# Patient Record
Sex: Female | Born: 1975 | Race: White | Hispanic: No | Marital: Married | Smoking: Never smoker
Health system: Southern US, Community
[De-identification: ages and names within clinical notes are randomized; demographics above are authoritative.]

## PROBLEM LIST (undated history)

## (undated) DIAGNOSIS — E0590 Thyrotoxicosis, unspecified without thyrotoxic crisis or storm: Secondary | ICD-10-CM

## (undated) DIAGNOSIS — E7800 Pure hypercholesterolemia, unspecified: Secondary | ICD-10-CM

## (undated) DIAGNOSIS — R112 Nausea with vomiting, unspecified: Secondary | ICD-10-CM

## (undated) DIAGNOSIS — K219 Gastro-esophageal reflux disease without esophagitis: Secondary | ICD-10-CM

## (undated) DIAGNOSIS — R7303 Prediabetes: Secondary | ICD-10-CM

## (undated) DIAGNOSIS — E039 Hypothyroidism, unspecified: Secondary | ICD-10-CM

## (undated) DIAGNOSIS — Z9889 Other specified postprocedural states: Secondary | ICD-10-CM

## (undated) DIAGNOSIS — F32A Depression, unspecified: Secondary | ICD-10-CM

## (undated) DIAGNOSIS — R233 Spontaneous ecchymoses: Secondary | ICD-10-CM

## (undated) DIAGNOSIS — M1990 Unspecified osteoarthritis, unspecified site: Secondary | ICD-10-CM

## (undated) DIAGNOSIS — G43909 Migraine, unspecified, not intractable, without status migrainosus: Secondary | ICD-10-CM

## (undated) DIAGNOSIS — K760 Fatty (change of) liver, not elsewhere classified: Secondary | ICD-10-CM

## (undated) DIAGNOSIS — F329 Major depressive disorder, single episode, unspecified: Secondary | ICD-10-CM

## (undated) DIAGNOSIS — E282 Polycystic ovarian syndrome: Secondary | ICD-10-CM

## (undated) DIAGNOSIS — R238 Other skin changes: Secondary | ICD-10-CM

## (undated) HISTORY — PX: CHOLECYSTECTOMY: SHX55

## (undated) HISTORY — DX: Polycystic ovarian syndrome: E28.2

## (undated) HISTORY — PX: DILATION AND CURETTAGE OF UTERUS: SHX78

## (undated) HISTORY — PX: ABLATION: SHX5711

## (undated) HISTORY — PX: EYE SURGERY: SHX253

## (undated) HISTORY — PX: CARPAL TUNNEL RELEASE: SHX101

## (undated) HISTORY — DX: Spontaneous ecchymoses: R23.3

## (undated) HISTORY — DX: Pure hypercholesterolemia, unspecified: E78.00

## (undated) HISTORY — DX: Other skin changes: R23.8

## (undated) HISTORY — PX: BACK SURGERY: SHX140

---
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
## 2007-04-11 HISTORY — PX: CHOLECYSTECTOMY: SHX55

## 2011-12-09 HISTORY — PX: DILATION AND CURETTAGE OF UTERUS: SHX78

## 2017-03-11 DIAGNOSIS — D242 Benign neoplasm of left breast: Secondary | ICD-10-CM | POA: Diagnosis not present

## 2017-03-11 DIAGNOSIS — N6323 Unspecified lump in the left breast, lower outer quadrant: Secondary | ICD-10-CM | POA: Diagnosis not present

## 2017-03-20 DIAGNOSIS — G43909 Migraine, unspecified, not intractable, without status migrainosus: Secondary | ICD-10-CM | POA: Diagnosis not present

## 2017-04-08 DIAGNOSIS — E039 Hypothyroidism, unspecified: Secondary | ICD-10-CM | POA: Diagnosis not present

## 2017-05-06 DIAGNOSIS — R197 Diarrhea, unspecified: Secondary | ICD-10-CM | POA: Diagnosis not present

## 2017-05-26 DIAGNOSIS — Z6841 Body Mass Index (BMI) 40.0 and over, adult: Secondary | ICD-10-CM | POA: Diagnosis not present

## 2017-05-26 DIAGNOSIS — N926 Irregular menstruation, unspecified: Secondary | ICD-10-CM | POA: Diagnosis not present

## 2017-06-03 DIAGNOSIS — D242 Benign neoplasm of left breast: Secondary | ICD-10-CM | POA: Diagnosis not present

## 2017-06-03 DIAGNOSIS — Z6841 Body Mass Index (BMI) 40.0 and over, adult: Secondary | ICD-10-CM | POA: Diagnosis not present

## 2017-06-16 DIAGNOSIS — G43009 Migraine without aura, not intractable, without status migrainosus: Secondary | ICD-10-CM | POA: Diagnosis not present

## 2017-07-02 DIAGNOSIS — N644 Mastodynia: Secondary | ICD-10-CM | POA: Diagnosis not present

## 2017-07-08 DIAGNOSIS — Z3202 Encounter for pregnancy test, result negative: Secondary | ICD-10-CM | POA: Diagnosis not present

## 2017-07-08 DIAGNOSIS — N926 Irregular menstruation, unspecified: Secondary | ICD-10-CM | POA: Diagnosis not present

## 2017-07-13 DIAGNOSIS — E039 Hypothyroidism, unspecified: Secondary | ICD-10-CM | POA: Diagnosis not present

## 2017-07-13 DIAGNOSIS — Z202 Contact with and (suspected) exposure to infections with a predominantly sexual mode of transmission: Secondary | ICD-10-CM | POA: Diagnosis not present

## 2017-07-15 DIAGNOSIS — N644 Mastodynia: Secondary | ICD-10-CM | POA: Diagnosis not present

## 2017-07-15 DIAGNOSIS — N6001 Solitary cyst of right breast: Secondary | ICD-10-CM | POA: Diagnosis not present

## 2017-07-15 DIAGNOSIS — N6011 Diffuse cystic mastopathy of right breast: Secondary | ICD-10-CM | POA: Diagnosis not present

## 2017-07-15 DIAGNOSIS — R928 Other abnormal and inconclusive findings on diagnostic imaging of breast: Secondary | ICD-10-CM | POA: Diagnosis not present

## 2017-07-23 DIAGNOSIS — Z09 Encounter for follow-up examination after completed treatment for conditions other than malignant neoplasm: Secondary | ICD-10-CM | POA: Diagnosis not present

## 2017-07-23 DIAGNOSIS — N926 Irregular menstruation, unspecified: Secondary | ICD-10-CM | POA: Diagnosis not present

## 2017-08-01 DIAGNOSIS — G43919 Migraine, unspecified, intractable, without status migrainosus: Secondary | ICD-10-CM | POA: Diagnosis not present

## 2017-08-18 DIAGNOSIS — D519 Vitamin B12 deficiency anemia, unspecified: Secondary | ICD-10-CM | POA: Diagnosis not present

## 2017-08-18 DIAGNOSIS — E559 Vitamin D deficiency, unspecified: Secondary | ICD-10-CM | POA: Diagnosis not present

## 2017-08-18 DIAGNOSIS — R5383 Other fatigue: Secondary | ICD-10-CM | POA: Diagnosis not present

## 2017-09-14 DIAGNOSIS — M25571 Pain in right ankle and joints of right foot: Secondary | ICD-10-CM | POA: Diagnosis not present

## 2017-10-08 DIAGNOSIS — E039 Hypothyroidism, unspecified: Secondary | ICD-10-CM | POA: Diagnosis not present

## 2017-10-08 DIAGNOSIS — E559 Vitamin D deficiency, unspecified: Secondary | ICD-10-CM | POA: Diagnosis not present

## 2017-10-10 DIAGNOSIS — M545 Low back pain: Secondary | ICD-10-CM | POA: Diagnosis not present

## 2017-10-12 DIAGNOSIS — M545 Low back pain: Secondary | ICD-10-CM | POA: Diagnosis not present

## 2017-10-12 DIAGNOSIS — M25552 Pain in left hip: Secondary | ICD-10-CM | POA: Diagnosis not present

## 2017-10-12 DIAGNOSIS — E039 Hypothyroidism, unspecified: Secondary | ICD-10-CM | POA: Diagnosis not present

## 2017-10-12 DIAGNOSIS — Z9049 Acquired absence of other specified parts of digestive tract: Secondary | ICD-10-CM | POA: Diagnosis not present

## 2017-10-12 DIAGNOSIS — M7072 Other bursitis of hip, left hip: Secondary | ICD-10-CM | POA: Diagnosis not present

## 2017-10-12 DIAGNOSIS — Z79899 Other long term (current) drug therapy: Secondary | ICD-10-CM | POA: Diagnosis not present

## 2017-10-12 DIAGNOSIS — M5137 Other intervertebral disc degeneration, lumbosacral region: Secondary | ICD-10-CM | POA: Diagnosis not present

## 2017-10-12 DIAGNOSIS — Z7984 Long term (current) use of oral hypoglycemic drugs: Secondary | ICD-10-CM | POA: Diagnosis not present

## 2017-10-12 DIAGNOSIS — E282 Polycystic ovarian syndrome: Secondary | ICD-10-CM | POA: Diagnosis not present

## 2017-10-12 DIAGNOSIS — M5442 Lumbago with sciatica, left side: Secondary | ICD-10-CM | POA: Diagnosis not present

## 2017-10-13 DIAGNOSIS — M5432 Sciatica, left side: Secondary | ICD-10-CM | POA: Diagnosis not present

## 2017-10-13 DIAGNOSIS — M545 Low back pain: Secondary | ICD-10-CM | POA: Diagnosis not present

## 2017-10-14 DIAGNOSIS — S338XXA Sprain of other parts of lumbar spine and pelvis, initial encounter: Secondary | ICD-10-CM | POA: Diagnosis not present

## 2017-10-14 DIAGNOSIS — S134XXA Sprain of ligaments of cervical spine, initial encounter: Secondary | ICD-10-CM | POA: Diagnosis not present

## 2017-10-14 DIAGNOSIS — S233XXA Sprain of ligaments of thoracic spine, initial encounter: Secondary | ICD-10-CM | POA: Diagnosis not present

## 2017-10-15 DIAGNOSIS — S134XXA Sprain of ligaments of cervical spine, initial encounter: Secondary | ICD-10-CM | POA: Diagnosis not present

## 2017-10-15 DIAGNOSIS — S338XXA Sprain of other parts of lumbar spine and pelvis, initial encounter: Secondary | ICD-10-CM | POA: Diagnosis not present

## 2017-10-15 DIAGNOSIS — S233XXA Sprain of ligaments of thoracic spine, initial encounter: Secondary | ICD-10-CM | POA: Diagnosis not present

## 2017-10-16 DIAGNOSIS — S338XXA Sprain of other parts of lumbar spine and pelvis, initial encounter: Secondary | ICD-10-CM | POA: Diagnosis not present

## 2017-10-16 DIAGNOSIS — S233XXA Sprain of ligaments of thoracic spine, initial encounter: Secondary | ICD-10-CM | POA: Diagnosis not present

## 2017-10-16 DIAGNOSIS — S134XXA Sprain of ligaments of cervical spine, initial encounter: Secondary | ICD-10-CM | POA: Diagnosis not present

## 2017-10-19 DIAGNOSIS — S134XXA Sprain of ligaments of cervical spine, initial encounter: Secondary | ICD-10-CM | POA: Diagnosis not present

## 2017-10-19 DIAGNOSIS — S233XXA Sprain of ligaments of thoracic spine, initial encounter: Secondary | ICD-10-CM | POA: Diagnosis not present

## 2017-10-19 DIAGNOSIS — S338XXA Sprain of other parts of lumbar spine and pelvis, initial encounter: Secondary | ICD-10-CM | POA: Diagnosis not present

## 2017-10-20 DIAGNOSIS — Z79899 Other long term (current) drug therapy: Secondary | ICD-10-CM | POA: Diagnosis not present

## 2017-10-20 DIAGNOSIS — M5442 Lumbago with sciatica, left side: Secondary | ICD-10-CM | POA: Diagnosis not present

## 2017-10-20 DIAGNOSIS — M5126 Other intervertebral disc displacement, lumbar region: Secondary | ICD-10-CM | POA: Diagnosis not present

## 2017-10-20 DIAGNOSIS — M5106 Intervertebral disc disorders with myelopathy, lumbar region: Secondary | ICD-10-CM | POA: Diagnosis not present

## 2017-10-20 DIAGNOSIS — M545 Low back pain: Secondary | ICD-10-CM | POA: Diagnosis not present

## 2017-10-20 DIAGNOSIS — E039 Hypothyroidism, unspecified: Secondary | ICD-10-CM | POA: Diagnosis not present

## 2017-10-20 DIAGNOSIS — M5432 Sciatica, left side: Secondary | ICD-10-CM | POA: Diagnosis not present

## 2017-10-20 DIAGNOSIS — R52 Pain, unspecified: Secondary | ICD-10-CM | POA: Diagnosis not present

## 2017-10-20 DIAGNOSIS — Z7984 Long term (current) use of oral hypoglycemic drugs: Secondary | ICD-10-CM | POA: Diagnosis not present

## 2017-10-22 ENCOUNTER — Ambulatory Visit (HOSPITAL_COMMUNITY): Payer: BLUE CROSS/BLUE SHIELD | Admitting: Certified Registered Nurse Anesthetist

## 2017-10-22 ENCOUNTER — Other Ambulatory Visit: Payer: Self-pay | Admitting: Neurosurgery

## 2017-10-22 ENCOUNTER — Observation Stay (HOSPITAL_COMMUNITY)
Admission: AD | Admit: 2017-10-22 | Discharge: 2017-10-23 | DRG: 519 | Disposition: A | Discharge status: Home / Self Care | Payer: BLUE CROSS/BLUE SHIELD | Attending: Neurosurgery | Admitting: Neurosurgery

## 2017-10-22 ENCOUNTER — Encounter (HOSPITAL_COMMUNITY): Payer: Self-pay

## 2017-10-22 ENCOUNTER — Ambulatory Visit (HOSPITAL_COMMUNITY): Payer: BLUE CROSS/BLUE SHIELD

## 2017-10-22 ENCOUNTER — Encounter (HOSPITAL_COMMUNITY)
Admission: AD | Disposition: A | Discharge status: Home / Self Care | Payer: Self-pay | Source: Home / Self Care | Attending: Neurosurgery

## 2017-10-22 DIAGNOSIS — M48061 Spinal stenosis, lumbar region without neurogenic claudication: Secondary | ICD-10-CM | POA: Insufficient documentation

## 2017-10-22 DIAGNOSIS — E1122 Type 2 diabetes mellitus with diabetic chronic kidney disease: Secondary | ICD-10-CM | POA: Diagnosis not present

## 2017-10-22 DIAGNOSIS — Z79890 Hormone replacement therapy (postmenopausal): Secondary | ICD-10-CM | POA: Insufficient documentation

## 2017-10-22 DIAGNOSIS — Z7952 Long term (current) use of systemic steroids: Secondary | ICD-10-CM | POA: Insufficient documentation

## 2017-10-22 DIAGNOSIS — E669 Obesity, unspecified: Secondary | ICD-10-CM | POA: Diagnosis not present

## 2017-10-22 DIAGNOSIS — Z7984 Long term (current) use of oral hypoglycemic drugs: Secondary | ICD-10-CM | POA: Diagnosis not present

## 2017-10-22 DIAGNOSIS — E119 Type 2 diabetes mellitus without complications: Secondary | ICD-10-CM | POA: Insufficient documentation

## 2017-10-22 DIAGNOSIS — Z79899 Other long term (current) drug therapy: Secondary | ICD-10-CM | POA: Diagnosis not present

## 2017-10-22 DIAGNOSIS — Z79891 Long term (current) use of opiate analgesic: Secondary | ICD-10-CM | POA: Diagnosis not present

## 2017-10-22 DIAGNOSIS — F329 Major depressive disorder, single episode, unspecified: Secondary | ICD-10-CM | POA: Diagnosis not present

## 2017-10-22 DIAGNOSIS — M5126 Other intervertebral disc displacement, lumbar region: Secondary | ICD-10-CM | POA: Diagnosis not present

## 2017-10-22 DIAGNOSIS — M5106 Intervertebral disc disorders with myelopathy, lumbar region: Secondary | ICD-10-CM | POA: Diagnosis not present

## 2017-10-22 DIAGNOSIS — E039 Hypothyroidism, unspecified: Secondary | ICD-10-CM | POA: Insufficient documentation

## 2017-10-22 DIAGNOSIS — G834 Cauda equina syndrome: Secondary | ICD-10-CM | POA: Diagnosis not present

## 2017-10-22 DIAGNOSIS — Z981 Arthrodesis status: Secondary | ICD-10-CM | POA: Diagnosis not present

## 2017-10-22 DIAGNOSIS — Z888 Allergy status to other drugs, medicaments and biological substances status: Secondary | ICD-10-CM | POA: Diagnosis not present

## 2017-10-22 DIAGNOSIS — Z6841 Body Mass Index (BMI) 40.0 and over, adult: Secondary | ICD-10-CM | POA: Diagnosis not present

## 2017-10-22 DIAGNOSIS — G8929 Other chronic pain: Secondary | ICD-10-CM | POA: Diagnosis not present

## 2017-10-22 DIAGNOSIS — Z419 Encounter for procedure for purposes other than remedying health state, unspecified: Secondary | ICD-10-CM

## 2017-10-22 HISTORY — PX: LUMBAR LAMINECTOMY/DECOMPRESSION MICRODISCECTOMY: SHX5026

## 2017-10-22 HISTORY — DX: Major depressive disorder, single episode, unspecified: F32.9

## 2017-10-22 HISTORY — DX: Depression, unspecified: F32.A

## 2017-10-22 HISTORY — DX: Fatty (change of) liver, not elsewhere classified: K76.0

## 2017-10-22 HISTORY — DX: Other specified postprocedural states: R11.2

## 2017-10-22 HISTORY — DX: Nausea with vomiting, unspecified: Z98.890

## 2017-10-22 LAB — BASIC METABOLIC PANEL
Anion gap: 9 (ref 5–15)
BUN: 21 mg/dL — ABNORMAL HIGH (ref 6–20)
CO2: 21 mmol/L — ABNORMAL LOW (ref 22–32)
Calcium: 8.8 mg/dL — ABNORMAL LOW (ref 8.9–10.3)
Chloride: 107 mmol/L (ref 98–111)
Creatinine, Ser: 0.68 mg/dL (ref 0.44–1.00)
GFR calc Af Amer: >60 mL/min (ref >60)
GFR calc non Af Amer: >60 mL/min (ref >60)
Glucose, Bld: 112 mg/dL — ABNORMAL HIGH (ref 70–99)
Potassium: 4.5 mmol/L (ref 3.5–5.1)
Sodium: 137 mmol/L (ref 135–145)

## 2017-10-22 LAB — CBC
HCT: 46.7 % — ABNORMAL HIGH (ref 36.0–46.0)
Hemoglobin: 15 g/dL (ref 12.0–15.0)
MCH: 30.1 pg (ref 26.0–34.0)
MCHC: 32.1 g/dL (ref 30.0–36.0)
MCV: 93.8 fL (ref 78.0–100.0)
Platelets: 306 10*3/uL (ref 150–400)
RBC: 4.98 MIL/uL (ref 3.87–5.11)
RDW: 12.8 % (ref 11.5–15.5)
WBC: 10.2 10*3/uL (ref 4.0–10.5)

## 2017-10-22 LAB — POCT PREGNANCY, URINE: Preg Test, Ur: NEGATIVE

## 2017-10-22 SURGERY — LUMBAR LAMINECTOMY/DECOMPRESSION MICRODISCECTOMY 1 LEVEL
Anesthesia: General | Site: Back | Laterality: Left

## 2017-10-22 MED ORDER — PROPOFOL 10 MG/ML IV BOLUS
INTRAVENOUS | Status: AC
  Filled 2017-10-22: qty 20

## 2017-10-22 MED ORDER — METHOCARBAMOL 1000 MG/10ML IJ SOLN
500.0000 mg | Freq: Four times a day (QID) | INTRAVENOUS | Status: DC | PRN
  Filled 2017-10-22: qty 5

## 2017-10-22 MED ORDER — SUGAMMADEX SODIUM 200 MG/2ML IV SOLN
INTRAVENOUS | Status: DC | PRN
  Administered 2017-10-22: 400 mg via INTRAVENOUS

## 2017-10-22 MED ORDER — SODIUM CHLORIDE 0.9% FLUSH: 3.0000 mL | INTRAVENOUS | Status: DC | PRN

## 2017-10-22 MED ORDER — GABAPENTIN 300 MG PO CAPS
300.0000 mg | ORAL_CAPSULE | Freq: Three times a day (TID) | ORAL | Status: DC
  Administered 2017-10-22 – 2017-10-23 (×2): 300 mg via ORAL
  Filled 2017-10-22 (×2): qty 1

## 2017-10-22 MED ORDER — LIDOCAINE 2% (20 MG/ML) 5 ML SYRINGE
INTRAMUSCULAR | Status: AC
  Filled 2017-10-22: qty 5

## 2017-10-22 MED ORDER — PROMETHAZINE HCL 25 MG/ML IJ SOLN: 6.2500 mg | INTRAMUSCULAR | Status: DC | PRN

## 2017-10-22 MED ORDER — BISACODYL 10 MG RE SUPP: 10.0000 mg | Freq: Every day | RECTAL | Status: DC | PRN

## 2017-10-22 MED ORDER — THROMBIN 5000 UNITS EX SOLR
CUTANEOUS | Status: DC | PRN
  Administered 2017-10-22 (×2): 5000 [IU] via TOPICAL

## 2017-10-22 MED ORDER — ACETAMINOPHEN 325 MG PO TABS: 650.0000 mg | ORAL_TABLET | ORAL | Status: DC | PRN

## 2017-10-22 MED ORDER — VANCOMYCIN HCL 10 G IV SOLR
1250.0000 mg | Freq: Once | INTRAVENOUS | Status: DC
  Filled 2017-10-22: qty 1250

## 2017-10-22 MED ORDER — OXYCODONE HCL 5 MG PO TABS
5.0000 mg | ORAL_TABLET | ORAL | Status: DC | PRN
  Administered 2017-10-23: 5 mg via ORAL
  Filled 2017-10-22: qty 1

## 2017-10-22 MED ORDER — ONDANSETRON HCL 4 MG/2ML IJ SOLN
INTRAMUSCULAR | Status: DC | PRN
  Administered 2017-10-22: 4 mg via INTRAVENOUS

## 2017-10-22 MED ORDER — SODIUM CHLORIDE 0.9 % IV SOLN: 250.0000 mL | INTRAVENOUS | Status: DC

## 2017-10-22 MED ORDER — LACTATED RINGERS IV SOLN
INTRAVENOUS | Status: DC | PRN
  Administered 2017-10-22 (×2): via INTRAVENOUS

## 2017-10-22 MED ORDER — HEMOSTATIC AGENTS (NO CHARGE) OPTIME
TOPICAL | Status: DC | PRN
  Administered 2017-10-22: 1 via TOPICAL

## 2017-10-22 MED ORDER — MENTHOL 3 MG MT LOZG
1.0000 | LOZENGE | OROMUCOSAL | Status: DC | PRN
  Filled 2017-10-22: qty 9

## 2017-10-22 MED ORDER — ROCURONIUM BROMIDE 100 MG/10ML IV SOLN
INTRAVENOUS | Status: DC | PRN
  Administered 2017-10-22: 20 mg via INTRAVENOUS
  Administered 2017-10-22: 60 mg via INTRAVENOUS

## 2017-10-22 MED ORDER — SODIUM CHLORIDE 0.9% FLUSH
3.0000 mL | Freq: Two times a day (BID) | INTRAVENOUS | Status: DC
  Administered 2017-10-22: 3 mL via INTRAVENOUS

## 2017-10-22 MED ORDER — ONDANSETRON HCL 4 MG/2ML IJ SOLN
INTRAMUSCULAR | Status: AC
  Filled 2017-10-22: qty 2

## 2017-10-22 MED ORDER — THROMBIN 5000 UNITS EX SOLR
OROMUCOSAL | Status: DC | PRN
  Administered 2017-10-22: 19:00:00 via TOPICAL

## 2017-10-22 MED ORDER — METHYLPREDNISOLONE ACETATE 80 MG/ML IJ SUSP
INTRAMUSCULAR | Status: DC | PRN
  Administered 2017-10-22: 80 mg

## 2017-10-22 MED ORDER — DOCUSATE SODIUM 100 MG PO CAPS
100.0000 mg | ORAL_CAPSULE | Freq: Two times a day (BID) | ORAL | Status: DC
  Administered 2017-10-22: 100 mg via ORAL
  Filled 2017-10-22: qty 1

## 2017-10-22 MED ORDER — ACETAMINOPHEN 650 MG RE SUPP: 650.0000 mg | RECTAL | Status: DC | PRN

## 2017-10-22 MED ORDER — SODIUM CHLORIDE 0.9 % IV SOLN
INTRAVENOUS | Status: DC | PRN
  Administered 2017-10-22: 500 mL

## 2017-10-22 MED ORDER — SODIUM CHLORIDE 0.9 % IV SOLN: INTRAVENOUS | Status: DC

## 2017-10-22 MED ORDER — VANCOMYCIN HCL 10 G IV SOLR
1500.0000 mg | INTRAVENOUS | Status: AC
  Administered 2017-10-22: 1500 mg via INTRAVENOUS
  Filled 2017-10-22: qty 1500

## 2017-10-22 MED ORDER — ONDANSETRON HCL 4 MG PO TABS: 4.0000 mg | ORAL_TABLET | Freq: Four times a day (QID) | ORAL | Status: DC | PRN

## 2017-10-22 MED ORDER — HYDROMORPHONE HCL 1 MG/ML IJ SOLN
INTRAMUSCULAR | Status: DC | PRN
  Administered 2017-10-22: 0.5 mg via INTRAVENOUS

## 2017-10-22 MED ORDER — BUPIVACAINE HCL (PF) 0.5 % IJ SOLN
INTRAMUSCULAR | Status: DC | PRN
  Administered 2017-10-22: 5 mL

## 2017-10-22 MED ORDER — FENTANYL CITRATE (PF) 250 MCG/5ML IJ SOLN
INTRAMUSCULAR | Status: AC
  Filled 2017-10-22: qty 5

## 2017-10-22 MED ORDER — HYDROMORPHONE HCL 1 MG/ML IJ SOLN: 0.5000 mg | INTRAMUSCULAR | Status: DC | PRN

## 2017-10-22 MED ORDER — SENNOSIDES-DOCUSATE SODIUM 8.6-50 MG PO TABS: 1.0000 | ORAL_TABLET | Freq: Every evening | ORAL | Status: DC | PRN

## 2017-10-22 MED ORDER — HYDROMORPHONE HCL 1 MG/ML IJ SOLN
INTRAMUSCULAR | Status: AC
Start: 2017-10-22 — End: ?
  Filled 2017-10-22: qty 0.5

## 2017-10-22 MED ORDER — PROPOFOL 10 MG/ML IV BOLUS
INTRAVENOUS | Status: DC | PRN
  Administered 2017-10-22: 140 mg via INTRAVENOUS
  Administered 2017-10-22: 20 mg via INTRAVENOUS

## 2017-10-22 MED ORDER — HYDROCODONE-ACETAMINOPHEN 5-325 MG PO TABS: 1.0000 | ORAL_TABLET | ORAL | Status: DC | PRN

## 2017-10-22 MED ORDER — ONDANSETRON HCL 4 MG/2ML IJ SOLN: 4.0000 mg | Freq: Four times a day (QID) | INTRAMUSCULAR | Status: DC | PRN

## 2017-10-22 MED ORDER — METHOCARBAMOL 500 MG PO TABS
500.0000 mg | ORAL_TABLET | Freq: Four times a day (QID) | ORAL | Status: DC | PRN
  Administered 2017-10-23: 500 mg via ORAL
  Filled 2017-10-22: qty 1

## 2017-10-22 MED ORDER — LACTATED RINGERS IV SOLN: INTRAVENOUS | Status: DC

## 2017-10-22 MED ORDER — HYDROMORPHONE HCL 1 MG/ML IJ SOLN: 0.2500 mg | INTRAMUSCULAR | Status: DC | PRN

## 2017-10-22 MED ORDER — DEXTROSE 5 % IV SOLN
3.0000 g | INTRAVENOUS | Status: DC
  Filled 2017-10-22: qty 3000

## 2017-10-22 MED ORDER — MIDAZOLAM HCL 2 MG/2ML IJ SOLN
INTRAMUSCULAR | Status: AC
  Filled 2017-10-22: qty 2

## 2017-10-22 MED ORDER — MIDAZOLAM HCL 2 MG/2ML IJ SOLN
INTRAMUSCULAR | Status: DC | PRN
  Administered 2017-10-22: 2 mg via INTRAVENOUS

## 2017-10-22 MED ORDER — LIDOCAINE HCL (CARDIAC) PF 100 MG/5ML IV SOSY
PREFILLED_SYRINGE | INTRAVENOUS | Status: DC | PRN
  Administered 2017-10-22: 70 mg via INTRATRACHEAL

## 2017-10-22 MED ORDER — DEXAMETHASONE SODIUM PHOSPHATE 10 MG/ML IJ SOLN
INTRAMUSCULAR | Status: DC | PRN
  Administered 2017-10-22: 10 mg via INTRAVENOUS

## 2017-10-22 MED ORDER — THROMBIN 5000 UNITS EX SOLR
CUTANEOUS | Status: AC
Start: 2017-10-22 — End: ?
  Filled 2017-10-22: qty 15000

## 2017-10-22 MED ORDER — BUPIVACAINE HCL (PF) 0.5 % IJ SOLN
INTRAMUSCULAR | Status: AC
  Filled 2017-10-22: qty 30

## 2017-10-22 MED ORDER — ROCURONIUM BROMIDE 50 MG/5ML IV SOSY
PREFILLED_SYRINGE | INTRAVENOUS | Status: AC
  Filled 2017-10-22: qty 10

## 2017-10-22 MED ORDER — 0.9 % SODIUM CHLORIDE (POUR BTL) OPTIME
TOPICAL | Status: DC | PRN
  Administered 2017-10-22: 1000 mL

## 2017-10-22 MED ORDER — LIDOCAINE-EPINEPHRINE 1 %-1:100000 IJ SOLN
INTRAMUSCULAR | Status: AC
  Filled 2017-10-22: qty 1

## 2017-10-22 MED ORDER — METHYLPREDNISOLONE ACETATE 80 MG/ML IJ SUSP
INTRAMUSCULAR | Status: AC
  Filled 2017-10-22: qty 1

## 2017-10-22 MED ORDER — LIDOCAINE-EPINEPHRINE 1 %-1:100000 IJ SOLN
INTRAMUSCULAR | Status: DC | PRN
  Administered 2017-10-22: 5 mL via INTRADERMAL

## 2017-10-22 MED ORDER — PHENOL 1.4 % MT LIQD: 1.0000 | OROMUCOSAL | Status: DC | PRN

## 2017-10-22 MED ORDER — MEPERIDINE HCL 50 MG/ML IJ SOLN: 6.2500 mg | INTRAMUSCULAR | Status: DC | PRN

## 2017-10-22 MED ORDER — SENNA 8.6 MG PO TABS
1.0000 | ORAL_TABLET | Freq: Two times a day (BID) | ORAL | Status: DC
  Administered 2017-10-22: 8.6 mg via ORAL
  Filled 2017-10-22: qty 1

## 2017-10-22 MED ORDER — ACETAMINOPHEN 500 MG PO TABS
1000.0000 mg | ORAL_TABLET | Freq: Four times a day (QID) | ORAL | Status: DC
  Administered 2017-10-22 – 2017-10-23 (×2): 1000 mg via ORAL
  Filled 2017-10-22 (×2): qty 2

## 2017-10-22 MED ORDER — FLEET ENEMA 7-19 GM/118ML RE ENEM: 1.0000 | ENEMA | Freq: Once | RECTAL | Status: DC | PRN

## 2017-10-22 MED ORDER — FENTANYL CITRATE (PF) 250 MCG/5ML IJ SOLN
INTRAMUSCULAR | Status: DC | PRN
  Administered 2017-10-22: 150 ug via INTRAVENOUS
  Administered 2017-10-22 (×2): 50 ug via INTRAVENOUS

## 2017-10-22 MED ORDER — SCOPOLAMINE 1 MG/3DAYS TD PT72
MEDICATED_PATCH | TRANSDERMAL | Status: DC | PRN
  Administered 2017-10-22: 1 via TRANSDERMAL

## 2017-10-22 MED ORDER — DEXAMETHASONE SODIUM PHOSPHATE 10 MG/ML IJ SOLN
INTRAMUSCULAR | Status: AC
  Filled 2017-10-22: qty 1

## 2017-10-22 SURGICAL SUPPLY — 61 items
BAG DECANTER FOR FLEXI CONT (MISCELLANEOUS) ×2 IMPLANT
BENZOIN TINCTURE PRP APPL 2/3 (GAUZE/BANDAGES/DRESSINGS) IMPLANT
BLADE CLIPPER SURG (BLADE) IMPLANT
BLADE SURG 11 STRL SS (BLADE) ×2 IMPLANT
BUR MATCHSTICK NEURO 3.0 LAGG (BURR) ×2 IMPLANT
BUR PRECISION FLUTE 5.0 (BURR) ×2 IMPLANT
CANISTER SUCT 3000ML PPV (MISCELLANEOUS) ×2 IMPLANT
CARTRIDGE OIL MAESTRO DRILL (MISCELLANEOUS) ×1 IMPLANT
DECANTER SPIKE VIAL GLASS SM (MISCELLANEOUS) ×2 IMPLANT
DERMABOND ADVANCED (GAUZE/BANDAGES/DRESSINGS) ×1
DERMABOND ADVANCED .7 DNX12 (GAUZE/BANDAGES/DRESSINGS) ×1 IMPLANT
DIFFUSER DRILL AIR PNEUMATIC (MISCELLANEOUS) ×2 IMPLANT
DRAPE LAPAROTOMY 100X72X124 (DRAPES) ×2 IMPLANT
DRAPE MICROSCOPE LEICA (MISCELLANEOUS) ×2 IMPLANT
DRAPE SURG 17X23 STRL (DRAPES) ×2 IMPLANT
DRSG OPSITE POSTOP 3X4 (GAUZE/BANDAGES/DRESSINGS) ×2 IMPLANT
DRSG OPSITE POSTOP 4X6 (GAUZE/BANDAGES/DRESSINGS) ×2 IMPLANT
DURAPREP 26ML APPLICATOR (WOUND CARE) ×2 IMPLANT
ELECT REM PT RETURN 9FT ADLT (ELECTROSURGICAL) ×2
ELECTRODE REM PT RTRN 9FT ADLT (ELECTROSURGICAL) ×1 IMPLANT
GAUZE 4X4 16PLY RFD (DISPOSABLE) IMPLANT
GAUZE SPONGE 4X4 12PLY STRL (GAUZE/BANDAGES/DRESSINGS) IMPLANT
GLOVE BIO SURGEON STRL SZ 6.5 (GLOVE) ×4 IMPLANT
GLOVE BIO SURGEON STRL SZ7 (GLOVE) ×2 IMPLANT
GLOVE BIOGEL PI IND STRL 6.5 (GLOVE) ×1 IMPLANT
GLOVE BIOGEL PI IND STRL 7.0 (GLOVE) IMPLANT
GLOVE BIOGEL PI IND STRL 7.5 (GLOVE) ×1 IMPLANT
GLOVE BIOGEL PI INDICATOR 6.5 (GLOVE) ×1
GLOVE BIOGEL PI INDICATOR 7.0 (GLOVE)
GLOVE BIOGEL PI INDICATOR 7.5 (GLOVE) ×1
GLOVE ECLIPSE 7.0 STRL STRAW (GLOVE) ×2 IMPLANT
GLOVE EXAM NITRILE LRG STRL (GLOVE) IMPLANT
GLOVE EXAM NITRILE XL STR (GLOVE) IMPLANT
GLOVE EXAM NITRILE XS STR PU (GLOVE) IMPLANT
GOWN STRL REUS W/ TWL LRG LVL3 (GOWN DISPOSABLE) ×2 IMPLANT
GOWN STRL REUS W/ TWL XL LVL3 (GOWN DISPOSABLE) IMPLANT
GOWN STRL REUS W/TWL 2XL LVL3 (GOWN DISPOSABLE) IMPLANT
GOWN STRL REUS W/TWL LRG LVL3 (GOWN DISPOSABLE) ×2
GOWN STRL REUS W/TWL XL LVL3 (GOWN DISPOSABLE)
HEMOSTAT POWDER KIT SURGIFOAM (HEMOSTASIS) ×2 IMPLANT
KIT BASIN OR (CUSTOM PROCEDURE TRAY) ×2 IMPLANT
KIT TURNOVER KIT B (KITS) ×2 IMPLANT
NEEDLE HYPO 18GX1.5 BLUNT FILL (NEEDLE) IMPLANT
NEEDLE HYPO 22GX1.5 SAFETY (NEEDLE) ×2 IMPLANT
NEEDLE SPNL 18GX3.5 QUINCKE PK (NEEDLE) ×2 IMPLANT
NS IRRIG 1000ML POUR BTL (IV SOLUTION) ×2 IMPLANT
OIL CARTRIDGE MAESTRO DRILL (MISCELLANEOUS) ×2
PACK LAMINECTOMY NEURO (CUSTOM PROCEDURE TRAY) ×2 IMPLANT
PAD ARMBOARD 7.5X6 YLW CONV (MISCELLANEOUS) ×6 IMPLANT
RUBBERBAND STERILE (MISCELLANEOUS) ×4 IMPLANT
SPONGE LAP 4X18 RFD (DISPOSABLE) IMPLANT
SPONGE SURGIFOAM ABS GEL SZ50 (HEMOSTASIS) ×2 IMPLANT
STRIP CLOSURE SKIN 1/2X4 (GAUZE/BANDAGES/DRESSINGS) IMPLANT
SUT VIC AB 0 CT1 18XCR BRD8 (SUTURE) ×2 IMPLANT
SUT VIC AB 0 CT1 8-18 (SUTURE) ×2
SUT VIC AB 2-0 CT1 18 (SUTURE) IMPLANT
SUT VICRYL 3-0 RB1 18 ABS (SUTURE) ×4 IMPLANT
SYR 3ML LL SCALE MARK (SYRINGE) IMPLANT
TOWEL GREEN STERILE (TOWEL DISPOSABLE) ×2 IMPLANT
TOWEL GREEN STERILE FF (TOWEL DISPOSABLE) ×2 IMPLANT
WATER STERILE IRR 1000ML POUR (IV SOLUTION) ×2 IMPLANT

## 2017-10-22 NOTE — Anesthesia Procedure Notes (Signed)
Procedure Name: Intubation Date/Time: 10/22/2017 7:05 PM Performed by: Teressa Lower., CRNA Pre-anesthesia Checklist: Patient identified, Emergency Drugs available, Suction available and Patient being monitored Patient Re-evaluated:Patient Re-evaluated prior to induction Oxygen Delivery Method: Circle system utilized Preoxygenation: Pre-oxygenation with 100% oxygen Induction Type: IV induction Ventilation: Mask ventilation without difficulty Laryngoscope Size: Miller and 2 Grade View: Grade I Tube type: Oral Tube size: 7.0 mm Number of attempts: 1 Airway Equipment and Method: Stylet Placement Confirmation: ETT inserted through vocal cords under direct vision,  positive ETCO2 and breath sounds checked- equal and bilateral Secured at: 22 cm Tube secured with: Tape Dental Injury: Teeth and Oropharynx as per pre-operative assessment  Comments: Used blankets for ramping position

## 2017-10-22 NOTE — Progress Notes (Signed)
Patient unable to remove earring from left ear. Anesthesia made aware. Patient accepts the risks of leaving the earring in her ear.

## 2017-10-22 NOTE — H&P (Signed)
Chief Complaint   Low back pain  HPI   HPI: Amber Moss is a 42 y.o. female who presents with acute low back pain with left-sided sciatica.  Symptoms started several weeks ago without known cause.  She has a history of chronic low back pain secondary to degenerative disc disease.  For her current symptoms, she has been prescribed steroids, muscle relaxers and anti-inflammatories without relief.  She has also been to a chiropractor without relief.  She endorses numbness in the L4-5 dermatome.  She states when she goes use the restroom it takes her minute to begin to urinate.  She does also describe the sensation of incomplete emptying of her bladder.  She denies saddle anesthesia or fecal/urinary incontinence. She is healthy otherwise with the exception of hypothyroidism.  She does not take any blood thinners on a consistent basis including aspirin.   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 She presents today for surgery.  She is without any concerns.   Patient Active Problem List   Diagnosis Date Noted  . HNP (herniated nucleus pulposus), lumbar 10/22/2017    PMH: No past medical history on file.  PSH:  No medications prior to admission.    SH: Social History   Tobacco Use  . Smoking status: Not on file  Substance Use Topics  . Alcohol use: Not on file  . Drug use: Not on file    MEDS: Prior to Admission medications   Not on File    ALLERGY: Allergies not on file  Social History   Tobacco Use  . Smoking status: Not on file  Substance Use Topics  . Alcohol use: Not on file     No family history on file.   ROS   ROS  Exam   There were no vitals filed for this visit. General appearance: WDWN, NAD Eyes: PERRL, Fundoscopic: normal Cardiovascular: Regular rate and rhythm without murmurs, rubs, gallops. No edema or variciosities. Distal pulses normal. Pulmonary: Clear to auscultation Musculoskeletal:     Muscle  tone upper extremities: Normal    Muscle tone lower extremities: Normal    Motor exam:  Right Left Hip Flexor:  4+/5 Knee Extensor:  4+/5 Knee Flexor:  4+/5 Tib Anterior:  4/5 EHL:  4/5 Medial Gastroc:  4-/5  Neurological Awake, alert, oriented Memory and concentration grossly intact Speech fluent, appropriate CNII: Visual fields normal CNIII/IV/VI: EOMI CNV: Facial sensation normal CNVII: Symmetric, normal strength CNVIII: Grossly normal CNIX: Normal palate movement CNXI: Trap and SCM strength normal CN XII: Tongue protrusion normal   Decreased sensation to pain along distal L4, L5 and S1 dermatomes. DTR: decreased patella/achilles bilaterally Coordination (finger/nose & heel/shin): Normal  Results - Imaging/Labs   MRI of the lumbar spine dated 10/20/2017 was reviewed.  This demonstrates bilateral pars defect at L4, with disc degeneration and reactive endplate changes.  There is a large central to left eccentric inferiorly migrated disc fragment which essentially spans the length of the L5 vertebral body and causes significant left-sided lateral recess stenosis at both L4-5 and L5-S1, and significant central stenosis behind the body of L5.   Impression/Plan   42 y.o. female with symptoms of incomplete cauda equina syndrome including both plantar and dorsiflexion weakness and subjective symptoms of urinary hesitancy and retention.  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 given her symptoms of cauda equina syndrome,   It was recommended that  she undergo urgent decompression.  We will plan on proceeding with left-sided L5 laminotomy and microdiskectomy.   while in the office, risks, benefits and alternatives to surgery were discussed.  Patient states in  Own language understanding of the procedure and wishes to proceed.  All questions answered.  Consent has been signed.

## 2017-10-22 NOTE — Op Note (Signed)
NEUROSURGERY OPERATIVE NOTE   PREOP DIAGNOSIS:  1. Lumbar disc herniation, L4-5 2. Cauda Equina Syndrome  POSTOP DIAGNOSIS: Same  PROCEDURE: 1. Left L5 laminotomy and microdiscectomy for decompression of thecal sac and nerve roots 2. Use of operating microscope  SURGEON: Dr. Consuella Lose, MD  ASSISTANT: Ferne Reus, PA-C  ANESTHESIA: General Endotracheal  EBL: 100cc  SPECIMENS: None  DRAINS: None  COMPLICATIONS: None   CONDITION: Hemodynamically stable to PACU  HISTORY: Amber Moss is a 42 y.o. female initially presenting to the outpatient neurosurgery clinic as an urgent referral for acute onset of left-sided back and leg pain, with left foot weakness, and subjective complaints of urinary hesitancy and retention.  Her MRI demonstrated very large left eccentric likely inferiorly migrated disc fragment from the L4-5 interspace with resultant severe lateral recess stenosis and central stenosis between L4-5 and L5-S1.  With her clinical findings, surgical decompression was indicated.  The risks and benefits of the surgery were explained in detail to the patient and her family.  After all questions were answered informed consent was obtained and witnessed.  PROCEDURE IN DETAIL: The patient was brought to the operating room. After induction of general anesthesia, the patient was positioned on the operative table in the prone position with all pressure points meticulously padded. The skin of the low back was then prepped and draped in the usual sterile fashion.  Under fluoroscopy, the correct level was identified and marked out on the skin, and after timeout was conducted, the skin was infiltrated with local anesthetic. Skin incision was then made sharply and Bovie electrocautery was used to dissect the subcutaneous tissue until the lumbodorsal fascia was identified. The fascia was then incised using Bovie electrocautery and the lamina at the L4 and L5 levels was  identified and dissection was carried out in the subperiosteal plane. Self-retaining retractor was then placed, and intraoperative x-ray was taken which identified the dissector to be at the top of the L5 lamina, essentially at the L4-5 interspace.  Therefore moved the self-retaining retractors slightly inferiorly to expose the entirety of the left L5 hemilamina.  Using a high-speed drill, complete left L5 laminotomy was completed with a partial medial facetectomy. The ligamentum flavum was then identified and removed and the lateral edge of the thecal sac was identified.  The thecal sac was noted to be significantly dorsally displaced, and medially displaced.  Immediately underneath, there were multiple very large free disc fragments behind the L5 vertebral body.  Using a combination of ball-tipped dissectors, multiple large fragments were removed.  The traversing left S1 nerve root was then seen moving back into a more lateral position.  I was then able to pass a long right angle ball-tipped dissector superiorly to where the L4-5 disc space.  I removed multiple additional free disc fragments.  I was then able to palpate a slightly calcified relatively central L4-5 disc herniation which was seen on the preoperative MRI scan.  I was freely able to pass the ball-tipped dissector underneath the thecal sac behind the 5 vertebral body, underneath the left S1 nerve root, and out the foramen.  I was therefore satisfied with the decompression.  I elected not to incise the central L4-5 and L5-S1 discs, as these were partially calcified on the preoperative scans, and likely chronic in nature.  Hemostasis was then secured using a combination of morcellized Gelfoam and thrombin and bipolar electrocautery. The wound is irrigated with copious amounts of antibiotic saline irrigation. The nerve root was then covered with  a long-acting steroid solution. Self-retaining retractor was then removed, and the wound is closed in  layers using a combination of interrupted 0 Vicryl and 3-0 Vicryl stitches. The skin was closed using standard skin glue.  At the end of the case all sponge, needle, and instrument counts were correct. The patient was then transferred to the stretcher and taken to the postanesthesia care unit in stable hemodynamic condition.

## 2017-10-22 NOTE — Transfer of Care (Signed)
Immediate Anesthesia Transfer of Care Note  Patient: Amber Moss  Procedure(s) Performed: LUMBAR - FIVE LAMINOTOMY AND MICRODISCECTOMY (Left Back)  Patient Location: PACU  Anesthesia Type:General  Level of Consciousness: awake, alert  and oriented  Airway & Oxygen Therapy: Patient Spontanous Breathing and Patient connected to face mask oxygen  Post-op Assessment: Report given to RN and Post -op Vital signs reviewed and stable  Post vital signs: Reviewed and stable  Last Vitals:  Vitals Value Taken Time  BP 133/80 10/22/2017  8:28 PM  Temp    Pulse 103 10/22/2017  8:28 PM  Resp 11 10/22/2017  8:28 PM  SpO2 99 % 10/22/2017  8:28 PM  Vitals shown include unvalidated device data.  Last Pain:  Vitals:   10/22/17 1621  TempSrc: Oral  PainSc: 0-No pain      Patients Stated Pain Goal: 7 (24/49/75 3005)  Complications: No apparent anesthesia complications

## 2017-10-22 NOTE — Anesthesia Preprocedure Evaluation (Addendum)
Anesthesia Evaluation  Patient identified by MRN, date of birth, ID band Patient awake    Reviewed: Allergy & Precautions, NPO status , Patient's Chart, lab work & pertinent test results  History of Anesthesia Complications (+) PONV  Airway Mallampati: III  TM Distance: >3 FB Neck ROM: Full    Dental  (+) Teeth Intact, Dental Advisory Given   Pulmonary neg pulmonary ROS,    breath sounds clear to auscultation       Cardiovascular negative cardio ROS   Rhythm:Regular Rate:Normal     Neuro/Psych Depression negative neurological ROS     GI/Hepatic negative GI ROS, Neg liver ROS,   Endo/Other  diabetes, Type 2, Oral Hypoglycemic AgentsHypothyroidism   Renal/GU negative Renal ROS  negative genitourinary   Musculoskeletal negative musculoskeletal ROS (+)   Abdominal (+) + obese,   Peds  Hematology negative hematology ROS (+)   Anesthesia Other Findings   Reproductive/Obstetrics                            Anesthesia Physical Anesthesia Plan  ASA: II  Anesthesia Plan: General   Post-op Pain Management:    Induction: Intravenous  PONV Risk Score and Plan: 4 or greater and Ondansetron, Dexamethasone, Midazolam and Scopolamine patch - Pre-op  Airway Management Planned: Oral ETT  Additional Equipment: None  Intra-op Plan:   Post-operative Plan: Extubation in OR  Informed Consent: I have reviewed the patients History and Physical, chart, labs and discussed the procedure including the risks, benefits and alternatives for the proposed anesthesia with the patient or authorized representative who has indicated his/her understanding and acceptance.   Dental advisory given  Plan Discussed with: CRNA  Anesthesia Plan Comments:        Anesthesia Quick Evaluation

## 2017-10-23 ENCOUNTER — Other Ambulatory Visit: Payer: Self-pay

## 2017-10-23 ENCOUNTER — Encounter (HOSPITAL_COMMUNITY): Payer: Self-pay | Admitting: Neurosurgery

## 2017-10-23 DIAGNOSIS — Z79891 Long term (current) use of opiate analgesic: Secondary | ICD-10-CM | POA: Diagnosis not present

## 2017-10-23 DIAGNOSIS — M5106 Intervertebral disc disorders with myelopathy, lumbar region: Secondary | ICD-10-CM | POA: Diagnosis not present

## 2017-10-23 DIAGNOSIS — Z7984 Long term (current) use of oral hypoglycemic drugs: Secondary | ICD-10-CM | POA: Diagnosis not present

## 2017-10-23 DIAGNOSIS — M48061 Spinal stenosis, lumbar region without neurogenic claudication: Secondary | ICD-10-CM | POA: Diagnosis not present

## 2017-10-23 DIAGNOSIS — E119 Type 2 diabetes mellitus without complications: Secondary | ICD-10-CM | POA: Diagnosis not present

## 2017-10-23 DIAGNOSIS — E039 Hypothyroidism, unspecified: Secondary | ICD-10-CM | POA: Diagnosis not present

## 2017-10-23 DIAGNOSIS — G834 Cauda equina syndrome: Secondary | ICD-10-CM | POA: Diagnosis not present

## 2017-10-23 DIAGNOSIS — Z79890 Hormone replacement therapy (postmenopausal): Secondary | ICD-10-CM | POA: Diagnosis not present

## 2017-10-23 DIAGNOSIS — F329 Major depressive disorder, single episode, unspecified: Secondary | ICD-10-CM | POA: Diagnosis not present

## 2017-10-23 DIAGNOSIS — Z79899 Other long term (current) drug therapy: Secondary | ICD-10-CM | POA: Diagnosis not present

## 2017-10-23 DIAGNOSIS — Z7952 Long term (current) use of systemic steroids: Secondary | ICD-10-CM | POA: Diagnosis not present

## 2017-10-23 LAB — BASIC METABOLIC PANEL
Anion gap: 9 (ref 5–15)
BUN: 12 mg/dL (ref 6–20)
CO2: 24 mmol/L (ref 22–32)
Calcium: 8.9 mg/dL (ref 8.9–10.3)
Chloride: 106 mmol/L (ref 98–111)
Creatinine, Ser: 0.67 mg/dL (ref 0.44–1.00)
GFR calc Af Amer: >60 mL/min (ref >60)
GFR calc non Af Amer: >60 mL/min (ref >60)
Glucose, Bld: 160 mg/dL — ABNORMAL HIGH (ref 70–99)
Potassium: 4.5 mmol/L (ref 3.5–5.1)
Sodium: 139 mmol/L (ref 135–145)

## 2017-10-23 LAB — CBC
HCT: 43.3 % (ref 36.0–46.0)
Hemoglobin: 14.4 g/dL (ref 12.0–15.0)
MCH: 30.1 pg (ref 26.0–34.0)
MCHC: 33.3 g/dL (ref 30.0–36.0)
MCV: 90.4 fL (ref 78.0–100.0)
Platelets: 285 10*3/uL (ref 150–400)
RBC: 4.79 MIL/uL (ref 3.87–5.11)
RDW: 12.3 % (ref 11.5–15.5)
WBC: 15.4 10*3/uL — ABNORMAL HIGH (ref 4.0–10.5)

## 2017-10-23 LAB — APTT: aPTT: 23 seconds — ABNORMAL LOW (ref 24–36)

## 2017-10-23 LAB — PROTIME-INR
INR: 0.95
Prothrombin Time: 12.6 seconds (ref 11.4–15.2)

## 2017-10-23 MED ORDER — METHOCARBAMOL 500 MG PO TABS: 500.0000 mg | ORAL_TABLET | Freq: Two times a day (BID) | ORAL | 2 refills | Status: DC

## 2017-10-23 MED ORDER — OXYCODONE-ACETAMINOPHEN 5-325 MG PO TABS: 1.0000 | ORAL_TABLET | ORAL | 0 refills | Status: DC | PRN

## 2017-10-23 NOTE — Progress Notes (Signed)
Physical Therapy Evaluation Patient Details Name: Amber Moss MRN: [id number] DOB: [birth year] Today's Date: 10/23/2017   History of Present Illness  Pt is a 42 y/o female s/p left L5 laminotomy and microdisectomy for decompression of thecal sac and nerve roots after onset of L sided back/leg pain, weakness and urinary hesitancy/retention.  No significant PMH.   Clinical Impression  Patient is s/p above surgery resulting in functional limitations due to the deficits listed below (see PT Problem List). Pt and significant other educated multiple times throughout session on importance of back precautions as pt demonstrated decreased compliance. Pt performed ambulation with HHA+1 initially and transitioned to [facility] for stability secondary to decreased sensation in  LLE. Recommending SPC at d/c for improved safety as pt reports concerns with falling at d/c, with pt and significant other in agreement. Pt also given gait belt at d/c to improve safety with stair negotiation, educating pt and significant other on proper hand placement and guarding technique. Patient will benefit from skilled PT to increase their independence and safety with mobility to allow discharge to the venue listed below.       Follow Up Recommendations No PT follow up;Supervision for mobility/OOB    Equipment Recommendations  Cane    Recommendations for Other Services       Precautions / Restrictions Precautions Precautions: Back Precaution Booklet Issued: Yes (comment) Precaution Comments: Reviewed precautions with pt and reitterated importance of adhearing to them  Restrictions Weight Bearing Restrictions: No      Mobility  Bed Mobility               General bed mobility comments: Pt coming into sitting upon entering. Pt performed bed mobility without log roll, verbally reviewed log roll sequence and importance of perfroming such to avoid breaking precautions.   Transfers Overall transfer level: Modified  independent Equipment used: Straight cane Transfers: Sit to/from Stand Sit to Stand: Modified independent (Device/Increase time)         General transfer comment: Min cues for safety and technique with descent to hard surface with use of a brace   Ambulation/Gait Ambulation/Gait assistance: Min guard;Min assist Gait Distance (Feet): 350 Feet Assistive device: 1 person hand held assist;Straight cane Gait Pattern/deviations: Step-through pattern;Decreased step length - left;Decreased stance time - left;Decreased weight shift to left;Antalgic;Wide base of support Gait velocity: decreased Gait velocity interpretation: <1.31 ft/sec, indicative of household ambulator General Gait Details: Pt initially ambulated using rail on wall for stability secondary to  LLE numbness. Pt improved gait pattern with HHA +1 and SPC. Min cues for safety, pacing, and maintening precautions when speaking with family member to avoid twisting.   Stairs Stairs: Yes Stairs assistance: Min assist Stair Management: No rails;Step to pattern;Forwards;Backwards Number of Stairs: 3 General stair comments: Min cues for safety and technique with HHA+1 and gait belt. Pt ascended forwards with R LE and descended backwards with L LE, secondary to L LE  numbness. Verbally instructed pt's significant other on safe guarding and assit when descending stairs forwards.  Wheelchair Mobility    Modified Rankin (Stroke Patients Only)       Balance Overall balance assessment: Needs assistance Sitting-balance support: No upper extremity supported;Feet supported Sitting balance-Leahy Scale: Good     Standing balance support: Single extremity supported Standing balance-Leahy Scale: Fair Standing balance comment: Pt performed static standing activites without UE support. At least one UE support needed for stability with dynamic standing activities.  Pertinent Vitals/Pain Pain Assessment:  0-10 Pain Score: 2  Pain Location: Incision site on back Pain Descriptors / Indicators: Discomfort;Operative site guarding;Dull Pain Intervention(s): Limited activity within patient's tolerance;Monitored during session    [place] expects to be discharged to:: Private residence Living Arrangements: Spouse/significant other Available Help at Discharge: Family Type of Home: House Home Access: Stairs to enter Entrance Stairs-Rails: None Entrance Stairs-Number of Steps: 3 Home Layout: Two level;Laundry or work area in [employer]: None      Prior Function Level of Independence: Independent         Comments: independent and working; 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 LLE Sensation: decreased light touch    Cervical / Trunk Assessment Cervical / Trunk Assessment: Other exceptions Cervical / Trunk Exceptions: s/p lumbar sx  Communication   Communication: No difficulties  Cognition Arousal/Alertness: Awake/alert Behavior During Therapy: WFL for tasks assessed/performed Overall Cognitive Status: Within Functional Limits for tasks assessed                                        General Comments General comments (skin integrity, edema, etc.): Signigicant other present     Exercises     Assessment/Plan    PT Assessment Patient needs continued PT services  PT Problem List Decreased strength;Decreased range of motion;Decreased activity tolerance;Decreased balance;Decreased mobility;Decreased coordination;Decreased knowledge of use of DME;Decreased safety awareness;Decreased knowledge of precautions;Pain;Impaired sensation       PT Treatment  Interventions DME instruction;Gait training;Stair training;Functional mobility training;Therapeutic activities;Therapeutic exercise;Balance training;Patient/family education    PT Goals (Current goals can be found in the Care Plan section)  Acute Rehab PT Goals Patient Stated Goal: home today PT Goal Formulation: With patient Time For Goal Achievement: 10/30/17 Potential to Achieve Goals: Good    Frequency Min 5X/week   Barriers to discharge        Co-evaluation               AM-PAC PT "6 Clicks" Daily Activity  Outcome Measure Difficulty turning over in bed (including adjusting bedclothes, sheets and blankets)?: A Little Difficulty moving from lying on back to sitting on the side of the bed? : A Little Difficulty sitting down on and standing up from a chair with arms (e.g., wheelchair, bedside commode, etc,.)?: A Little Help needed moving to and from a bed to chair (including a wheelchair)?: A Little Help needed walking in hospital room?: A Little Help needed climbing 3-5 steps with a railing? : A Lot 6 Click Score: 17    End of Session Equipment Utilized During Treatment: Gait belt Activity Tolerance: Patient tolerated treatment well Patient left: in chair;with call bell/phone within reach Nurse Communication: Mobility status PT Visit Diagnosis: Unsteadiness on feet (R26.81);Other abnormalities of gait and mobility (R26.89);Muscle weakness (generalized) (M62.81);Pain Pain - part of body: (back at incision site)    Time: 1572-6203 PT Time Calculation (min) (ACUTE ONLY): 24 min   Charges:   PT Evaluation $PT Eval Moderate Complexity: 1 Mod PT Treatments $Gait Training: 8-22 mins        Einar Crow, SPT  Student Physical Therapist Acute Rehab [phone]  Einar Crow 10/23/2017, 1:06 PM

## 2017-10-23 NOTE — Anesthesia Postprocedure Evaluation (Signed)
Anesthesia Post Note  Patient: Amber Moss  Procedure(s) Performed: LUMBAR - FIVE LAMINOTOMY AND MICRODISCECTOMY (Left Back)     Patient location during evaluation: PACU Anesthesia Type: General Level of consciousness: awake and alert Pain management: pain level controlled Vital Signs Assessment: post-procedure vital signs reviewed and stable Respiratory status: spontaneous breathing, nonlabored ventilation, respiratory function stable and patient connected to nasal cannula oxygen Cardiovascular status: blood pressure returned to baseline and stable Postop Assessment: no apparent nausea or vomiting Anesthetic complications: no    Last Vitals:  Vitals:   10/22/17 2308 10/23/17 0350  BP: 132/72 127/66  Pulse: 81 72  Resp: 18 18  Temp: 36.6 C 36.7 C  SpO2: 99% 97%    Last Pain:  Vitals:   10/23/17 0350  TempSrc: Oral  PainSc:                  Amber Moss

## 2017-10-23 NOTE — Progress Notes (Signed)
Patient alert and oriented, mae's well, voiding adequate amount of urine, swallowing without difficulty,  c/o mild pain at time of discharge and medication given prior to discharged. Patient discharged home with family. Script and discharged instructions given to patient. Patient and family stated understanding of instructions given. Patient has an appointment with Dr. Kathyrn Sheriff

## 2017-10-23 NOTE — Discharge Summary (Signed)
Physician Discharge Summary  Patient ID: Amber Moss MRN: [id number] DOB/AGE: [birth year] 42 y.o.  Admit date: 10/22/2017 Discharge date: 10/23/2017  Admission Diagnoses:  Lumbar HNP  Discharge Diagnoses:  Same Active Problems:   HNP (herniated nucleus pulposus), lumbar   Lumbar herniated disc   Discharged Condition: Stable  Hospital Course:  Amber Moss is a 42 y.o. female who was admitted for the below procedure. There were no post operative complications. At time of discharge, pain was well controlled, ambulating with Pt/OT, tolerating po, voiding normal. Ready for discharge.  Treatments: Surgery - Left L5 laminotomy and microdiscectomy for decompression of thecal sac and nerve roots  Discharge Exam: Blood pressure 138/79, pulse 70, temperature 98.6 F (37 C), temperature source Oral, resp. rate 16, height 5\' 4"  (1.626 m), weight 115.2 kg, SpO2 99 %. Awake, alert, oriented Speech fluent, appropriate CN grossly intact MAEW with good strength Wound c/d/i  Disposition: Discharge disposition: 01-Home or Self Care       Discharge Instructions    Call MD for:  difficulty breathing, headache or visual disturbances   Complete by:  As directed    Call MD for:  persistant dizziness or light-headedness   Complete by:  As directed    Call MD for:  redness, tenderness, or signs of infection (pain, swelling, redness, odor or green/yellow discharge around incision site)   Complete by:  As directed    Call MD for:  severe uncontrolled pain   Complete by:  As directed    Call MD for:  temperature >100.4   Complete by:  As directed    Diet general   Complete by:  As directed    Driving Restrictions   Complete by:  As directed    Do not drive until given clearance.   Increase activity slowly   Complete by:  As directed    Lifting restrictions   Complete by:  As directed    Do not lift anything >10lbs. Avoid bending and twisting in awkward positions. Avoid bending at  the back.   May shower / Bathe   Complete by:  As directed    In 24 hours. Okay to wash wound with warm soapy water. Avoid scrubbing the wound. Pat dry.   Remove dressing in 24 hours   Complete by:  As directed      Allergies as of 10/23/2017      Reactions   Keflex [cephalexin] Hives      Medication List    STOP taking these medications   ibuprofen 800 MG tablet Commonly known as:  ADVIL,MOTRIN     TAKE these medications   acetaminophen 325 MG tablet Commonly known as:  TYLENOL Take 650 mg by mouth every 6 (six) hours as needed.   AIMOVIG 70 MG/ML Soaj Generic drug:  Erenumab-aooe Inject 70 mg into the skin every 30 (thirty) days.   cholecalciferol 1000 units tablet Commonly known as:  VITAMIN D Take 1,000 Units by mouth daily.   citalopram 40 MG tablet Commonly known as:  CELEXA Take 40 mg by mouth daily.   levothyroxine 200 MCG tablet Commonly known as:  SYNTHROID, LEVOTHROID Take 250 mcg by mouth daily before breakfast.   metFORMIN 500 MG tablet Commonly known as:  GLUCOPHAGE Take 1,000 mg by mouth 2 (two) times daily.   methocarbamol 500 MG tablet Commonly known as:  ROBAXIN Take 1 tablet (500 mg total) by mouth 2 (two) times daily.   oxyCODONE-acetaminophen 5-325 MG tablet Commonly known as:  PERCOCET/ROXICET  Take 1 tablet by mouth every 4 (four) hours as needed for severe pain.   predniSONE 10 MG (21) Tbpk tablet Commonly known as:  STERAPRED UNI-PAK 21 TAB Take 20 mg by mouth daily.   traMADol 50 MG tablet Commonly known as:  ULTRAM Take 50 mg by mouth 2 (two) times daily.      Follow-up Information    Consuella Lose, MD. Schedule an appointment as soon as possible for a visit in 2 week(s).   Specialty:  Neurosurgery Contact information: [address] [place] [address] [place] [address] [phone]           Signed: Traci Sermon 10/23/2017, 7:57 AM

## 2017-10-23 NOTE — Evaluation (Signed)
Occupational Therapy Evaluation Patient Details Name: Amber Moss MRN: [id number] DOB: [birth year] Today's Date: 10/23/2017    History of Present Illness Pt is a 42 y/o female s/p left L5 laminotomy and microdisectomy for decompression of thecal sac and nerve roots after onset of L sided back/leg pain, weakness and urinary hesitancy/retention.  No significant PMH.    Clinical Impression   PTA patient was independent and working (until injury and required increased assistance).  Patient currently requires minA for LB bathing standing, min guard for tub transfers, and supervision for toileting, LB dressing.  She was educated on back precautions, safety, mobility, energy conservations, and compensatory ADL techniques.  She will have support at home from her significant other. Patient able to recall 3/3 back precautions, but requires intermittent cueing to adhere to during functional mobility and ADLs.  Based on performance today, no further OT needs are required.  Thank you for this referral! OT signing off.     Follow Up Recommendations  No OT follow up;Supervision - Intermittent    Equipment Recommendations  None recommended by OT    Recommendations for Other Services PT consult     Precautions / Restrictions Precautions Precautions: Back Precaution Booklet Issued: Yes (comment) Precaution Comments: reviewed precautions with patinet  Restrictions Weight Bearing Restrictions: No      Mobility Bed Mobility Overal bed mobility: Needs Assistance Bed Mobility: (long sitting to sitting EOB )           General bed mobility comments: completed long sitting to EOB with supervision, reviewed log rolling technique when supine in bed   Transfers Overall transfer level: Modified independent Equipment used: None             General transfer comment: able to demonstrate good technique and safety with adherance to back precautions     Balance Overall balance assessment: No  apparent balance deficits (not formally assessed)                                         ADL either performed or assessed with clinical judgement   ADL Overall ADL's : Needs assistance/impaired     Grooming: Modified independent;Standing Grooming Details (indicate cue type and reason): educated on compensatory techniques for grooming at sink, patinet verbalizing "I've been squatting" Upper Body Bathing: Supervision/ safety;Standing   Lower Body Bathing: Sit to/from stand;Minimal assistance;Cueing for compensatory techniques;Cueing for back precautions Lower Body Bathing Details (indicate cue type and reason): educated on safety; patient refusing shower chair at this time; reports will have assistance from family to wash B feet and supervision while in shower standing  Upper Body Dressing : Set up;Sitting   Lower Body Dressing: Set up;Sit to/from stand;Cueing for safety;Cueing for back precautions Lower Body Dressing Details (indicate cue type and reason): educated on compensatory techniques for LB dressing to adhere at back precautions, able to complete figure 4 technqiue without assistance  Toilet Transfer: Modified Independent;Regular Toilet;Ambulation Toilet Transfer Details (indicate cue type and reason): no assistance, good technique for back precaution adherance Toileting- Clothing Manipulation and Hygiene: Supervision/safety;Sit to/from stand;Cueing for back precautions Toileting - Clothing Manipulation Details (indicate cue type and reason): reviewed technique for compensation and back precautions Tub/ Shower Transfer: Min guard;Ambulation;Tub transfer Tub/Shower Transfer Details (indicate cue type and reason): educated on technique for stepping over tub, patient requires min guard for safety and signifincant other will assist Functional mobility during ADLs: Modified  independent General ADL Comments: Patient limited by back precautions, cueing to adhere to during  LB dressing and tub transfers.      Vision   Vision Assessment?: No apparent visual deficits     Perception     Praxis      Pertinent Vitals/Pain Pain Assessment: Faces Faces Pain Scale: Hurts little more Pain Location: back Pain Descriptors / Indicators: Discomfort;Operative site guarding Pain Intervention(s): Limited activity within patient's tolerance;Repositioned     Hand Dominance     Extremity/Trunk Assessment Upper Extremity Assessment Upper Extremity Assessment: Overall WFL for tasks assessed   Lower Extremity Assessment Lower Extremity Assessment: Defer to PT evaluation(reports numbness in L LE )   Cervical / Trunk Assessment Cervical / Trunk Assessment: Other exceptions Cervical / Trunk Exceptions: s/p lumbar sx   Communication Communication Communication: No difficulties   Cognition Arousal/Alertness: Awake/alert Behavior During Therapy: WFL for tasks assessed/performed Overall Cognitive Status: Within Functional Limits for tasks assessed                                     General Comments  significant other present    Exercises     Shoulder Instructions      Home Living Family/patient expects to be discharged to:: Private residence Living Arrangements: Spouse/significant other Available Help at Discharge: Family Type of Home: House Home Access: Stairs to enter Technical brewer of Steps: 3 Entrance Stairs-Rails: None Home Layout: Two level;Laundry or work area in basement     [employer] Shower/Tub: Occupational psychologist: None          Prior Functioning/Environment Level of Independence: Independent        Comments: independent and working; after injury required assistance         OT Problem List: Decreased activity tolerance;Decreased safety awareness;Decreased knowledge of use of DME or AE;Decreased knowledge of precautions;Pain      OT Treatment/Interventions:       OT Goals(Current goals can be found in the care plan section) Acute Rehab OT Goals Patient Stated Goal: home today OT Goal Formulation: With patient  OT Frequency:     Barriers to D/C:            Co-evaluation              AM-PAC PT "6 Clicks" Daily Activity     Outcome Measure Help from another person eating meals?: None Help from another person taking care of personal grooming?: None Help from another person toileting, which includes using toliet, bedpan, or urinal?: A Little Help from another person bathing (including washing, rinsing, drying)?: A Little Help from another person to put on and taking off regular upper body clothing?: None Help from another person to put on and taking off regular lower body clothing?: None 6 Click Score: 22   End of Session Nurse Communication: Mobility status  Activity Tolerance: Patient tolerated treatment well Patient left: with call bell/phone within reach;with family/visitor present(seated EOB)  OT Visit Diagnosis: Pain Pain - Right/Left: Left Pain - part of body: Leg(back)                Time: 3235-5732 OT Time Calculation (min): 13 min Charges:  OT General Charges $OT Visit: 1 Visit OT Evaluation $OT Eval Low Complexity: 1 Low  Delight Stare, OTR/L  Pager [place] 10/23/2017, 8:33 AM

## 2017-10-23 NOTE — Progress Notes (Signed)
Neurosurgery Progress Note  No issues overnight.  Minimal pain Persistent left foot numbness, but improvement in strength Urinary issues resolved  EXAM:  BP 127/66 (BP Location: Left Arm)   Pulse 72   Temp 98.1 F (36.7 C) (Oral)   Resp 18   Ht 5\' 4"  (1.626 m)   Wt 115.2 kg   SpO2 97%   BMI 43.60 kg/m   Awake, alert, oriented  Speech fluent, appropriate  CN grossly intact  MAEW with good strength, particularly left TA, EHL and gastroc improved  PLAN Doing well this am Significant improvement in LLE strength Urinary issues resolved. Has been urinating well and feels as though emptying bladder Cleared for d/c. Orders signed.

## 2017-11-27 DIAGNOSIS — M4306 Spondylolysis, lumbar region: Secondary | ICD-10-CM | POA: Diagnosis not present

## 2017-11-27 DIAGNOSIS — M48061 Spinal stenosis, lumbar region without neurogenic claudication: Secondary | ICD-10-CM | POA: Diagnosis not present

## 2017-11-27 DIAGNOSIS — M5126 Other intervertebral disc displacement, lumbar region: Secondary | ICD-10-CM | POA: Diagnosis not present

## 2017-11-27 DIAGNOSIS — M5127 Other intervertebral disc displacement, lumbosacral region: Secondary | ICD-10-CM | POA: Diagnosis not present

## 2017-12-03 ENCOUNTER — Other Ambulatory Visit: Payer: Self-pay | Admitting: Neurosurgery

## 2017-12-08 ENCOUNTER — Other Ambulatory Visit: Payer: Self-pay | Admitting: Neurosurgery

## 2017-12-10 NOTE — Pre-Procedure Instructions (Signed)
Amber Moss  12/10/2017      [facility] - [place], [place], [place] [address] [place] [address] Phone: [phone] Fax: [phone]    Your procedure is scheduled on October 11th.  Report to [facility] Admitting at 1030 A.M.  Call this number if you have problems the morning of surgery:  [phone]   Remember:  Do not eat or drink after midnight.    Take these medicines the morning of surgery with A SIP OF WATER   Celexa  Synthroid  Robaxin (if needed)  Percocet (if needed)  Tramadol (if needed)  7 days prior to surgery STOP taking any Aspirin(unless otherwise instructed by your surgeon), Aleve, Naproxen, Ibuprofen, Motrin, Advil, Goody's, BC's, all herbal medications, fish oil, and all vitamins    WHAT DO I DO ABOUT MY DIABETES MEDICATION?   Marland Kitchen Do not take oral diabetes medicines (pills) the morning of surgery: Metformin.   How to Manage Your Diabetes Before and After Surgery  Why is it important to control my blood sugar before and after surgery? . Improving blood sugar levels before and after surgery helps healing and can limit problems. . A way of improving blood sugar control is eating a healthy diet by: o  Eating less sugar and carbohydrates o  Increasing activity/exercise o  Talking with your doctor about reaching your blood sugar goals . High blood sugars (greater than 180 mg/dL) can raise your risk of infections and slow your recovery, so you will need to focus on controlling your diabetes during the weeks before surgery. . Make sure that the doctor who takes care of your diabetes knows about your planned surgery including the date and location.  How do I manage my blood sugar before surgery? . Check your blood sugar at least 4 times a day, starting 2 days before surgery, to make sure that the level is not too high or low. o Check your blood sugar the morning of your surgery when you wake up and every 2  hours until you get to the Short Stay unit. . If your blood sugar is less than 70 mg/dL, you will need to treat for low blood sugar: o Do not take insulin. o Treat a low blood sugar (less than 70 mg/dL) with  cup of clear juice (cranberry or apple), 4 glucose tablets, OR glucose gel. o Recheck blood sugar in 15 minutes after treatment (to make sure it is greater than 70 mg/dL). If your blood sugar is not greater than 70 mg/dL on recheck, call [phone] for further instructions. . Report your blood sugar to the short stay nurse when you get to Short Stay.  . If you are admitted to the hospital after surgery: o Your blood sugar will be checked by the staff and you will probably be given insulin after surgery (instead of oral diabetes medicines) to make sure you have good blood sugar levels. o The goal for blood sugar control after surgery is 80-180 mg/dL.     Do not wear jewelry, make-up or nail polish.  Do not wear lotions, powders, or perfumes, or deodorant.  Do not shave 48 hours prior to surgery.  Men may shave face and neck.  Do not bring valuables to the hospital.  [facility] is not responsible for any belongings or valuables.  Contacts, dentures or bridgework may not be worn into surgery.  Leave your suitcase in the car.  After  surgery it may be brought to your room.  For patients admitted to the hospital, discharge time will be determined by your treatment team.  Patients discharged the day of surgery will not be allowed to drive home.    [facility]- Preparing For Surgery  Before surgery, you can play an important role. Because skin is not sterile, your skin needs to be as free of germs as possible. You can reduce the number of germs on your skin by washing with CHG (chlorahexidine gluconate) Soap before surgery.  CHG is an antiseptic cleaner which kills germs and bonds with the skin to continue killing germs even after washing.    Oral Hygiene is also important to reduce  your risk of infection.  Remember - BRUSH YOUR TEETH THE MORNING OF SURGERY WITH YOUR REGULAR TOOTHPASTE  Please do not use if you have an allergy to CHG or antibacterial soaps. If your skin becomes reddened/irritated stop using the CHG.  Do not shave (including legs and underarms) for at least 48 hours prior to first CHG shower. It is OK to shave your face.  Please follow these instructions carefully.   1. Shower the NIGHT BEFORE SURGERY and the MORNING OF SURGERY with CHG.   2. If you chose to wash your hair, wash your hair first as usual with your normal shampoo.  3. After you shampoo, rinse your hair and body thoroughly to remove the shampoo.  4. Use CHG as you would any other liquid soap. You can apply CHG directly to the skin and wash gently with a scrungie or a clean washcloth.   5. Apply the CHG Soap to your body ONLY FROM THE NECK DOWN.  Do not use on open wounds or open sores. Avoid contact with your eyes, ears, mouth and genitals (private parts). Wash Face and genitals (private parts)  with your normal soap.  6. Wash thoroughly, paying special attention to the area where your surgery will be performed.  7. Thoroughly rinse your body with warm water from the neck down.  8. DO NOT shower/wash with your normal soap after using and rinsing off the CHG Soap.  9. Pat yourself dry with a CLEAN TOWEL.  10. Wear CLEAN PAJAMAS to bed the night before surgery, wear comfortable clothes the morning of surgery  11. Place CLEAN SHEETS on your bed the night of your first shower and DO NOT SLEEP WITH PETS.    Day of Surgery:  Do not apply any deodorants/lotions.  Please wear clean clothes to the hospital/surgery center.   Remember to brush your teeth WITH YOUR REGULAR TOOTHPASTE.    Please read over the following fact sheets that you were given.

## 2017-12-11 ENCOUNTER — Encounter (HOSPITAL_COMMUNITY)
Admission: RE | Admit: 2017-12-11 | Discharge: 2017-12-11 | Disposition: A | Discharge status: Home / Self Care | Payer: BLUE CROSS/BLUE SHIELD | Source: Ambulatory Visit | Attending: Neurosurgery | Admitting: Neurosurgery

## 2017-12-11 ENCOUNTER — Encounter (HOSPITAL_COMMUNITY): Payer: Self-pay

## 2017-12-11 ENCOUNTER — Other Ambulatory Visit: Payer: Self-pay

## 2017-12-11 DIAGNOSIS — Z23 Encounter for immunization: Secondary | ICD-10-CM | POA: Diagnosis not present

## 2017-12-11 DIAGNOSIS — Z01812 Encounter for preprocedural laboratory examination: Secondary | ICD-10-CM | POA: Diagnosis not present

## 2017-12-11 LAB — TYPE AND SCREEN
ABO/RH(D): A NEG
Antibody Screen: NEGATIVE

## 2017-12-11 LAB — BASIC METABOLIC PANEL
Anion gap: 10 (ref 5–15)
BUN: 8 mg/dL (ref 6–20)
CO2: 21 mmol/L — ABNORMAL LOW (ref 22–32)
Calcium: 9.2 mg/dL (ref 8.9–10.3)
Chloride: 108 mmol/L (ref 98–111)
Creatinine, Ser: 0.63 mg/dL (ref 0.44–1.00)
GFR calc Af Amer: >60 mL/min (ref >60)
GFR calc non Af Amer: >60 mL/min (ref >60)
Glucose, Bld: 96 mg/dL (ref 70–99)
Potassium: 4 mmol/L (ref 3.5–5.1)
Sodium: 139 mmol/L (ref 135–145)

## 2017-12-11 LAB — CBC
HCT: 42.9 % (ref 36.0–46.0)
Hemoglobin: 14.1 g/dL (ref 12.0–15.0)
MCH: 30.5 pg (ref 26.0–34.0)
MCHC: 32.9 g/dL (ref 30.0–36.0)
MCV: 92.9 fL (ref 78.0–100.0)
Platelets: 228 10*3/uL (ref 150–400)
RBC: 4.62 MIL/uL (ref 3.87–5.11)
RDW: 13.4 % (ref 11.5–15.5)
WBC: 5.8 10*3/uL (ref 4.0–10.5)

## 2017-12-11 LAB — SURGICAL PCR SCREEN
MRSA, PCR: NEGATIVE
Staphylococcus aureus: NEGATIVE

## 2017-12-11 LAB — ABO/RH: ABO/RH(D): A NEG

## 2017-12-11 NOTE — Progress Notes (Signed)
PCP - Clemmie Krill Cardiologist - denies  Chest x-ray - N/A EKG - N/A Stress Test -denies  ECHO - denies Cardiac Cath - denies  Sleep Study - denies  Pt states she takes Metformin for PCOS and not for diabetes. Pt instructed not to take Metformin DOS and also received diabetes instructions. Pt does not check CBG at home.   Blood Thinner Instructions: N/A Aspirin Instructions:N/A  Anesthesia review: No  Patient denies shortness of breath, fever, cough and chest pain at PAT appointment   Patient verbalized understanding of instructions that were given to them at the PAT appointment. Patient was also instructed that they will need to review over the PAT instructions again at home before surgery.

## 2017-12-17 MED ORDER — VANCOMYCIN HCL 10 G IV SOLR
1500.0000 mg | INTRAVENOUS | Status: AC
  Administered 2017-12-18: 1500 mg via INTRAVENOUS
  Filled 2017-12-17: qty 1500

## 2017-12-17 NOTE — H&P (Signed)
Chief Complaint   Back pain  HPI    HPI: Amber Moss is a 42 y.o. female with recurrent left lower extremity pain approximately 3 weeks after undergoing left sided L4-5 laminotomy and microdiskectomy due to lumbar HNP on 8/15. 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  While she does report some subjective left foot weakness, she is able to move her foot fine.  She has not had any falls.  She has not noted any changes in bowel or bladder function. An MRI of her lumbar spine shows recurrent left L4-5 disc herniation. She presents today for surgery. She is without any concerns.  Patient Active Problem List   Diagnosis Date Noted  . HNP (herniated nucleus pulposus), lumbar 10/22/2017  . Lumbar herniated disc 10/22/2017    PMH: Past Medical History:  Diagnosis Date  . Depression   . Fatty liver   . PONV (postoperative nausea and vomiting)     PSH: Past Surgical History:  Procedure Laterality Date  . ABLATION    . CARPAL TUNNEL RELEASE Bilateral   . CHOLECYSTECTOMY    . DILATION AND CURETTAGE OF UTERUS    . EYE SURGERY    . LUMBAR LAMINECTOMY/DECOMPRESSION MICRODISCECTOMY Left 10/22/2017   Procedure: LUMBAR - FIVE LAMINOTOMY AND MICRODISCECTOMY;  Surgeon: Consuella Lose, MD;  Location: [facility];  Service: Neurosurgery;  Laterality: Left;   LUMBAR - FIVE LAMINOTOMY AND MICRODISCECTOMY    No medications prior to admission.    SH: Social History   Tobacco Use  . Smoking status: Never Smoker  . Smokeless tobacco: Never Used  Substance Use Topics  . Alcohol use: Never    Frequency: Never  . Drug use: Never    MEDS: Prior to Admission medications   Medication Sig Start Date End Date Taking? Authorizing Provider  AIMOVIG 70 MG/ML SOAJ Inject 70 mg into the skin every 30  (thirty) days. 10/08/17  Yes [provider]  cholecalciferol (VITAMIN D) 1000 units tablet Take 1,000 Units by mouth daily.   Yes [provider]  escitalopram (LEXAPRO) 20 MG tablet Take 20 mg by mouth daily.   Yes [provider]  levothyroxine (SYNTHROID, LEVOTHROID) 200 MCG tablet Take 200 mcg by mouth daily before breakfast. Take with 50 mcg to equal 250 mcg daily   Yes [provider]  levothyroxine (SYNTHROID, LEVOTHROID) 50 MCG tablet Take 50 mcg by mouth daily before breakfast. Take with 200 mcg to equal 250 mcg daily   Yes [provider]  metFORMIN (GLUCOPHAGE) 500 MG tablet Take 1,000 mg by mouth 2 (two) times daily. 10/08/17  Yes [provider]  methocarbamol (ROBAXIN) 500 MG tablet Take 1 tablet (500 mg total) by mouth 2 (two) times daily. Patient taking differently: Take 500 mg by mouth 2 (two) times daily as needed for muscle spasms.  10/23/17  Yes Yoav Okane, Vista Mink, PA-C  oxyCODONE-acetaminophen (PERCOCET) 5-325 MG tablet Take 1 tablet by mouth every 4 (four) hours as needed for severe pain. 10/23/17  Yes Shanele Nissan, Vista Mink, PA-C  traMADol (ULTRAM) 50 MG tablet Take 50 mg by mouth 2 (two) times daily as needed for moderate pain.    Yes [provider]    ALLERGY: Allergies  Allergen Reactions  . Doxycycline Hives  . Keflex [Cephalexin] Hives  Social History   Tobacco Use  . Smoking status: Never Smoker  . Smokeless tobacco: Never Used  Substance Use Topics  . Alcohol use: Never    Frequency: Never     No family history on file.   ROS   ROS  Exam   There were no vitals filed for this visit. General appearance: WDWN, NAD Eyes: No scleral injection Cardiovascular: Regular rate and rhythm without murmurs, rubs, gallops. No edema or variciosities. Distal pulses normal. Pulmonary: Effort normal, non-labored breathing Musculoskeletal:     Muscle tone upper extremities: Normal    Muscle tone lower  extremities: Normal    Motor exam: Upper Extremities Deltoid Bicep Tricep Grip  Right 5/5 5/5 5/5 5/5  Left 5/5 5/5 5/5 5/5   Lower Extremity IP Quad PF DF EHL  Right 5/5 5/5 5/5 5/5 5/5  Left 5/5 5/5 5/5 5/5 5/5   Neurological Mental Status:    - Patient is awake, alert, oriented to person, place, month, year, and situation    - Patient is able to give a clear and coherent history.    - No signs of aphasia or neglect Cranial Nerves    - II: Visual Fields are full. Pupils are equal, round, and reactive to light.     - III/IV/VI: EOMI without ptosis or diploplia.     - V: Facial sensation is grossly normal    - VII: Facial movement is symmetric.     - VIII: hearing is intact to voice    - X: Uvula elevates symmetrically    - XI: Shoulder shrug is symmetric.    - XII: tongue is midline without atrophy or fasciculations.  Sensory: Sensation grossly intact to LT  Results - Imaging/Labs   No results found for this or any previous visit (from the past 48 hour(s)).  No results found.  IMAGING: Repeat MRI of the lumbar spine dated 11/27/2017 was reviewed.  This demonstrates recurrent left-sided disc herniation with inferior migration at L4-5, with compression of the left L5 nerve root.  Again seen is bilateral L4 pars defects.  These are confirmed on prior CT scan of the lumbar spine dated 10/20/2017 which was also reviewed.  Impression/Plan   42 y.o. female with recurrent inferiorly migrated left L4-5 disc herniation in the setting of bilateral L4 pars defects.  We will plan on proceeding with L4-5 Gill procedure, interbody arthrodesis, and posterior nonsegmental instrumentation with cortical pedicle screws.  While in the office the risks which include but are not limited to nerve root injury leading to leg or foot weakness/numbness and/or bowel and bladder dysfunction, CSF leak, bleeding, and infection.  Possible outcomes of surgery were also discussed including the possibility of  persistence or worsening of pain symptoms and the possibility of accelerated adjacent level degeneration. The general risks of anesthesia were also reviewed including heart attack, stroke, and DVT/PE.  The patient and her husband understood our discussion and is willing to proceed with surgical decompression and fusion.  All questions were answered.

## 2017-12-18 ENCOUNTER — Other Ambulatory Visit: Payer: Self-pay

## 2017-12-18 ENCOUNTER — Inpatient Hospital Stay (HOSPITAL_COMMUNITY): Payer: BLUE CROSS/BLUE SHIELD

## 2017-12-18 ENCOUNTER — Encounter (HOSPITAL_COMMUNITY)
Admission: RE | Disposition: A | Discharge status: Home / Self Care | Payer: Self-pay | Source: Home / Self Care | Attending: Neurosurgery

## 2017-12-18 ENCOUNTER — Inpatient Hospital Stay (HOSPITAL_COMMUNITY): Payer: BLUE CROSS/BLUE SHIELD | Admitting: Anesthesiology

## 2017-12-18 ENCOUNTER — Encounter (HOSPITAL_COMMUNITY): Payer: Self-pay

## 2017-12-18 ENCOUNTER — Inpatient Hospital Stay (HOSPITAL_COMMUNITY)
Admission: RE | Admit: 2017-12-18 | Discharge: 2017-12-19 | DRG: 460 | Disposition: A | Discharge status: Home / Self Care | Payer: BLUE CROSS/BLUE SHIELD | Attending: Neurosurgery | Admitting: Neurosurgery

## 2017-12-18 DIAGNOSIS — F329 Major depressive disorder, single episode, unspecified: Secondary | ICD-10-CM | POA: Diagnosis not present

## 2017-12-18 DIAGNOSIS — Z79890 Hormone replacement therapy (postmenopausal): Secondary | ICD-10-CM

## 2017-12-18 DIAGNOSIS — Z888 Allergy status to other drugs, medicaments and biological substances status: Secondary | ICD-10-CM | POA: Diagnosis not present

## 2017-12-18 DIAGNOSIS — M4306 Spondylolysis, lumbar region: Secondary | ICD-10-CM | POA: Diagnosis not present

## 2017-12-18 DIAGNOSIS — Z7984 Long term (current) use of oral hypoglycemic drugs: Secondary | ICD-10-CM | POA: Diagnosis not present

## 2017-12-18 DIAGNOSIS — Z79899 Other long term (current) drug therapy: Secondary | ICD-10-CM

## 2017-12-18 DIAGNOSIS — M549 Dorsalgia, unspecified: Secondary | ICD-10-CM | POA: Diagnosis not present

## 2017-12-18 DIAGNOSIS — M47816 Spondylosis without myelopathy or radiculopathy, lumbar region: Secondary | ICD-10-CM | POA: Diagnosis present

## 2017-12-18 DIAGNOSIS — M5126 Other intervertebral disc displacement, lumbar region: Secondary | ICD-10-CM | POA: Diagnosis not present

## 2017-12-18 DIAGNOSIS — M4326 Fusion of spine, lumbar region: Secondary | ICD-10-CM | POA: Diagnosis not present

## 2017-12-18 DIAGNOSIS — Z419 Encounter for procedure for purposes other than remedying health state, unspecified: Secondary | ICD-10-CM

## 2017-12-18 LAB — GLUCOSE, CAPILLARY: Glucose-Capillary: 91 mg/dL (ref 70–99)

## 2017-12-18 LAB — POCT PREGNANCY, URINE: Preg Test, Ur: NEGATIVE

## 2017-12-18 SURGERY — POSTERIOR LUMBAR FUSION 1 LEVEL
Anesthesia: General

## 2017-12-18 MED ORDER — ROCURONIUM BROMIDE 50 MG/5ML IV SOSY
PREFILLED_SYRINGE | INTRAVENOUS | Status: AC
  Filled 2017-12-18: qty 5

## 2017-12-18 MED ORDER — DEXAMETHASONE SODIUM PHOSPHATE 10 MG/ML IJ SOLN
INTRAMUSCULAR | Status: DC | PRN
  Administered 2017-12-18: 10 mg via INTRAVENOUS

## 2017-12-18 MED ORDER — FENTANYL CITRATE (PF) 250 MCG/5ML IJ SOLN
INTRAMUSCULAR | Status: DC | PRN
  Administered 2017-12-18: 50 ug via INTRAVENOUS
  Administered 2017-12-18: 100 ug via INTRAVENOUS
  Administered 2017-12-18 (×2): 50 ug via INTRAVENOUS
  Administered 2017-12-18: 150 ug via INTRAVENOUS
  Administered 2017-12-18: 50 ug via INTRAVENOUS
  Administered 2017-12-18: 100 ug via INTRAVENOUS

## 2017-12-18 MED ORDER — BUPIVACAINE HCL (PF) 0.5 % IJ SOLN
INTRAMUSCULAR | Status: AC
  Filled 2017-12-18: qty 30

## 2017-12-18 MED ORDER — PROPOFOL 10 MG/ML IV BOLUS
INTRAVENOUS | Status: DC | PRN
  Administered 2017-12-18: 200 mg via INTRAVENOUS

## 2017-12-18 MED ORDER — THROMBIN 5000 UNITS EX SOLR
CUTANEOUS | Status: AC
  Filled 2017-12-18: qty 5000

## 2017-12-18 MED ORDER — SODIUM CHLORIDE 0.9% FLUSH: 3.0000 mL | INTRAVENOUS | Status: DC | PRN

## 2017-12-18 MED ORDER — LEVOTHYROXINE SODIUM 100 MCG PO TABS
50.0000 ug | ORAL_TABLET | Freq: Every day | ORAL | Status: DC
  Filled 2017-12-18: qty 1

## 2017-12-18 MED ORDER — FENTANYL CITRATE (PF) 100 MCG/2ML IJ SOLN
25.0000 ug | INTRAMUSCULAR | Status: DC | PRN
  Administered 2017-12-18 (×2): 50 ug via INTRAVENOUS

## 2017-12-18 MED ORDER — HYDROCODONE-ACETAMINOPHEN 5-325 MG PO TABS: 1.0000 | ORAL_TABLET | ORAL | Status: DC | PRN

## 2017-12-18 MED ORDER — LIDOCAINE-EPINEPHRINE 1 %-1:100000 IJ SOLN
INTRAMUSCULAR | Status: AC
  Filled 2017-12-18: qty 1

## 2017-12-18 MED ORDER — PROMETHAZINE HCL 25 MG/ML IJ SOLN: 6.2500 mg | INTRAMUSCULAR | Status: DC | PRN

## 2017-12-18 MED ORDER — METHOCARBAMOL 500 MG PO TABS
500.0000 mg | ORAL_TABLET | Freq: Four times a day (QID) | ORAL | Status: DC | PRN
  Administered 2017-12-18 – 2017-12-19 (×2): 500 mg via ORAL
  Filled 2017-12-18 (×3): qty 1

## 2017-12-18 MED ORDER — PROPOFOL 10 MG/ML IV BOLUS
INTRAVENOUS | Status: AC
  Filled 2017-12-18: qty 20

## 2017-12-18 MED ORDER — PHENYLEPHRINE HCL 10 MG/ML IJ SOLN
INTRAMUSCULAR | Status: DC | PRN
  Administered 2017-12-18: 60 ug via INTRAVENOUS

## 2017-12-18 MED ORDER — METFORMIN HCL 500 MG PO TABS
1000.0000 mg | ORAL_TABLET | Freq: Two times a day (BID) | ORAL | Status: DC
  Administered 2017-12-19: 1000 mg via ORAL
  Filled 2017-12-18: qty 2

## 2017-12-18 MED ORDER — METHOCARBAMOL 1000 MG/10ML IJ SOLN
500.0000 mg | Freq: Four times a day (QID) | INTRAVENOUS | Status: DC | PRN
  Administered 2017-12-19: 500 mg via INTRAVENOUS
  Filled 2017-12-18 (×2): qty 5

## 2017-12-18 MED ORDER — ACETAMINOPHEN 650 MG RE SUPP: 650.0000 mg | RECTAL | Status: DC | PRN

## 2017-12-18 MED ORDER — SUGAMMADEX SODIUM 200 MG/2ML IV SOLN
INTRAVENOUS | Status: DC | PRN
  Administered 2017-12-18: 250 mg via INTRAVENOUS

## 2017-12-18 MED ORDER — LIDOCAINE 2% (20 MG/ML) 5 ML SYRINGE
INTRAMUSCULAR | Status: DC | PRN
  Administered 2017-12-18: 80 mg via INTRAVENOUS

## 2017-12-18 MED ORDER — CHLORHEXIDINE GLUCONATE CLOTH 2 % EX PADS: 6.0000 | MEDICATED_PAD | Freq: Once | CUTANEOUS | Status: DC

## 2017-12-18 MED ORDER — ESCITALOPRAM OXALATE 20 MG PO TABS
20.0000 mg | ORAL_TABLET | Freq: Every day | ORAL | Status: DC
  Administered 2017-12-19: 20 mg via ORAL
  Filled 2017-12-18 (×2): qty 1

## 2017-12-18 MED ORDER — SODIUM CHLORIDE 0.9 % IV SOLN
INTRAVENOUS | Status: DC | PRN
  Administered 2017-12-18: 13:00:00

## 2017-12-18 MED ORDER — VANCOMYCIN HCL 10 G IV SOLR
1250.0000 mg | Freq: Once | INTRAVENOUS | Status: AC
  Administered 2017-12-18: 1250 mg via INTRAVENOUS
  Filled 2017-12-18: qty 1250

## 2017-12-18 MED ORDER — SODIUM CHLORIDE 0.9 % IV SOLN: INTRAVENOUS | Status: DC

## 2017-12-18 MED ORDER — ONDANSETRON HCL 4 MG PO TABS: 4.0000 mg | ORAL_TABLET | Freq: Four times a day (QID) | ORAL | Status: DC | PRN

## 2017-12-18 MED ORDER — ONDANSETRON HCL 4 MG/2ML IJ SOLN
INTRAMUSCULAR | Status: DC | PRN
  Administered 2017-12-18: 4 mg via INTRAVENOUS

## 2017-12-18 MED ORDER — OXYCODONE HCL 5 MG/5ML PO SOLN: 5.0000 mg | Freq: Once | ORAL | Status: DC | PRN

## 2017-12-18 MED ORDER — MIDAZOLAM HCL 2 MG/2ML IJ SOLN
INTRAMUSCULAR | Status: AC
  Filled 2017-12-18: qty 2

## 2017-12-18 MED ORDER — THROMBIN 5000 UNITS EX SOLR
OROMUCOSAL | Status: DC | PRN
  Administered 2017-12-18: 13:00:00 via TOPICAL

## 2017-12-18 MED ORDER — FENTANYL CITRATE (PF) 100 MCG/2ML IJ SOLN
INTRAMUSCULAR | Status: AC
  Administered 2017-12-18: 50 ug via INTRAVENOUS
  Filled 2017-12-18: qty 2

## 2017-12-18 MED ORDER — DOCUSATE SODIUM 100 MG PO CAPS
100.0000 mg | ORAL_CAPSULE | Freq: Two times a day (BID) | ORAL | Status: DC
  Administered 2017-12-18 – 2017-12-19 (×2): 100 mg via ORAL
  Filled 2017-12-18 (×2): qty 1

## 2017-12-18 MED ORDER — OXYCODONE HCL 5 MG PO TABS: 5.0000 mg | ORAL_TABLET | Freq: Once | ORAL | Status: DC | PRN

## 2017-12-18 MED ORDER — FENTANYL CITRATE (PF) 250 MCG/5ML IJ SOLN
INTRAMUSCULAR | Status: AC
  Filled 2017-12-18: qty 5

## 2017-12-18 MED ORDER — ONDANSETRON HCL 4 MG/2ML IJ SOLN
4.0000 mg | Freq: Four times a day (QID) | INTRAMUSCULAR | Status: DC | PRN
  Administered 2017-12-18: 4 mg via INTRAVENOUS
  Filled 2017-12-18: qty 2

## 2017-12-18 MED ORDER — OXYCODONE HCL 5 MG PO TABS
10.0000 mg | ORAL_TABLET | ORAL | Status: DC | PRN
  Administered 2017-12-18 – 2017-12-19 (×5): 10 mg via ORAL
  Filled 2017-12-18 (×5): qty 2

## 2017-12-18 MED ORDER — SODIUM CHLORIDE 0.9% FLUSH: 3.0000 mL | Freq: Two times a day (BID) | INTRAVENOUS | Status: DC

## 2017-12-18 MED ORDER — SCOPOLAMINE 1 MG/3DAYS TD PT72
MEDICATED_PATCH | TRANSDERMAL | Status: AC
  Filled 2017-12-18: qty 1

## 2017-12-18 MED ORDER — MENTHOL 3 MG MT LOZG
1.0000 | LOZENGE | OROMUCOSAL | Status: DC | PRN
  Filled 2017-12-18: qty 9

## 2017-12-18 MED ORDER — 0.9 % SODIUM CHLORIDE (POUR BTL) OPTIME
TOPICAL | Status: DC | PRN
  Administered 2017-12-18: 1000 mL

## 2017-12-18 MED ORDER — LACTATED RINGERS IV SOLN
INTRAVENOUS | Status: DC
  Administered 2017-12-18 (×3): via INTRAVENOUS

## 2017-12-18 MED ORDER — EPHEDRINE SULFATE 50 MG/ML IJ SOLN
INTRAMUSCULAR | Status: DC | PRN
  Administered 2017-12-18: 10 mg via INTRAVENOUS

## 2017-12-18 MED ORDER — LEVOTHYROXINE SODIUM 100 MCG PO TABS
200.0000 ug | ORAL_TABLET | Freq: Every day | ORAL | Status: DC
  Filled 2017-12-18: qty 2

## 2017-12-18 MED ORDER — SENNA 8.6 MG PO TABS
1.0000 | ORAL_TABLET | Freq: Two times a day (BID) | ORAL | Status: DC
  Administered 2017-12-18 – 2017-12-19 (×2): 8.6 mg via ORAL
  Filled 2017-12-18 (×2): qty 1

## 2017-12-18 MED ORDER — ACETAMINOPHEN 325 MG PO TABS
650.0000 mg | ORAL_TABLET | ORAL | Status: DC | PRN
  Administered 2017-12-18: 650 mg via ORAL
  Filled 2017-12-18 (×2): qty 2

## 2017-12-18 MED ORDER — SCOPOLAMINE 1 MG/3DAYS TD PT72
1.0000 | MEDICATED_PATCH | TRANSDERMAL | Status: DC
  Administered 2017-12-18: 1.5 mg via TRANSDERMAL

## 2017-12-18 MED ORDER — SUGAMMADEX SODIUM 500 MG/5ML IV SOLN
INTRAVENOUS | Status: AC
  Filled 2017-12-18: qty 5

## 2017-12-18 MED ORDER — MIDAZOLAM HCL 5 MG/5ML IJ SOLN
INTRAMUSCULAR | Status: DC | PRN
  Administered 2017-12-18: 2 mg via INTRAVENOUS

## 2017-12-18 MED ORDER — ROCURONIUM BROMIDE 10 MG/ML (PF) SYRINGE
PREFILLED_SYRINGE | INTRAVENOUS | Status: DC | PRN
  Administered 2017-12-18 (×2): 50 mg via INTRAVENOUS

## 2017-12-18 MED ORDER — VITAMIN D 1000 UNITS PO TABS
1000.0000 [IU] | ORAL_TABLET | Freq: Every day | ORAL | Status: DC
  Administered 2017-12-19: 1000 [IU] via ORAL
  Filled 2017-12-18: qty 1

## 2017-12-18 MED ORDER — PHENOL 1.4 % MT LIQD: 1.0000 | OROMUCOSAL | Status: DC | PRN

## 2017-12-18 MED ORDER — BISACODYL 10 MG RE SUPP: 10.0000 mg | Freq: Every day | RECTAL | Status: DC | PRN

## 2017-12-18 MED ORDER — HEPARIN SODIUM (PORCINE) 5000 UNIT/ML IJ SOLN
5000.0000 [IU] | Freq: Three times a day (TID) | INTRAMUSCULAR | Status: DC
  Filled 2017-12-18 (×2): qty 1

## 2017-12-18 SURGICAL SUPPLY — 74 items
BAG DECANTER FOR FLEXI CONT (MISCELLANEOUS) ×2 IMPLANT
BASKET BONE COLLECTION (BASKET) ×2 IMPLANT
BENZOIN TINCTURE PRP APPL 2/3 (GAUZE/BANDAGES/DRESSINGS) IMPLANT
BIT DRILL 3.5 POWEREASE (BIT) ×2 IMPLANT
BLADE CLIPPER SURG (BLADE) IMPLANT
BLADE SURG 11 STRL SS (BLADE) ×2 IMPLANT
BUR MATCHSTICK NEURO 3.0 LAGG (BURR) ×2 IMPLANT
BUR PRECISION FLUTE 5.0 (BURR) ×2 IMPLANT
CANISTER SUCT 3000ML PPV (MISCELLANEOUS) ×2 IMPLANT
CARTRIDGE OIL MAESTRO DRILL (MISCELLANEOUS) ×1 IMPLANT
CONT SPEC 4OZ CLIKSEAL STRL BL (MISCELLANEOUS) ×2 IMPLANT
COVER BACK TABLE 60X90IN (DRAPES) ×2 IMPLANT
COVER WAND RF STERILE (DRAPES) ×2 IMPLANT
DECANTER SPIKE VIAL GLASS SM (MISCELLANEOUS) ×4 IMPLANT
DERMABOND ADVANCED (GAUZE/BANDAGES/DRESSINGS) ×1
DERMABOND ADVANCED .7 DNX12 (GAUZE/BANDAGES/DRESSINGS) ×1 IMPLANT
DEVICE INTERBODY ELEVATE 23X7 (Cage) ×4 IMPLANT
DIFFUSER DRILL AIR PNEUMATIC (MISCELLANEOUS) ×2 IMPLANT
DRAPE C-ARM 42X72 X-RAY (DRAPES) ×2 IMPLANT
DRAPE C-ARMOR (DRAPES) ×2 IMPLANT
DRAPE LAPAROTOMY 100X72X124 (DRAPES) ×2 IMPLANT
DRAPE SURG 17X23 STRL (DRAPES) ×2 IMPLANT
DRSG OPSITE POSTOP 4X6 (GAUZE/BANDAGES/DRESSINGS) ×2 IMPLANT
DURAPREP 26ML APPLICATOR (WOUND CARE) ×2 IMPLANT
ELECT BLADE 4.0 EZ CLEAN MEGAD (MISCELLANEOUS) ×2
ELECT REM PT RETURN 9FT ADLT (ELECTROSURGICAL) ×2
ELECTRODE BLDE 4.0 EZ CLN MEGD (MISCELLANEOUS) ×1 IMPLANT
ELECTRODE REM PT RTRN 9FT ADLT (ELECTROSURGICAL) ×1 IMPLANT
GAUZE 4X4 16PLY RFD (DISPOSABLE) IMPLANT
GAUZE SPONGE 4X4 12PLY STRL (GAUZE/BANDAGES/DRESSINGS) IMPLANT
GLOVE BIO SURGEON STRL SZ7.5 (GLOVE) IMPLANT
GLOVE BIOGEL PI IND STRL 7.5 (GLOVE) ×2 IMPLANT
GLOVE BIOGEL PI INDICATOR 7.5 (GLOVE) ×2
GLOVE ECLIPSE 7.0 STRL STRAW (GLOVE) ×4 IMPLANT
GLOVE EXAM NITRILE LRG STRL (GLOVE) IMPLANT
GLOVE EXAM NITRILE XL STR (GLOVE) IMPLANT
GLOVE EXAM NITRILE XS STR PU (GLOVE) IMPLANT
GOWN STRL REUS W/ TWL LRG LVL3 (GOWN DISPOSABLE) ×4 IMPLANT
GOWN STRL REUS W/ TWL XL LVL3 (GOWN DISPOSABLE) IMPLANT
GOWN STRL REUS W/TWL 2XL LVL3 (GOWN DISPOSABLE) IMPLANT
GOWN STRL REUS W/TWL LRG LVL3 (GOWN DISPOSABLE) ×4
GOWN STRL REUS W/TWL XL LVL3 (GOWN DISPOSABLE)
HEMOSTAT POWDER KIT SURGIFOAM (HEMOSTASIS) ×2 IMPLANT
KIT BASIN OR (CUSTOM PROCEDURE TRAY) ×2 IMPLANT
KIT INFUSE XX SMALL 0.7CC (Orthopedic Implant) ×2 IMPLANT
KIT POSITION SURG JACKSON T1 (MISCELLANEOUS) ×2 IMPLANT
KIT TURNOVER KIT B (KITS) ×2 IMPLANT
MILL MEDIUM DISP (BLADE) ×2 IMPLANT
NEEDLE HYPO 18GX1.5 BLUNT FILL (NEEDLE) IMPLANT
NEEDLE HYPO 22GX1.5 SAFETY (NEEDLE) ×2 IMPLANT
NEEDLE SPNL 18GX3.5 QUINCKE PK (NEEDLE) IMPLANT
NS IRRIG 1000ML POUR BTL (IV SOLUTION) ×2 IMPLANT
OIL CARTRIDGE MAESTRO DRILL (MISCELLANEOUS) ×2
PACK LAMINECTOMY NEURO (CUSTOM PROCEDURE TRAY) ×2 IMPLANT
PAD ARMBOARD 7.5X6 YLW CONV (MISCELLANEOUS) ×6 IMPLANT
PASTE BONE GRAFTON 5CC (Bone Implant) ×2 IMPLANT
ROD 40MM (Rod) ×2 IMPLANT
ROD SPNL CVD 40X4.75X (Rod) ×2 IMPLANT
SCREW 5.5X30MM (Screw) ×4 IMPLANT
SCREW 5.5X35MM (Screw) ×2 IMPLANT
SCREW BN 35X5.5XMA NS SPNE (Screw) ×2 IMPLANT
SCREW SET SOLERA (Screw) ×4 IMPLANT
SCREW SET SOLERA TI (Screw) ×4 IMPLANT
SPONGE LAP 4X18 RFD (DISPOSABLE) IMPLANT
SPONGE SURGIFOAM ABS GEL 100 (HEMOSTASIS) IMPLANT
STRIP CLOSURE SKIN 1/2X4 (GAUZE/BANDAGES/DRESSINGS) IMPLANT
SUT VIC AB 0 CT1 18XCR BRD8 (SUTURE) ×1 IMPLANT
SUT VIC AB 0 CT1 8-18 (SUTURE) ×1
SUT VICRYL 3-0 RB1 18 ABS (SUTURE) ×2 IMPLANT
SYR 3ML LL SCALE MARK (SYRINGE) IMPLANT
TOWEL GREEN STERILE (TOWEL DISPOSABLE) ×2 IMPLANT
TOWEL GREEN STERILE FF (TOWEL DISPOSABLE) ×2 IMPLANT
TRAY FOLEY MTR SLVR 16FR STAT (SET/KITS/TRAYS/PACK) ×2 IMPLANT
WATER STERILE IRR 1000ML POUR (IV SOLUTION) ×2 IMPLANT

## 2017-12-18 NOTE — Progress Notes (Signed)
RT at bedside to teach pt how to use IS. Pt is currently seeping.  Pt's husband in room.  RT explained how to use IS to husband and instructed him to let RN know if pt needed additional assistance once pt is awake and starts using IS.

## 2017-12-18 NOTE — Anesthesia Preprocedure Evaluation (Addendum)
Anesthesia Evaluation  Patient identified by MRN, date of birth, ID band Patient awake    Reviewed: Allergy & Precautions, NPO status , Patient's Chart, lab work & pertinent test results  History of Anesthesia Complications (+) PONV and history of anesthetic complications  Airway Mallampati: I  TM Distance: >3 FB Neck ROM: Full    Dental  (+) Dental Advisory Given, Teeth Intact   Pulmonary neg pulmonary ROS,    breath sounds clear to auscultation       Cardiovascular negative cardio ROS   Rhythm:Regular Rate:Normal     Neuro/Psych PSYCHIATRIC DISORDERS Depression  Left leg numbness and weakness     GI/Hepatic negative GI ROS, Neg liver ROS,   Endo/Other  Hypothyroidism Morbid obesity  Renal/GU negative Renal ROS  negative genitourinary   Musculoskeletal negative musculoskeletal ROS (+)   Abdominal (+) + obese,   Peds  Hematology negative hematology ROS (+)   Anesthesia Other Findings   Reproductive/Obstetrics  PCOS                             Anesthesia Physical Anesthesia Plan  ASA: III  Anesthesia Plan: General   Post-op Pain Management:    Induction: Intravenous  PONV Risk Score and Plan: 4 or greater and Treatment may vary due to age or medical condition, Ondansetron, Dexamethasone, Midazolam and Scopolamine patch - Pre-op  Airway Management Planned: Oral ETT  Additional Equipment: None  Intra-op Plan:   Post-operative Plan: Extubation in OR  Informed Consent: I have reviewed the patients History and Physical, chart, labs and discussed the procedure including the risks, benefits and alternatives for the proposed anesthesia with the patient or authorized representative who has indicated his/her understanding and acceptance.   Dental advisory given  Plan Discussed with: CRNA and Anesthesiologist  Anesthesia Plan Comments:       Anesthesia Quick Evaluation

## 2017-12-18 NOTE — Transfer of Care (Signed)
Immediate Anesthesia Transfer of Care Note  Patient: Laiana Fratus  Procedure(s) Performed: POSTERIOR LUMBAR INTERBODY FUSION LUMBAR FOUR- LUMBAR FIVE, INTERBODY DEVICE, INTERBODY ARTHRODESIS, POSTERIOR NON-SEGEMENTAL INSTRUMENTATION (N/A )  Patient Location: PACU  Anesthesia Type:General  Level of Consciousness: awake, oriented, drowsy and patient cooperative  Airway & Oxygen Therapy: Patient Spontanous Breathing  Post-op Assessment: Report given to RN and Post -op Vital signs reviewed and stable  Post vital signs: Reviewed and stable  Last Vitals:  Vitals Value Taken Time  BP 139/83 12/18/2017  5:39 PM  Temp 36.5 C 12/18/2017  5:40 PM  Pulse 124 12/18/2017  5:43 PM  Resp 20 12/18/2017  5:43 PM  SpO2 94 % 12/18/2017  5:43 PM  Vitals shown include unvalidated device data.  Last Pain:  Vitals:   12/18/17 1740  TempSrc:   PainSc: Asleep      Patients Stated Pain Goal: 3 (42/59/56 3875)  Complications: No apparent anesthesia complications

## 2017-12-18 NOTE — Anesthesia Procedure Notes (Signed)
Procedure Name: Intubation Date/Time: 12/18/2017 1:47 PM Performed by: Mariea Clonts, CRNA Pre-anesthesia Checklist: Patient identified, Emergency Drugs available, Suction available and Patient being monitored Patient Re-evaluated:Patient Re-evaluated prior to induction Oxygen Delivery Method: Circle System Utilized Preoxygenation: Pre-oxygenation with 100% oxygen Induction Type: IV induction Ventilation: Mask ventilation without difficulty and Oral airway inserted - appropriate to patient size Laryngoscope Size: Sabra Heck and 2 Grade View: Grade I Tube type: Oral Tube size: 7.5 mm Number of attempts: 1 Airway Equipment and Method: Stylet and Oral airway Placement Confirmation: ETT inserted through vocal cords under direct vision,  positive ETCO2 and breath sounds checked- equal and bilateral Tube secured with: Tape Dental Injury: Teeth and Oropharynx as per pre-operative assessment

## 2017-12-18 NOTE — Progress Notes (Signed)
Pharmacy Antibiotic Note  Amber Moss is a 42 y.o. female admitted on 12/18/2017 with surgical prophylaxis.  Pharmacy has been consulted for vanc dosing.  Pt is s/p spinal surgery. She doesn't have a drain placed so will only need one dose of IV vanc post op.   Plan: Vanc 1.25g IV x1  Rx sign off  Height: 5\' 4"  (162.6 cm) Weight: 262 lb 4.8 oz (119 kg) IBW/kg (Calculated) : 54.7  Temp (24hrs), Avg:98.1 F (36.7 C), Min:97.7 F (36.5 C), Max:98.7 F (37.1 C)  No results for input(s): WBC, CREATININE, LATICACIDVEN, VANCOTROUGH, VANCOPEAK, VANCORANDOM, GENTTROUGH, GENTPEAK, GENTRANDOM, TOBRATROUGH, TOBRAPEAK, TOBRARND, AMIKACINPEAK, AMIKACINTROU, AMIKACIN in the last 168 hours.  Estimated Creatinine Clearance: 117.5 mL/min (by C-G formula based on SCr of 0.63 mg/dL).    Allergies  Allergen Reactions  . Doxycycline Hives  . Keflex [Cephalexin] Hives    Onnie Boer, PharmD, [place], [place], CPP Infectious Disease Pharmacist Pager: [phone] 12/18/2017 7:50 PM

## 2017-12-18 NOTE — Op Note (Signed)
NEUROSURGERY OPERATIVE NOTE   PREOP DIAGNOSIS:  1. 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 L4 Gill procedure 2. Placement of anterior interbody device - [employer] expandable cage x2 3. Posterior non-segmental instrumentation using cortical pedicle screws at L4 - L5 4. Interbody arthrodesis, L4-5 5. Use of locally harvested bone autograft 6. Use of non-structural bone allograft - BMP-2, Grafton  SURGEON: Dr. Consuella Lose, MD  ASSISTANT: Dr. Ashok Pall, MD  ANESTHESIA: General Endotracheal  EBL: 200cc  SPECIMENS: None  DRAINS: None  COMPLICATIONS: None immediate  CONDITION: Hemodynamically stable to PACU  HISTORY: Amber Moss is a 42 y.o. female who has been followed in the outpatient clinic with back and leg pain primarily on the left.  She initially underwent left-sided laminotomy and microdiscectomy for a large inferiorly migrated disc fragment.  Although she got several weeks of good pain relief, she had recurrent disc herniation.  She did have bilateral pars defects at L4, and likely instability at this level as the underlying cause of the recurrent disc herniation.  After lengthy discussion of treatment options including repeat discectomy versus surgical decompression and fusion, she elected to proceed with the fusion procedure.  Risks and benefits were reviewed and consent was obtained.  PROCEDURE IN DETAIL: After informed consent was obtained and witnessed, the patient was brought to the operating room. After induction of general anesthesia, the patient was positioned on the operative table in the prone position. All pressure points were meticulously padded. Incision was then marked out and prepped and draped in the usual sterile fashion.  After timeout was conducted, previous skin incision was then made sharply and Bovie electrocautery was used to dissect the subcutaneous tissue until the lumbodorsal fascia was  identified and incised. The muscle was then elevated in the subperiosteal plane and the L4 and L5 lamina and L4-5 facet complexes were identified. Self-retaining retractors were then placed.  The previous left-sided primarily L5 laminotomy defect was identified.  At this point attention was turned to decompression. Complete L4 laminectomy with radical facetectomy was completed using a combination of Kerrison rongeurs and a high-speed drill.  There was clearly a defect in the pars bilaterally at L4.  Once the laminectomy and radical facetectomy was completed, I was able to identify the L4 nerve roots as well as the traversing L5 nerve roots.  Good decompression of the exiting and traversing roots was confirmed.  Of note, the L4-5 disc appeared to be partially calcified and diffusely bulging, with a large inferiorly migrated fragment identified within the axilla of the left L5 nerve root.  This was removed with a combination of ball-tipped dissectors.  Disc space was then identified, incised, and using a combination of shavers, curettes and rongeurs, complete discectomy was completed. Endplates were prepared, and bone harvested during decompression was mixed with BMP and grafton and packed into the interspace. A 7 mm expandable cage was tapped into place bilaterally.  Cages were then expanded maximally.  Good position was confirmed with fluoroscopy.  At this point, the L4 and L5 entry points were identified with fluoroscopy.  Cortical pedicle screws trajectories were drilled and tapped to 5.5 mm. Screws were then placed in L4 and L5. Rod was then placed, set screws placed and final tightened. Final AP and lateral fluoroscopic images confirmed good position.  The wound was then irrigated with copious amounts of antibiotic saline, then closed in standard fashion using a combination of interrupted 0 and 3-0 Vicryl stitches in the  muscular, fascial, and subcutaneous layers. Skin was then closed using standard  Dermabond. Sterile dressing was then applied. The patient was then transferred to the stretcher, extubated, and taken to the postanesthesia care unit in stable hemodynamic condition.  At the end of the case all sponge, needle, cottonoid, and instrument counts were correct.

## 2017-12-18 NOTE — OR Nursing (Signed)
1337 PATIENT ARRIVED TO OR SUITE WITH PIERCING TO LEFT EAR CARTILAGE NOT ABLE TO REMOVE ,DR Fransisco Beau STATED AWARE IT IS IN AND CRNA PLACED TAPE OVER EAR

## 2017-12-19 MED ORDER — OXYCODONE HCL 5 MG PO TABS: 5.0000 mg | ORAL_TABLET | ORAL | 0 refills | Status: DC | PRN

## 2017-12-19 MED ORDER — METHOCARBAMOL 500 MG PO TABS: 500.0000 mg | ORAL_TABLET | Freq: Four times a day (QID) | ORAL | 1 refills | Status: DC | PRN

## 2017-12-19 NOTE — Progress Notes (Signed)
Patient is discharged from room 3C10 at this time. Alert and in stable condition. IV site d/c'd and instructions read to patient and spouse with understanding verbalized. Left unit via wheelchair with all belongings at side.  

## 2017-12-19 NOTE — Anesthesia Postprocedure Evaluation (Signed)
Anesthesia Post Note  Patient: Amber Moss  Procedure(s) Performed: POSTERIOR LUMBAR INTERBODY FUSION LUMBAR FOUR- LUMBAR FIVE, INTERBODY DEVICE, INTERBODY ARTHRODESIS, POSTERIOR NON-SEGEMENTAL INSTRUMENTATION (N/A )     Patient location during evaluation: PACU Anesthesia Type: General Level of consciousness: awake and alert Pain management: pain level controlled Vital Signs Assessment: post-procedure vital signs reviewed and stable Respiratory status: spontaneous breathing, nonlabored ventilation and respiratory function stable Cardiovascular status: blood pressure returned to baseline and stable Postop Assessment: no apparent nausea or vomiting Anesthetic complications: no    Last Vitals:  Vitals:   12/18/17 2257 12/19/17 0425  BP: (!) 132/59 120/61  Pulse: (!) 101 94  Resp: 18 18  Temp: 36.8 C 36.8 C  SpO2: 100% 98%    Last Pain:  Vitals:   12/19/17 0520  TempSrc:   PainSc: 6                  Jacquel Mccamish,W. EDMOND

## 2017-12-19 NOTE — Evaluation (Addendum)
Occupational Therapy Evaluation Patient Details Name: Amber Moss MRN: [id number] DOB: 1975/11/29 Today's Date: 12/19/2017    History of Present Illness 42 yo female s/p L4-5 decompression and fusion on 12/18/17. PMH including lumbar lami/decompression (10/22/17), depression, and bilateral carpal tunnel release.    Clinical Impression   PTA, pt was living with her husband and was independent and working. Pt currently requiring supervision for ADLs and functional mobility. Providing education on back precautions, brace management, bed mobility, LB ADLs, toileting, and tub transfer. Recommend dc home once medically stable per physician. All acute OT needs met and questions answered. Will sign off. Thank you.     Follow Up Recommendations  No OT follow up;Supervision - Intermittent    Equipment Recommendations  None recommended by OT    Recommendations for Other Services       Precautions / Restrictions Precautions Precautions: Back Precaution Booklet Issued: Yes (comment) Precaution Comments: Pt requiring Min VCs to recall no lift and twist. Pt adhering throughout ADLs Restrictions Weight Bearing Restrictions: No      Mobility Bed Mobility Overal bed mobility: Needs Assistance Bed Mobility: Rolling;Sidelying to Sit Rolling: Supervision Sidelying to sit: Supervision       General bed mobility comments: Educating pt on log roll technique and pt performing with supervision  Transfers Overall transfer level: Needs assistance Equipment used: None Transfers: Sit to/from Stand Sit to Stand: Supervision         General transfer comment: supervision for safety    Balance Overall balance assessment: Needs assistance Sitting-balance support: No upper extremity supported;Feet supported Sitting balance-Leahy Scale: Good     Standing balance support: Single extremity supported Standing balance-Leahy Scale: Fair                             ADL either  performed or assessed with clinical judgement   ADL Overall ADL's : Needs assistance/impaired Eating/Feeding: Independent   Grooming: Modified independent;Standing Grooming Details (indicate cue type and reason): educated on compensatory techniques for grooming at sink, patinet verbalizing "I've been squatting" Upper Body Bathing: Supervision/ safety;Standing   Lower Body Bathing: Sit to/from stand;Cueing for compensatory techniques;Cueing for back precautions;Min guard Lower Body Bathing Details (indicate cue type and reason): Educating pt on compensatory techniques Upper Body Dressing : Set up;Sitting Upper Body Dressing Details (indicate cue type and reason): Pt donning shirt and brace with supervision Lower Body Dressing: Set up;Sit to/from stand;Cueing for safety;Cueing for back precautions Lower Body Dressing Details (indicate cue type and reason): educated on compensatory techniques for LB dressing to adhere at back precautions. Pt donning underwear and pants with supervision Toilet Transfer: Modified Independent;Regular Toilet;Ambulation Toilet Transfer Details (indicate cue type and reason): no assistance, good technique for back precaution adherance Toileting- Clothing Manipulation and Hygiene: Supervision/safety;Sit to/from stand;Cueing for back precautions Toileting - Clothing Manipulation Details (indicate cue type and reason): reviewed technique for compensation and back precautions. Educating pt on AE to optimize independence Tub/ Shower Transfer: Min guard;Ambulation;Tub transfer Tub/Shower Transfer Details (indicate cue type and reason): educated on technique for stepping over tub, patient requires min guard for safety and signifincant other will assist Functional mobility during ADLs: Modified independent General ADL Comments: Providing education on back precautions, brace management, bed mobility, LB ADLs, toileting, and tub transfer.      Vision   Vision Assessment?: No  apparent visual deficits     Perception     Praxis      Pertinent Vitals/Pain Pain  Assessment: Faces Faces Pain Scale: Hurts little more Pain Location: Incision site on back Pain Descriptors / Indicators: Discomfort;Operative site guarding;Dull Pain Intervention(s): Monitored during session;Limited activity within patient's tolerance;Repositioned;Premedicated before session     Hand Dominance     Extremity/Trunk Assessment Upper Extremity Assessment Upper Extremity Assessment: Overall WFL for tasks assessed   Lower Extremity Assessment Lower Extremity Assessment: Defer to PT evaluation   Cervical / Trunk Assessment Cervical / Trunk Assessment: Other exceptions Cervical / Trunk Exceptions: s/p lumbar sx   Communication Communication Communication: No difficulties   Cognition Arousal/Alertness: Awake/alert Behavior During Therapy: WFL for tasks assessed/performed Overall Cognitive Status: Within Functional Limits for tasks assessed                                 General Comments: Slightly lethargic due to medication. Pt stating "I am loopy"   General Comments  Husband present throughout session    Exercises     Shoulder Instructions      Home Living Family/patient expects to be discharged to:: Private residence Living Arrangements: Spouse/significant other Available Help at Discharge: Family Type of Home: House Home Access: Stairs to enter [employer] of Steps: 3 Entrance Stairs-Rails: None Home Layout: Two level;Laundry or work area in basement     [employer] Shower/Tub: Occupational psychologist: None          Prior Functioning/Environment Level of Independence: Independent        Comments: independent and working; after injury required assistance         OT Problem List: Decreased activity tolerance;Decreased safety awareness;Decreased knowledge of use of DME or AE;Decreased  knowledge of precautions;Pain      OT Treatment/Interventions:      OT Goals(Current goals can be found in the care plan section) Acute Rehab OT Goals Patient Stated Goal: home today OT Goal Formulation: All assessment and education complete, DC therapy  OT Frequency:     Barriers to D/C:            Co-evaluation              AM-PAC PT "6 Clicks" Daily Activity     Outcome Measure Help from another person eating meals?: None Help from another person taking care of personal grooming?: None Help from another person toileting, which includes using toliet, bedpan, or urinal?: A Little Help from another person bathing (including washing, rinsing, drying)?: A Little Help from another person to put on and taking off regular upper body clothing?: None Help from another person to put on and taking off regular lower body clothing?: None 6 Click Score: 22   End of Session Equipment Utilized During Treatment: Back brace Nurse Communication: Mobility status  Activity Tolerance: Patient tolerated treatment well Patient left: in bed;with call bell/phone within reach;with family/visitor present(seated EOB)  OT Visit Diagnosis: Pain Pain - Right/Left: Left Pain - part of body: Leg(back)                Time: 0981-1914 OT Time Calculation (min): 14 min Charges:  OT General Charges $OT Visit: 1 Visit OT Evaluation $OT Eval Low Complexity: 1 Low  [place], OTR/L Acute Rehab Pager: [phone] Office: [place] 12/19/2017, 8:46 AM

## 2017-12-19 NOTE — Progress Notes (Signed)
Subjective: Patient reports doing well  Objective: Vital signs in last 24 hours: Temp:  [97.7 F (36.5 C)-98.7 F (37.1 C)] 98.3 F (36.8 C) (10/12 0425) Pulse Rate:  [76-123] 94 (10/12 0425) Resp:  [18-20] 18 (10/12 0425) BP: (120-153)/(58-83) 120/61 (10/12 0425) SpO2:  [95 %-100 %] 98 % (10/12 0425) Weight:  [409 kg] 119 kg (10/11 1041)  Intake/Output from previous day: 10/11 0701 - 10/12 0700 In: 2050 [I.V.:2000; IV Piggyback:50] Out: 8119 [Urine:1150; Blood:200] Intake/Output this shift: Total I/O In: 50 [IV Piggyback:50] Out: -   Physical Exam: Full strengh both legs.  No numbness.  Dressing CDI.  Lab Results: No results for input(s): WBC, HGB, HCT, PLT in the last 72 hours. BMET No results for input(s): NA, K, CL, CO2, GLUCOSE, BUN, CREATININE, CALCIUM in the last 72 hours.  Studies/Results: Dg Lumbar Spine 2-3 Views  Result Date: 12/18/2017 CLINICAL DATA:  Posterior lumbar interbody fusion, L4-L5. EXAM: DG C-ARM 61-120 MIN; LUMBAR SPINE - 2-3 VIEW COMPARISON:  None. FINDINGS: AP and lateral intraoperative fluoroscopic views of the lumbar spine are provided, demonstrating fixation hardware at the L4-L5 levels with intervening disc graft. Hardware appears intact and appropriately positioned. Fluoroscopy was provided for 38 seconds. IMPRESSION: Intraoperative fluoroscopic images demonstrating fixation hardware at the L4-L5 levels. No evidence of surgical complicating feature. Electronically Signed   By: Franki Cabot M.D.   On: 12/18/2017 18:59   Dg C-arm 1-60 Min  Result Date: 12/18/2017 CLINICAL DATA:  Posterior lumbar interbody fusion, L4-L5. EXAM: DG C-ARM 61-120 MIN; LUMBAR SPINE - 2-3 VIEW COMPARISON:  None. FINDINGS: AP and lateral intraoperative fluoroscopic views of the lumbar spine are provided, demonstrating fixation hardware at the L4-L5 levels with intervening disc graft. Hardware appears intact and appropriately positioned. Fluoroscopy was provided for 38  seconds. IMPRESSION: Intraoperative fluoroscopic images demonstrating fixation hardware at the L4-L5 levels. No evidence of surgical complicating feature. Electronically Signed   By: Franki Cabot M.D.   On: 12/18/2017 18:59    Assessment/Plan: Doing well.  Discharge home.    LOS: 1 day    Peggyann Shoals, MD 12/19/2017, 6:19 AM

## 2017-12-19 NOTE — Evaluation (Addendum)
Physical Therapy Evaluation Patient Details Name: Amber Moss MRN: [id number] DOB: [birth year] Today's Date: 12/19/2017   History of Present Illness  Pt is a 42 y/o female s/p left L5 laminotomy and microdisectomy for decompression of thecal sac and nerve roots after onset of L sided back/leg pain, weakness and urinary hesitancy/retention.  No significant PMH.   Clinical Impression  Patient presents with mild dependencies in gait and mobility, mostly limited by dizziness today.  Feel patient safe for d/c with husband assisting.  Discussed progressing mobility at home and reviewed precautions.  Recommend patient follow up with PT in outpatient for progression of functional activities and return to work.      Follow Up Recommendations Outpatient PT    Equipment Recommendations  None recommended by PT    Recommendations for Other Services       Precautions / Restrictions Precautions Precautions: Back Precaution Booklet Issued: Yes (comment) Precaution Comments: able to verbalize 3/3 precautions Restrictions Weight Bearing Restrictions: No      Mobility  Bed Mobility Overal bed mobility: Modified Independent Bed Mobility: Rolling;Sidelying to Sit Rolling: Modified independent (Device/Increase time) Sidelying to sit: Modified independent (Device/Increase time)       General bed mobility comments: able to perform log roll  Transfers Overall transfer level: Needs assistance Equipment used: None Transfers: Sit to/from Stand Sit to Stand: Supervision         General transfer comment: supervision for safety  Ambulation/Gait Ambulation/Gait assistance: Min guard Gait Distance (Feet): 200 Feet Assistive device: 1 person hand held assist(husband provided assistance) Gait Pattern/deviations: Step-through pattern;Wide base of support Gait velocity: decreased      Stairs Stairs: Yes   Stair Management: No rails;Step to pattern;Forwards(with hand held assist of  1) Number of Stairs: 3    Wheelchair Mobility    Modified Rankin (Stroke Patients Only)       Balance Overall balance assessment: Needs assistance Sitting-balance support: No upper extremity supported;Feet supported Sitting balance-Leahy Scale: Good     Standing balance support: No upper extremity supported Standing balance-Leahy Scale: Fair Standing balance comment: patient limited by dizziness today                             Pertinent Vitals/Pain Pain Assessment: 0-10 Pain Score: 3  Faces Pain Scale: Hurts little more Pain Location: Incision site on back Pain Descriptors / Indicators: Discomfort;Operative site guarding;Dull Pain Intervention(s): Monitored during session;Limited activity within patient's tolerance    Home Living Family/patient expects to be discharged to:: Private residence Living Arrangements: Spouse/significant other Available Help at Discharge: Family Type of Home: House Home Access: Stairs to enter Entrance Stairs-Rails: None Entrance Stairs-Number of Steps: 3 Home Layout: Two level;Laundry or work area in [employer]: None      Prior Function Level of Independence: Independent         Comments: independent and working; 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, clinical (histocompatibility and immunogenetics)        Extremity/Trunk Assessment   Upper Extremity Assessment Upper Extremity Assessment: Overall WFL for tasks assessed    Lower Extremity Assessment Lower Extremity Assessment: Overall WFL for tasks assessed    Cervical / Trunk Assessment Cervical / Trunk Assessment: Other exceptions Cervical / Trunk Exceptions: s/p lumbar sx  Communication   Communication: No difficulties  Cognition Arousal/Alertness: Awake/alert Behavior During Therapy: WFL for tasks assessed/performed Overall Cognitive Status: Within Functional Limits for tasks assessed  General Comments General comments  (skin integrity, edema, etc.): Husband present throughout session    Exercises     Assessment/Plan    PT Assessment All further PT needs can be met in the next venue of care  PT Problem List Decreased strength;Decreased activity tolerance;Decreased balance;Decreased mobility;Decreased knowledge of precautions;Pain       PT Treatment Interventions Gait training;Functional mobility training;Balance training;Therapeutic exercise;Therapeutic activities;Patient/family education    PT Goals (Current goals can be found in the Care Plan section)  Acute Rehab PT Goals Patient Stated Goal: home today PT Goal Formulation: All assessment and education complete, DC therapy    Frequency     Barriers to discharge        Co-evaluation               AM-PAC PT "6 Clicks" Daily Activity  Outcome Measure Difficulty turning over in bed (including adjusting bedclothes, sheets and blankets)?: A Little Difficulty moving from lying on back to sitting on the side of the bed? : A Little Difficulty sitting down on and standing up from a chair with arms (e.g., wheelchair, bedside commode, etc,.)?: A Little Help needed moving to and from a bed to chair (including a wheelchair)?: A Little Help needed walking in hospital room?: A Little Help needed climbing 3-5 steps with a railing? : A Little 6 Click Score: 18    End of Session Equipment Utilized During Treatment: Back brace Activity Tolerance: Patient tolerated treatment well Patient left: in bed;with call bell/phone within reach;with family/visitor present Nurse Communication: Mobility status PT Visit Diagnosis: Unsteadiness on feet (R26.81);Other abnormalities of gait and mobility (R26.89);Muscle weakness (generalized) (M62.81)    Time: 8299-3716 PT Time Calculation (min) (ACUTE ONLY): 12 min   Charges:   PT Evaluation $PT Eval Low Complexity: 1 Low          12/19/2017 [place], PT Acute Rehabilitation Services Pager:   [phone] Office:  [phone]    Shanna Cisco 12/19/2017, 9:28 AM

## 2017-12-19 NOTE — Discharge Summary (Signed)
Physician Discharge Summary  Patient ID: Amber Moss MRN: [id number] DOB/AGE: 10-27-1975 42 y.o.  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  She was discharged home on AM of POD 1.  Consults: None  Significant Diagnostic Studies: None  Treatments: surgery: Patient underwent decompression and fusion L 45 level,  Discharge Exam: Blood pressure 120/61, pulse 94, temperature 98.3 F (36.8 C), temperature source Oral, resp. rate 18, height 5\' 4"  (1.626 m), weight 119 kg, SpO2 98 %. Neurologic: Alert and oriented X 3, normal strength and tone. Normal symmetric reflexes. Normal coordination and gait Wound:CDI  Disposition: Home  Discharge Instructions    Diet - low sodium heart healthy   Complete by:  As directed    Increase activity slowly   Complete by:  As directed      Allergies as of 12/19/2017      Reactions   Doxycycline Hives   Keflex [cephalexin] Hives      Medication List    TAKE these medications   AIMOVIG 70 MG/ML Soaj Generic drug:  Erenumab-aooe Inject 70 mg into the skin every 30 (thirty) days.   cholecalciferol 1000 units tablet Commonly known as:  VITAMIN D Take 1,000 Units by mouth daily.   escitalopram 20 MG tablet Commonly known as:  LEXAPRO Take 20 mg by mouth daily.   levothyroxine 200 MCG tablet Commonly known as:  SYNTHROID, LEVOTHROID Take 200 mcg by mouth daily before breakfast. Take with 50 mcg to equal 250 mcg daily   levothyroxine 50 MCG tablet Commonly known as:  SYNTHROID, LEVOTHROID Take 50 mcg by mouth daily before breakfast. Take with 200 mcg to equal 250 mcg daily   metFORMIN 500 MG  tablet Commonly known as:  GLUCOPHAGE Take 1,000 mg by mouth 2 (two) times daily.   methocarbamol 500 MG tablet Commonly known as:  ROBAXIN Take 1 tablet (500 mg total) by mouth 2 (two) times daily. What changed:    when to take this  reasons to take this   methocarbamol 500 MG tablet Commonly known as:  ROBAXIN Take 1 tablet (500 mg total) by mouth every 6 (six) hours as needed for muscle spasms. What changed:  You were already taking a medication with the same name, and this prescription was added. Make sure you understand how and when to take each.   oxyCODONE 5 MG immediate release tablet Commonly known as:  Oxy IR/ROXICODONE Take 1-2 tablets (5-10 mg total) by mouth every 4 (four) hours as needed for severe pain ((score 7 to 10)).   oxyCODONE-acetaminophen 5-325 MG tablet Commonly known as:  PERCOCET/ROXICET Take 1 tablet by mouth every 4 (four) hours as needed for severe pain.   traMADol 50 MG tablet Commonly known as:  ULTRAM Take 50 mg by mouth 2 (two) times daily as needed for moderate pain.        Signed: Peggyann Shoals, MD 12/19/2017, 6:22 AM

## 2017-12-19 NOTE — Discharge Instructions (Signed)
Wound Care Remove dressing in 2 days Leave incision open to air. You may shower. Do not scrub directly on incision.  Do not put any creams, lotions, or ointments on incision. Activity Walk each and every day, increasing distance each day. No lifting greater than 5 lbs.  Avoid bending, arching, and twisting. No driving for 2 weeks; may ride as a passenger locally. If provided with back brace, wear when out of bed.  It is not necessary to wear in bed. Diet Resume your normal diet.  Return to Work Will be discussed at you follow up appointment. Call Your Doctor If Any of These Occur Redness, drainage, or swelling at the wound.  Temperature greater than 101 degrees. Severe pain not relieved by pain medication. Incision starts to come apart. Follow Up Appt Call today for appointment in 2-3 weeks ([phone]) or for problems.  If you have any hardware placed in your spine, you will need an x-ray before your appointment.   

## 2018-01-04 DIAGNOSIS — M4306 Spondylolysis, lumbar region: Secondary | ICD-10-CM | POA: Diagnosis not present

## 2018-01-07 DIAGNOSIS — M4326 Fusion of spine, lumbar region: Secondary | ICD-10-CM | POA: Diagnosis not present

## 2018-01-12 DIAGNOSIS — M4326 Fusion of spine, lumbar region: Secondary | ICD-10-CM | POA: Diagnosis not present

## 2018-01-13 ENCOUNTER — Ambulatory Visit (HOSPITAL_COMMUNITY)
Admission: RE | Admit: 2018-01-13 | Discharge: 2018-01-13 | Disposition: A | Discharge status: Home / Self Care | Payer: BLUE CROSS/BLUE SHIELD | Source: Ambulatory Visit | Attending: Physician Assistant | Admitting: Physician Assistant

## 2018-01-13 ENCOUNTER — Other Ambulatory Visit (HOSPITAL_COMMUNITY): Payer: Self-pay | Admitting: Physician Assistant

## 2018-01-13 DIAGNOSIS — R609 Edema, unspecified: Secondary | ICD-10-CM | POA: Diagnosis not present

## 2018-01-13 DIAGNOSIS — R600 Localized edema: Secondary | ICD-10-CM

## 2018-01-13 DIAGNOSIS — M79605 Pain in left leg: Secondary | ICD-10-CM | POA: Diagnosis not present

## 2018-01-18 DIAGNOSIS — M4326 Fusion of spine, lumbar region: Secondary | ICD-10-CM | POA: Diagnosis not present

## 2018-01-20 DIAGNOSIS — M4326 Fusion of spine, lumbar region: Secondary | ICD-10-CM | POA: Diagnosis not present

## 2018-01-25 DIAGNOSIS — R5383 Other fatigue: Secondary | ICD-10-CM | POA: Diagnosis not present

## 2018-01-25 DIAGNOSIS — E559 Vitamin D deficiency, unspecified: Secondary | ICD-10-CM | POA: Diagnosis not present

## 2018-01-25 DIAGNOSIS — L659 Nonscarring hair loss, unspecified: Secondary | ICD-10-CM | POA: Diagnosis not present

## 2018-01-25 DIAGNOSIS — E039 Hypothyroidism, unspecified: Secondary | ICD-10-CM | POA: Diagnosis not present

## 2018-01-26 DIAGNOSIS — M4326 Fusion of spine, lumbar region: Secondary | ICD-10-CM | POA: Diagnosis not present

## 2018-02-03 DIAGNOSIS — R42 Dizziness and giddiness: Secondary | ICD-10-CM | POA: Diagnosis not present

## 2018-02-05 DIAGNOSIS — G43009 Migraine without aura, not intractable, without status migrainosus: Secondary | ICD-10-CM | POA: Diagnosis not present

## 2018-02-05 DIAGNOSIS — F324 Major depressive disorder, single episode, in partial remission: Secondary | ICD-10-CM | POA: Diagnosis not present

## 2018-03-01 DIAGNOSIS — G43019 Migraine without aura, intractable, without status migrainosus: Secondary | ICD-10-CM | POA: Diagnosis not present

## 2018-03-19 DIAGNOSIS — Z1231 Encounter for screening mammogram for malignant neoplasm of breast: Secondary | ICD-10-CM | POA: Diagnosis not present

## 2018-03-23 DIAGNOSIS — Z6841 Body Mass Index (BMI) 40.0 and over, adult: Secondary | ICD-10-CM | POA: Diagnosis not present

## 2018-03-23 DIAGNOSIS — Z01419 Encounter for gynecological examination (general) (routine) without abnormal findings: Secondary | ICD-10-CM | POA: Diagnosis not present

## 2018-04-03 DIAGNOSIS — J209 Acute bronchitis, unspecified: Secondary | ICD-10-CM | POA: Diagnosis not present

## 2018-04-23 DIAGNOSIS — J209 Acute bronchitis, unspecified: Secondary | ICD-10-CM | POA: Diagnosis not present

## 2018-04-23 DIAGNOSIS — J019 Acute sinusitis, unspecified: Secondary | ICD-10-CM | POA: Diagnosis not present

## 2018-05-19 DIAGNOSIS — E039 Hypothyroidism, unspecified: Secondary | ICD-10-CM | POA: Diagnosis not present

## 2018-05-19 DIAGNOSIS — E559 Vitamin D deficiency, unspecified: Secondary | ICD-10-CM | POA: Diagnosis not present

## 2018-05-19 DIAGNOSIS — D519 Vitamin B12 deficiency anemia, unspecified: Secondary | ICD-10-CM | POA: Diagnosis not present

## 2018-06-14 DIAGNOSIS — M545 Low back pain: Secondary | ICD-10-CM | POA: Diagnosis not present

## 2018-06-23 DIAGNOSIS — M4306 Spondylolysis, lumbar region: Secondary | ICD-10-CM | POA: Diagnosis not present

## 2018-06-26 DIAGNOSIS — N644 Mastodynia: Secondary | ICD-10-CM | POA: Diagnosis not present

## 2018-06-30 DIAGNOSIS — R928 Other abnormal and inconclusive findings on diagnostic imaging of breast: Secondary | ICD-10-CM | POA: Diagnosis not present

## 2018-06-30 DIAGNOSIS — N644 Mastodynia: Secondary | ICD-10-CM | POA: Diagnosis not present

## 2018-07-06 DIAGNOSIS — M5127 Other intervertebral disc displacement, lumbosacral region: Secondary | ICD-10-CM | POA: Diagnosis not present

## 2018-07-06 DIAGNOSIS — Z981 Arthrodesis status: Secondary | ICD-10-CM | POA: Diagnosis not present

## 2018-07-06 DIAGNOSIS — M4306 Spondylolysis, lumbar region: Secondary | ICD-10-CM | POA: Diagnosis not present

## 2018-07-10 DIAGNOSIS — M25512 Pain in left shoulder: Secondary | ICD-10-CM | POA: Diagnosis not present

## 2018-07-12 DIAGNOSIS — M4306 Spondylolysis, lumbar region: Secondary | ICD-10-CM | POA: Diagnosis not present

## 2018-07-12 DIAGNOSIS — Z20828 Contact with and (suspected) exposure to other viral communicable diseases: Secondary | ICD-10-CM | POA: Diagnosis not present

## 2018-07-12 DIAGNOSIS — M5126 Other intervertebral disc displacement, lumbar region: Secondary | ICD-10-CM | POA: Diagnosis not present

## 2018-07-12 DIAGNOSIS — M5136 Other intervertebral disc degeneration, lumbar region: Secondary | ICD-10-CM | POA: Diagnosis not present

## 2018-07-19 DIAGNOSIS — M5136 Other intervertebral disc degeneration, lumbar region: Secondary | ICD-10-CM | POA: Diagnosis not present

## 2018-07-19 DIAGNOSIS — R030 Elevated blood-pressure reading, without diagnosis of hypertension: Secondary | ICD-10-CM | POA: Diagnosis not present

## 2018-07-19 DIAGNOSIS — M5126 Other intervertebral disc displacement, lumbar region: Secondary | ICD-10-CM | POA: Diagnosis not present

## 2018-07-19 DIAGNOSIS — M5416 Radiculopathy, lumbar region: Secondary | ICD-10-CM | POA: Diagnosis not present

## 2018-07-30 DIAGNOSIS — M5126 Other intervertebral disc displacement, lumbar region: Secondary | ICD-10-CM | POA: Diagnosis not present

## 2018-07-30 DIAGNOSIS — Z6841 Body Mass Index (BMI) 40.0 and over, adult: Secondary | ICD-10-CM | POA: Diagnosis not present

## 2018-07-30 DIAGNOSIS — R030 Elevated blood-pressure reading, without diagnosis of hypertension: Secondary | ICD-10-CM | POA: Diagnosis not present

## 2018-07-30 DIAGNOSIS — M5416 Radiculopathy, lumbar region: Secondary | ICD-10-CM | POA: Diagnosis not present

## 2018-08-11 DIAGNOSIS — E039 Hypothyroidism, unspecified: Secondary | ICD-10-CM | POA: Diagnosis not present

## 2018-08-11 DIAGNOSIS — R5383 Other fatigue: Secondary | ICD-10-CM | POA: Diagnosis not present

## 2018-08-11 DIAGNOSIS — E559 Vitamin D deficiency, unspecified: Secondary | ICD-10-CM | POA: Diagnosis not present

## 2018-08-11 DIAGNOSIS — D519 Vitamin B12 deficiency anemia, unspecified: Secondary | ICD-10-CM | POA: Diagnosis not present

## 2018-08-11 DIAGNOSIS — R7303 Prediabetes: Secondary | ICD-10-CM | POA: Diagnosis not present

## 2018-08-25 DIAGNOSIS — M5416 Radiculopathy, lumbar region: Secondary | ICD-10-CM | POA: Diagnosis not present

## 2018-08-25 DIAGNOSIS — M5136 Other intervertebral disc degeneration, lumbar region: Secondary | ICD-10-CM | POA: Diagnosis not present

## 2018-10-11 DIAGNOSIS — K760 Fatty (change of) liver, not elsewhere classified: Secondary | ICD-10-CM | POA: Diagnosis not present

## 2018-10-11 DIAGNOSIS — N200 Calculus of kidney: Secondary | ICD-10-CM | POA: Diagnosis not present

## 2018-10-11 DIAGNOSIS — Z9049 Acquired absence of other specified parts of digestive tract: Secondary | ICD-10-CM | POA: Diagnosis not present

## 2018-10-11 DIAGNOSIS — R1031 Right lower quadrant pain: Secondary | ICD-10-CM | POA: Diagnosis not present

## 2018-10-11 DIAGNOSIS — K37 Unspecified appendicitis: Secondary | ICD-10-CM | POA: Diagnosis not present

## 2018-10-11 DIAGNOSIS — R110 Nausea: Secondary | ICD-10-CM | POA: Diagnosis not present

## 2018-10-11 DIAGNOSIS — R109 Unspecified abdominal pain: Secondary | ICD-10-CM | POA: Diagnosis not present

## 2018-10-20 DIAGNOSIS — Z202 Contact with and (suspected) exposure to infections with a predominantly sexual mode of transmission: Secondary | ICD-10-CM | POA: Diagnosis not present

## 2018-10-20 DIAGNOSIS — E039 Hypothyroidism, unspecified: Secondary | ICD-10-CM | POA: Diagnosis not present

## 2018-10-20 DIAGNOSIS — D519 Vitamin B12 deficiency anemia, unspecified: Secondary | ICD-10-CM | POA: Diagnosis not present

## 2018-10-20 DIAGNOSIS — E559 Vitamin D deficiency, unspecified: Secondary | ICD-10-CM | POA: Diagnosis not present

## 2018-11-25 DIAGNOSIS — R300 Dysuria: Secondary | ICD-10-CM | POA: Diagnosis not present

## 2018-12-15 ENCOUNTER — Ambulatory Visit: Payer: BLUE CROSS/BLUE SHIELD | Admitting: Internal Medicine

## 2018-12-22 ENCOUNTER — Other Ambulatory Visit: Payer: Self-pay

## 2018-12-24 ENCOUNTER — Ambulatory Visit (INDEPENDENT_AMBULATORY_CARE_PROVIDER_SITE_OTHER): Payer: BC Managed Care – PPO | Admitting: Internal Medicine

## 2018-12-24 ENCOUNTER — Other Ambulatory Visit: Payer: Self-pay

## 2018-12-24 ENCOUNTER — Encounter: Payer: Self-pay | Admitting: Internal Medicine

## 2018-12-24 VITALS — BP 108/62 | HR 80 | Temp 97.7°F | Ht 64.0 in | Wt 293.2 lb

## 2018-12-24 DIAGNOSIS — E039 Hypothyroidism, unspecified: Secondary | ICD-10-CM

## 2018-12-24 LAB — T4, FREE: Free T4: <0.25 ng/dL — ABNORMAL LOW (ref 0.60–1.60)

## 2018-12-24 LAB — TSH: TSH: 83.36 u[IU]/mL — ABNORMAL HIGH (ref 0.35–4.50)

## 2018-12-24 MED ORDER — LEVOTHYROXINE SODIUM 200 MCG PO TABS: 200.0000 ug | ORAL_TABLET | Freq: Every day | ORAL | 2 refills | Status: DC

## 2018-12-24 NOTE — Patient Instructions (Signed)
You are on Armour thyroid  - which is your thyroid hormone supplement. You MUST take this consistently.  You should take this first thing in the morning on an empty stomach with water. You should not take it with other medications. Wait 53min to 1hr prior to eating. If you are taking any vitamins - please take these in the evening.   If you miss a dose, please take your missed dose the following day (double the dose for that day). You should have a pill box for ONLY Armour Thyroid on your bedside table to help you remember to take your medications.

## 2018-12-24 NOTE — Progress Notes (Signed)
Name: Amber Moss  MRN/ DOB: [id number], 1975-08-31    Age/ Sex: 43 y.o., female    PCP: Rosine Door   Reason for Endocrinology Evaluation: Hypothyroidism     Date of Initial Endocrinology Evaluation: 12/24/2018     HPI: Amber Moss is a 43 y.o. female with a past medical history of Migraine headaches and hypothyroidism. The patient presented for initial endocrinology clinic visit on 12/24/2018 for consultative assistance with her Hypothyroidism .   Pt with hypothyroidism for the past 15 yrs. Was initially on Levothyroxine but eventually was switched to armour thyroid and has been on armour thyroid for ~ 6 months    Was on 120 mg daily but was changed to 180 mg ~ 4 months ago    She is fatigue which is chronic associated with hair loss. Has chronic constipation and depression   No MVI   Medications Armour thyroid 180 mg daily     HISTORY:  Past Medical History:  Past Medical History:  Diagnosis Date  . Depression   . Fatty liver   . PONV (postoperative nausea and vomiting)    Past Surgical History:  Past Surgical History:  Procedure Laterality Date  . ABLATION    . CARPAL TUNNEL RELEASE Bilateral   . CHOLECYSTECTOMY    . DILATION AND CURETTAGE OF UTERUS    . EYE SURGERY    . LUMBAR LAMINECTOMY/DECOMPRESSION MICRODISCECTOMY Left 10/22/2017   Procedure: LUMBAR - FIVE LAMINOTOMY AND MICRODISCECTOMY;  Surgeon: Consuella Lose, MD;  Location: [facility];  Service: Neurosurgery;  Laterality: Left;   LUMBAR - FIVE LAMINOTOMY AND MICRODISCECTOMY      Social History:  reports that she has never smoked. She has never used smokeless tobacco. She reports that she does not drink alcohol or use drugs.  Family History: family history includes Thyroid disease in her maternal grandfather.   HOME MEDICATIONS: Allergies as of 12/24/2018      Reactions   Doxycycline Hives   Keflex [cephalexin] Hives      Medication List       Accurate as of  December 24, 2018  4:52 PM. If you have any questions, ask your nurse or doctor.        STOP taking these medications   escitalopram 20 MG tablet Commonly known as: LEXAPRO Stopped by: Dorita Sciara, MD   methocarbamol 500 MG tablet Commonly known as: ROBAXIN Stopped by: Dorita Sciara, MD   oxyCODONE 5 MG immediate release tablet Commonly known as: Oxy IR/ROXICODONE Stopped by: Dorita Sciara, MD   oxyCODONE-acetaminophen 5-325 MG tablet Commonly known as: Percocet Stopped by: Dorita Sciara, MD   thyroid 180 MG tablet Commonly known as: ARMOUR Stopped by: Dorita Sciara, MD     TAKE these medications   Aimovig 70 MG/ML Soaj Generic drug: Erenumab-aooe Inject 70 mg into the skin every 30 (thirty) days.   cholecalciferol 1000 units tablet Commonly known as: VITAMIN D Take 1,000 Units by mouth daily.   citalopram 40 MG tablet Commonly known as: CELEXA Take 40 mg by mouth daily.   ibuprofen 800 MG tablet Commonly known as: ADVIL Take 800 mg by mouth 3 (three) times daily with meals.   levothyroxine 200 MCG tablet Commonly known as: SYNTHROID Take 1 tablet (200 mcg total) by mouth daily before breakfast. What changed:   additional instructions  Another medication with the same name was removed. Continue taking this medication, and follow the directions you see here. Changed  by: Dorita Sciara, MD   metFORMIN 500 MG tablet Commonly known as: GLUCOPHAGE Take 1,000 mg by mouth 2 (two) times daily.   promethazine 25 MG tablet Commonly known as: PHENERGAN Take 25 mg by mouth every 8 (eight) hours.   rizatriptan 10 MG tablet Commonly known as: MAXALT Take 10 mg by mouth See admin instructions.   traMADol 50 MG tablet Commonly known as: ULTRAM Take 50 mg by mouth 2 (two) times daily as needed for moderate pain.         REVIEW OF SYSTEMS: A comprehensive ROS was conducted with the patient and is negative except as per  HPI and below:  Review of Systems  Constitutional: Positive for malaise/fatigue. Negative for fever and weight loss.  HENT: Negative for congestion and sore throat.   Cardiovascular: Positive for chest pain and palpitations.  Gastrointestinal: Positive for constipation. Negative for nausea.       OBJECTIVE:  VS: BP 108/62 (BP Location: Left Arm, Patient Position: Sitting, Cuff Size: Large)   Pulse 80   Temp 97.7 F (36.5 C)   Ht 5\' 4"  (1.626 m)   Wt 293 lb 3.2 oz (133 kg)   SpO2 97%   BMI 50.33 kg/m    Wt Readings from Last 3 Encounters:  12/24/18 293 lb 3.2 oz (133 kg)  12/18/17 262 lb 4.8 oz (119 kg)  12/11/17 262 lb 4.8 oz (119 kg)     EXAM: General: Pt appears well and is in NAD  Hydration: Well-hydrated with moist mucous membranes and good skin turgor  Eyes: External eye exam normal without stare, lid lag or exophthalmos.  EOM intact.   Ears, Nose, Throat: Hearing: Grossly intact bilaterally Dental: Good dentition  Throat: Clear without mass, erythema or exudate  Neck: General: Supple without adenopathy. Thyroid: Thyroid size normal.  No goiter or nodules appreciated. No thyroid bruit.  Lungs: Clear with good BS bilat with no rales, rhonchi, or wheezes  Heart: Auscultation: RRR.  Abdomen: Normoactive bowel sounds, soft, nontender, without masses or organomegaly palpable  Extremities:  BL LE: No pretibial edema normal ROM and strength.  Skin: Hair: Texture and amount normal with gender appropriate distribution Skin Inspection: No rashes Skin Palpation: Skin temperature, texture, and thickness normal to palpation  Neuro: Cranial nerves: II - XII grossly intact  Motor: Normal strength throughout DTRs: 2+ and symmetric in UE without delay in relaxation phase  Mental Status: Judgment, insight: Intact Orientation: Oriented to time, place, and person Mood and affect: No depression, anxiety, or agitation     DATA REVIEWED:   Results for KAJAH, ARLT (MRN  [id number]) as of 12/24/2018 16:53  Ref. Range 12/24/2018 14:51  TSH Latest Ref Range: 0.35 - 4.50 uIU/mL 83.36 (H)  T4,Free(Direct) Latest Ref Range: 0.60 - 1.60 ng/dL <0.25 (L)    ASSESSMENT/PLAN/RECOMMENDATIONS:   1. Hypothyroidism   - Pt is clinically and biochemically hypothyroid  -  I have advised the patient for my preference to go with Levothyroxine rather then armour thyroid, due to more stability with T4 content in levothyroxine and also armour thyroid has non-physiologic levels of T4:T3 of  4:1 (physiologic levels 13:1 to 16:1) - Pt in agreement in switching to Levothyroxine  - - Pt educated extensively on the correct way to take levothyroxine (first thing in the morning with water, 30 minutes before eating or taking other medications). - Pt encouraged to double dose the following day if she were to miss a dose given long half-life of levothyroxine.  Medications : Stop Armour thyroid  Start Levothyroxine 200 mcg daily   F/U in 4 months  Labs in 8 weeks      Signed electronically by: Mack Guise, MD  [facility]  [address] [place], [place] [address] Phone: [phone] FAX: [phone]   CC: Rosine Door [place] [address] Phone: [phone] Fax: [phone]   Return to Endocrinology clinic as below: Future Appointments  Date Time Provider [place]  04/26/2019  2:20 PM Shamleffer, Melanie Crazier, MD [facility] None

## 2019-01-07 DIAGNOSIS — Z23 Encounter for immunization: Secondary | ICD-10-CM | POA: Diagnosis not present

## 2019-02-15 DIAGNOSIS — M545 Low back pain: Secondary | ICD-10-CM | POA: Diagnosis not present

## 2019-02-15 DIAGNOSIS — M25551 Pain in right hip: Secondary | ICD-10-CM | POA: Diagnosis not present

## 2019-02-28 DIAGNOSIS — E039 Hypothyroidism, unspecified: Secondary | ICD-10-CM | POA: Diagnosis not present

## 2019-02-28 DIAGNOSIS — N979 Female infertility, unspecified: Secondary | ICD-10-CM | POA: Diagnosis not present

## 2019-02-28 DIAGNOSIS — R5383 Other fatigue: Secondary | ICD-10-CM | POA: Diagnosis not present

## 2019-02-28 DIAGNOSIS — E559 Vitamin D deficiency, unspecified: Secondary | ICD-10-CM | POA: Diagnosis not present

## 2019-03-18 DIAGNOSIS — G43909 Migraine, unspecified, not intractable, without status migrainosus: Secondary | ICD-10-CM | POA: Diagnosis not present

## 2019-04-11 DIAGNOSIS — G43009 Migraine without aura, not intractable, without status migrainosus: Secondary | ICD-10-CM | POA: Diagnosis not present

## 2019-04-11 DIAGNOSIS — J209 Acute bronchitis, unspecified: Secondary | ICD-10-CM | POA: Diagnosis not present

## 2019-04-13 DIAGNOSIS — Z20828 Contact with and (suspected) exposure to other viral communicable diseases: Secondary | ICD-10-CM | POA: Diagnosis not present

## 2019-04-14 DIAGNOSIS — J029 Acute pharyngitis, unspecified: Secondary | ICD-10-CM | POA: Diagnosis not present

## 2019-04-26 ENCOUNTER — Ambulatory Visit: Payer: BC Managed Care – PPO | Admitting: Internal Medicine

## 2019-04-29 DIAGNOSIS — B948 Sequelae of other specified infectious and parasitic diseases: Secondary | ICD-10-CM | POA: Diagnosis not present

## 2019-05-25 DIAGNOSIS — N6321 Unspecified lump in the left breast, upper outer quadrant: Secondary | ICD-10-CM | POA: Diagnosis not present

## 2019-05-25 DIAGNOSIS — N6011 Diffuse cystic mastopathy of right breast: Secondary | ICD-10-CM | POA: Diagnosis not present

## 2019-05-25 DIAGNOSIS — N6489 Other specified disorders of breast: Secondary | ICD-10-CM | POA: Diagnosis not present

## 2019-05-25 DIAGNOSIS — R922 Inconclusive mammogram: Secondary | ICD-10-CM | POA: Diagnosis not present

## 2019-05-25 DIAGNOSIS — N644 Mastodynia: Secondary | ICD-10-CM | POA: Diagnosis not present

## 2019-06-01 DIAGNOSIS — M546 Pain in thoracic spine: Secondary | ICD-10-CM | POA: Diagnosis not present

## 2019-06-01 DIAGNOSIS — Z789 Other specified health status: Secondary | ICD-10-CM | POA: Diagnosis not present

## 2019-06-01 DIAGNOSIS — G8929 Other chronic pain: Secondary | ICD-10-CM | POA: Diagnosis not present

## 2019-06-08 DIAGNOSIS — E039 Hypothyroidism, unspecified: Secondary | ICD-10-CM | POA: Diagnosis not present

## 2019-06-08 DIAGNOSIS — E559 Vitamin D deficiency, unspecified: Secondary | ICD-10-CM | POA: Diagnosis not present

## 2019-06-08 DIAGNOSIS — D519 Vitamin B12 deficiency anemia, unspecified: Secondary | ICD-10-CM | POA: Diagnosis not present

## 2019-06-25 DIAGNOSIS — L259 Unspecified contact dermatitis, unspecified cause: Secondary | ICD-10-CM | POA: Diagnosis not present

## 2019-06-25 DIAGNOSIS — Z6841 Body Mass Index (BMI) 40.0 and over, adult: Secondary | ICD-10-CM | POA: Diagnosis not present

## 2019-08-24 DIAGNOSIS — R1030 Lower abdominal pain, unspecified: Secondary | ICD-10-CM | POA: Diagnosis not present

## 2019-08-24 DIAGNOSIS — Z0000 Encounter for general adult medical examination without abnormal findings: Secondary | ICD-10-CM | POA: Diagnosis not present

## 2019-08-24 DIAGNOSIS — R197 Diarrhea, unspecified: Secondary | ICD-10-CM | POA: Diagnosis not present

## 2019-08-25 DIAGNOSIS — E039 Hypothyroidism, unspecified: Secondary | ICD-10-CM | POA: Diagnosis not present

## 2019-08-25 DIAGNOSIS — R197 Diarrhea, unspecified: Secondary | ICD-10-CM | POA: Diagnosis not present

## 2019-08-25 DIAGNOSIS — R1030 Lower abdominal pain, unspecified: Secondary | ICD-10-CM | POA: Diagnosis not present

## 2019-08-25 DIAGNOSIS — R110 Nausea: Secondary | ICD-10-CM | POA: Diagnosis not present

## 2019-09-19 DIAGNOSIS — M545 Low back pain: Secondary | ICD-10-CM | POA: Diagnosis not present
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
## 2019-10-06 ENCOUNTER — Encounter: Payer: Self-pay | Admitting: Neurology

## 2019-10-06 ENCOUNTER — Ambulatory Visit (INDEPENDENT_AMBULATORY_CARE_PROVIDER_SITE_OTHER): Payer: BC Managed Care – PPO | Admitting: Neurology

## 2019-10-06 VITALS — BP 141/83 | HR 66 | Ht 64.0 in | Wt 284.0 lb

## 2019-10-06 DIAGNOSIS — M5417 Radiculopathy, lumbosacral region: Secondary | ICD-10-CM

## 2019-10-06 MED ORDER — PREGABALIN 75 MG PO CAPS: 75.0000 mg | ORAL_CAPSULE | Freq: Two times a day (BID) | ORAL | 3 refills | Status: DC

## 2019-10-06 NOTE — Patient Instructions (Signed)
Start with one pill in the evening to see if you have any side effects then can increase to 2 pills a day  Pregabalin capsules What is this medicine? PREGABALIN (pre GAB a lin) is used to treat nerve pain from diabetes, shingles, spinal cord injury, and fibromyalgia. It is also used to control seizures in epilepsy. This medicine may be used for other purposes; ask your health care provider or pharmacist if you have questions. COMMON BRAND NAME(S): Lyrica What should I tell my health care provider before I take this medicine? They need to know if you have any of these conditions:  heart disease  history of drug abuse or alcohol abuse problem  kidney disease  lung or breathing disease  suicidal thoughts, plans, or attempt; a previous suicide attempt by you or a family member  an unusual or allergic reaction to pregabalin, gabapentin, other medicines, foods, dyes, or preservatives  pregnant or trying to get pregnant  breast-feeding How should I use this medicine? Take this medicine by mouth with a glass of water. Follow the directions on the prescription label. You can take it with or without food. If it upsets your stomach, take it with food. Take your medicine at regular intervals. Do not take it more often than directed. Do not stop taking except on your doctor's advice. A special MedGuide will be given to you by the pharmacist with each prescription and refill. Be sure to read this information carefully each time. Talk to your pediatrician regarding the use of this medicine in children. While this drug may be prescribed for children as young as 1 month for selected conditions, precautions do apply. Overdosage: If you think you have taken too much of this medicine contact a poison control center or emergency room at once. NOTE: This medicine is only for you. Do not share this medicine with others. What if I miss a dose? If you miss a dose, take it as soon as you can. If it is almost  time for your next dose, take only that dose. Do not take double or extra doses. What may interact with this medicine? This medicine may interact with the following medications:  alcohol  antihistamines for allergy, cough, and cold  certain medicines for anxiety or sleep  certain medicines for depression like amitriptyline, fluoxetine, sertraline  certain medicines for diabetes  certain medicines for seizures like phenobarbital, primidone  general anesthetics like halothane, isoflurane, methoxyflurane, propofol  local anesthetics like lidocaine, pramoxine, tetracaine  medicines that relax muscles for surgery  narcotic medicines for pain  phenothiazines like chlorpromazine, mesoridazine, prochlorperazine, thioridazine This list may not describe all possible interactions. Give your health care provider a list of all the medicines, herbs, non-prescription drugs, or dietary supplements you use. Also tell them if you smoke, drink alcohol, or use illegal drugs. Some items may interact with your medicine. What should I watch for while using this medicine? Tell your doctor or healthcare professional if your symptoms do not start to get better or if they get worse. Visit your doctor or health care professional for regular checks on your progress. Do not stop taking except on your doctor's advice. You may develop a severe reaction. Your doctor will tell you how much medicine to take. Wear a medical identification bracelet or chain if you are taking this medicine for seizures, and carry a card that describes your disease and details of your medicine and dosage times. You may get drowsy or dizzy. Do not drive, use machinery,  or do anything that needs mental alertness until you know how this medicine affects you. Do not stand or sit up quickly, especially if you are an older patient. This reduces the risk of dizzy or fainting spells. Alcohol may interfere with the effect of this medicine. Avoid  alcoholic drinks. If you have a heart condition, like congestive heart failure, and notice that you are retaining water and have swelling in your hands or feet, contact your health care provider immediately. The use of this medicine may increase the chance of suicidal thoughts or actions. Pay special attention to how you are responding while on this medicine. Any worsening of mood, or thoughts of suicide or dying should be reported to your health care professional right away. This medicine has caused reduced sperm counts in some men. This may interfere with the ability to father a child. You should talk to your doctor or health care professional if you are concerned about your fertility. Women who become pregnant while using this medicine for seizures may enroll in the [place] Pregnancy Registry by calling [phone]. This registry collects information about the safety of antiepileptic drug use during pregnancy. What side effects may I notice from receiving this medicine? Side effects that you should report to your doctor or health care professional as soon as possible:  allergic reactions like skin rash, itching or hives, swelling of the face, lips, or tongue  breathing problems  changes in vision  chest pain  confusion  jerking or unusual movements of any part of your body  loss of memory  muscle pain, tenderness, or weakness  suicidal thoughts or other mood changes  swelling of the ankles, feet, hands  unusual bruising or bleeding Side effects that usually do not require medical attention (report to your doctor or health care professional if they continue or are bothersome):  dizziness  drowsiness  dry mouth  headache  nausea  tremors  trouble sleeping  weight gain This list may not describe all possible side effects. Call your doctor for medical advice about side effects. You may report side effects to FDA at 1-800-FDA-1088. Where  should I keep my medicine? Keep out of the reach of children. This medicine can be abused. Keep your medicine in a safe place to protect it from theft. Do not share this medicine with anyone. Selling or giving away this medicine is dangerous and against the law. This medicine may cause accidental overdose and death if it taken by other adults, children, or pets. Mix any unused medicine with a substance like cat litter or coffee grounds. Then throw the medicine away in a sealed container like a sealed bag or a coffee can with a lid. Do not use the medicine after the expiration date. Store at room temperature between 15 and 30 degrees C (59 and 86 degrees F). NOTE: This sheet is a summary. It may not cover all possible information. If you have questions about this medicine, talk to your doctor, pharmacist, or health care provider.  2020 Elsevier/Gold Standard (2018-02-26 13:15:55)

## 2019-10-12 IMAGING — US US EXTREM LOW VENOUS*L*
1 series · 13 of 24 positions shown · non-contrast
Comparison: None.

CLINICAL DATA: Left lower extremity pain and edema for the past 3
months. Evaluate for DVT.



[Series 1: us extrem low venous*left* · 0.08mm/px · 13 of 37 slices shown]
[im 1/37]
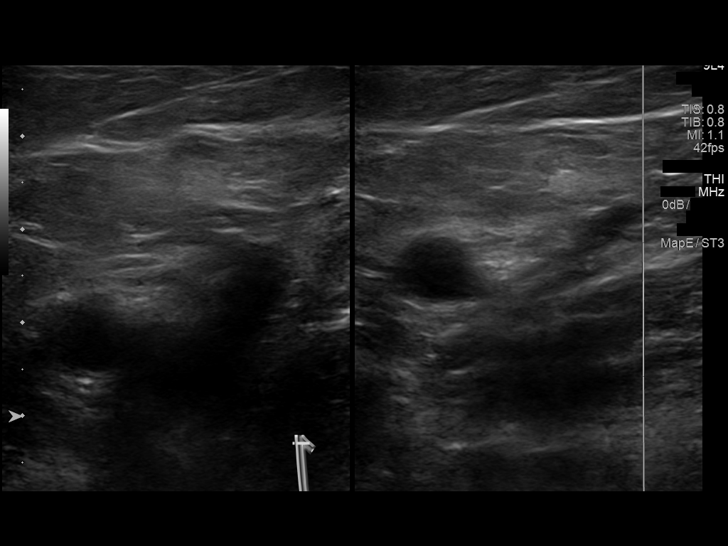
[im 4/37]
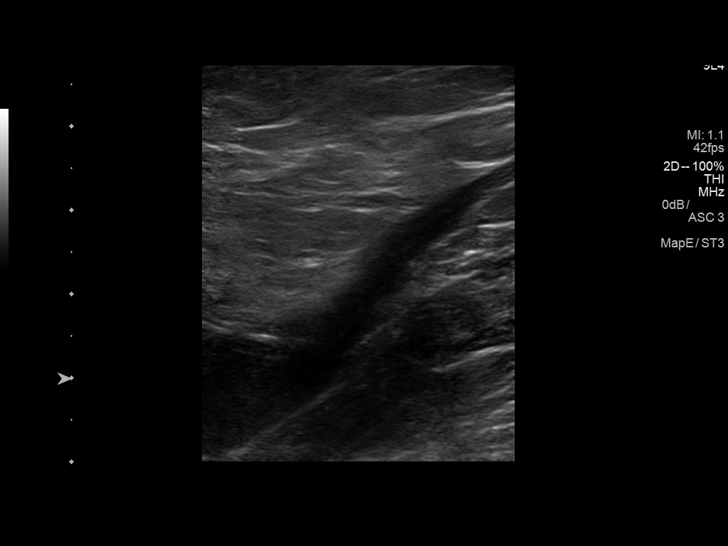
[im 7/37]
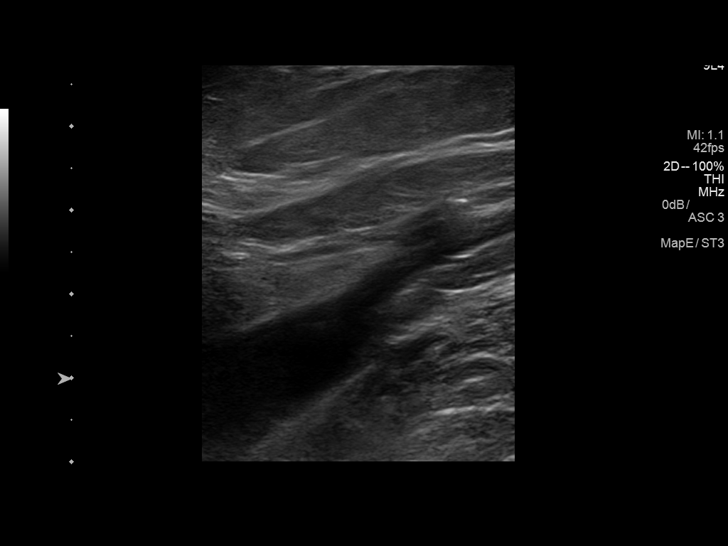
[im 10/37]
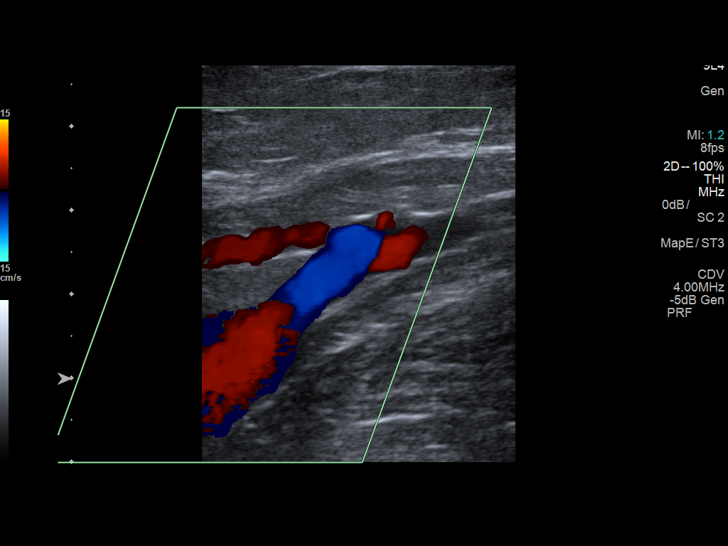
[im 13/37]
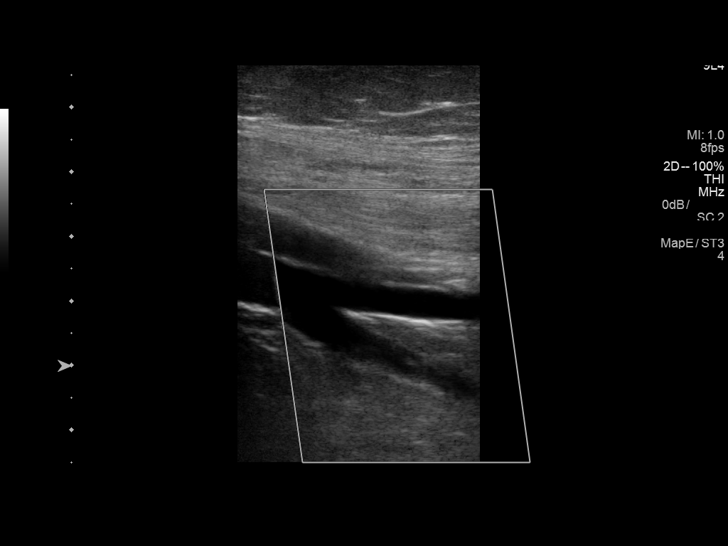
[im 16/37]
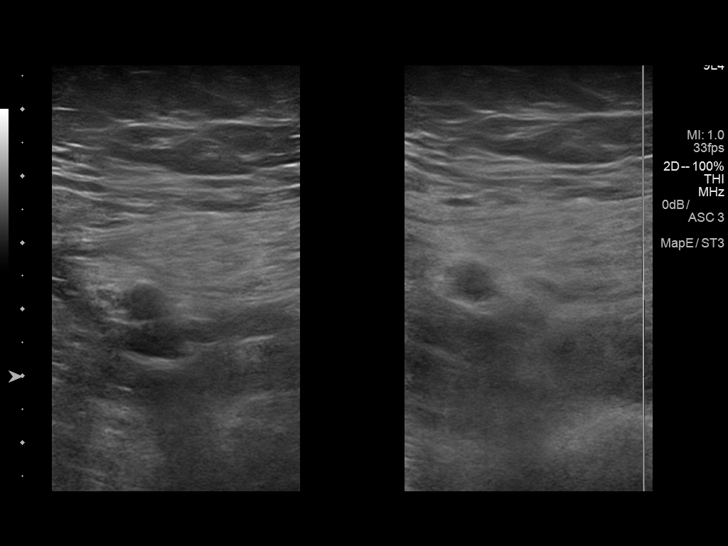
[im 19/37]
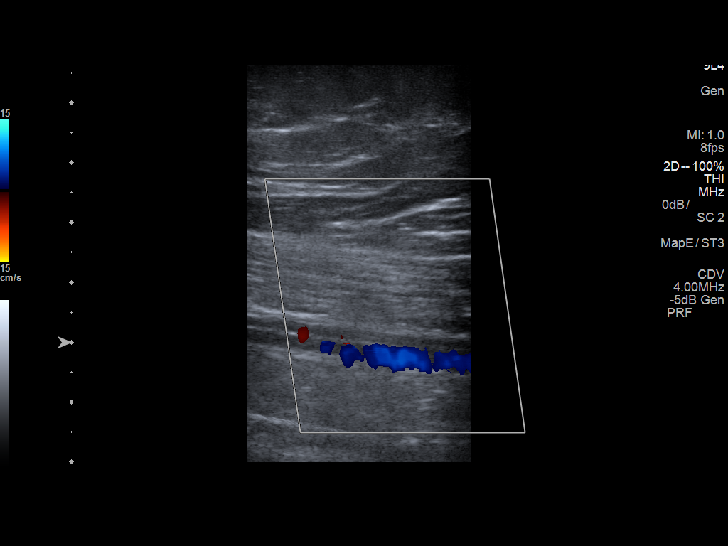
[im 21/37]
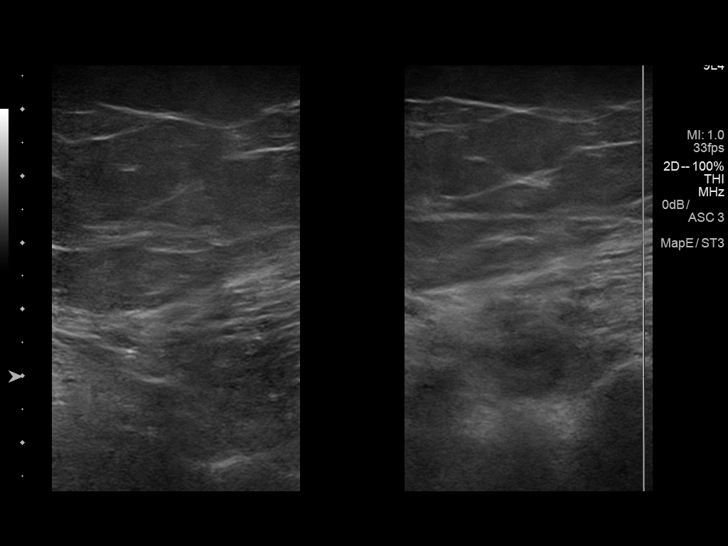
[im 24/37]
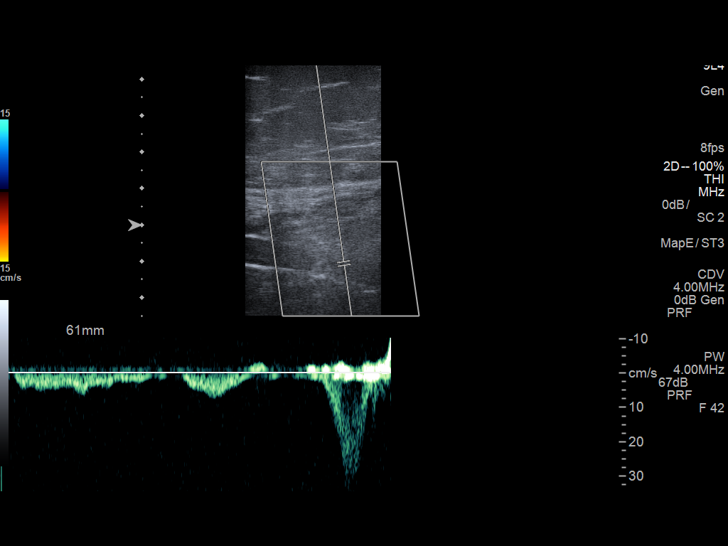
[im 27/37]
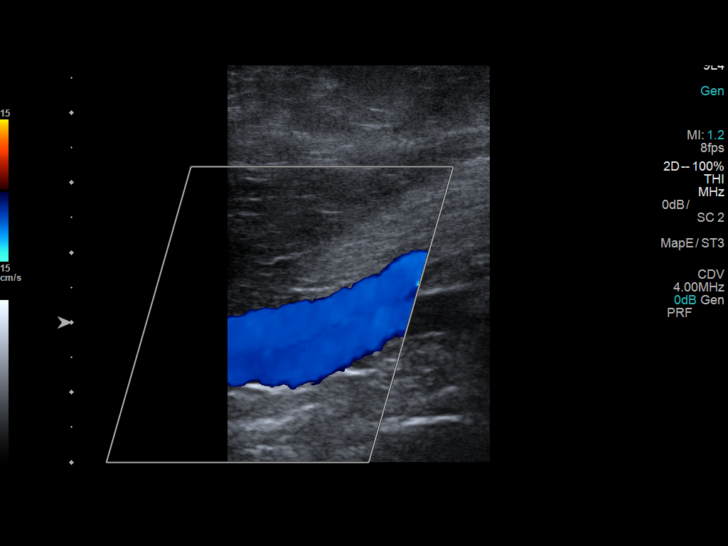
[im 30/37]
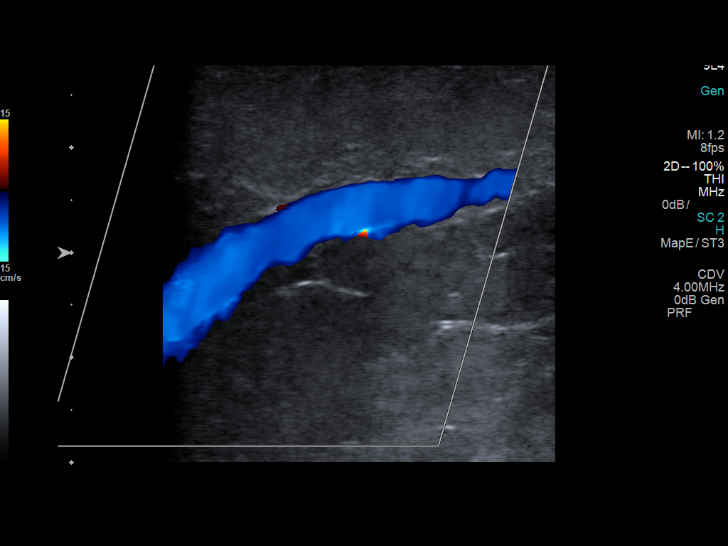
[im 33/37]
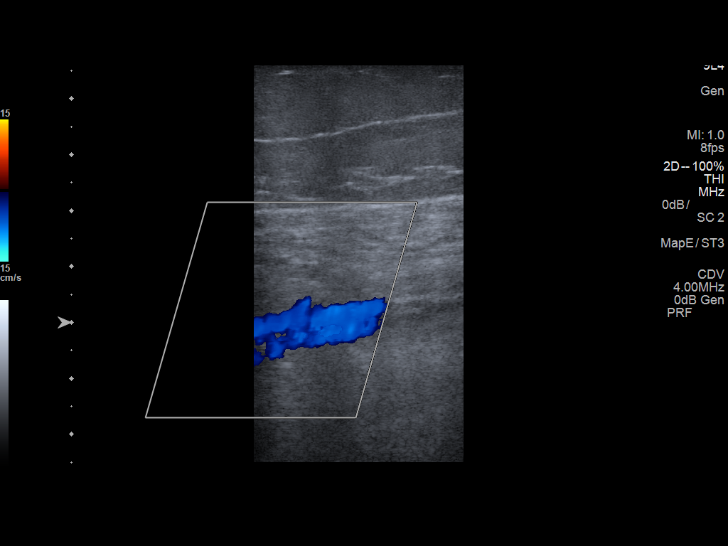
[im 37/37]
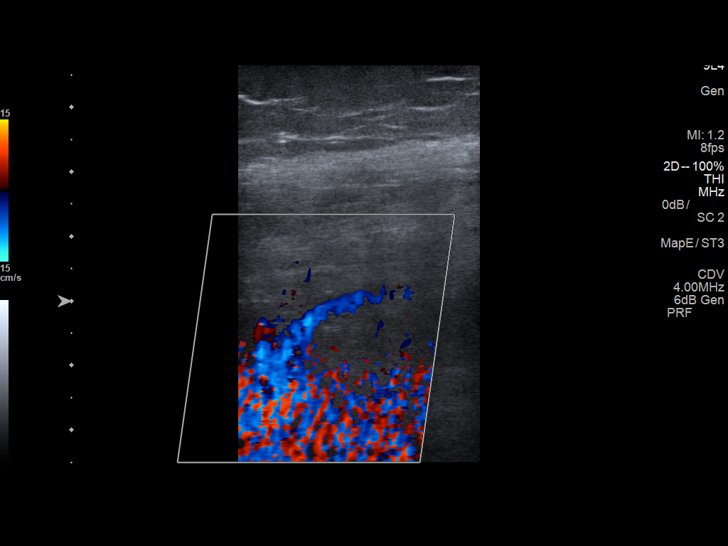

[13 of 24 positions shown; findings below may reference images not displayed]

FINDINGS: Contralateral Common Femoral Vein: Respiratory phasicity is normal
and symmetric with the symptomatic side. No evidence of thrombus.
Normal compressibility.

Common Femoral Vein: No evidence of thrombus. Normal
compressibility, respiratory phasicity and response to augmentation.

Saphenofemoral Junction: No evidence of thrombus. Normal
compressibility and flow on color Doppler imaging.

Profunda Femoral Vein: No evidence of thrombus. Normal
compressibility and flow on color Doppler imaging.

Femoral Vein: No evidence of thrombus. Normal compressibility,
respiratory phasicity and response to augmentation.

Popliteal Vein: No evidence of thrombus. Normal compressibility,
respiratory phasicity and response to augmentation.

Calf Veins: No evidence of thrombus. Normal compressibility and flow
on color Doppler imaging.

Superficial Great Saphenous Vein: No evidence of thrombus. Normal
compressibility.

Venous Reflux:  None.

Other Findings:  None.
IMPRESSION: No evidence of DVT within the left lower extremity.

## 2019-10-13 DIAGNOSIS — M5116 Intervertebral disc disorders with radiculopathy, lumbar region: Secondary | ICD-10-CM | POA: Diagnosis not present

## 2019-10-19 DIAGNOSIS — Z6841 Body Mass Index (BMI) 40.0 and over, adult: Secondary | ICD-10-CM | POA: Diagnosis not present

## 2019-10-19 DIAGNOSIS — Z01419 Encounter for gynecological examination (general) (routine) without abnormal findings: Secondary | ICD-10-CM | POA: Diagnosis not present

## 2019-10-28 DIAGNOSIS — M5116 Intervertebral disc disorders with radiculopathy, lumbar region: Secondary | ICD-10-CM | POA: Diagnosis not present

## 2019-10-28 DIAGNOSIS — M5117 Intervertebral disc disorders with radiculopathy, lumbosacral region: Secondary | ICD-10-CM | POA: Diagnosis not present

## 2019-10-28 DIAGNOSIS — M4807 Spinal stenosis, lumbosacral region: Secondary | ICD-10-CM | POA: Diagnosis not present

## 2019-10-28 DIAGNOSIS — Z9889 Other specified postprocedural states: Secondary | ICD-10-CM | POA: Diagnosis not present

## 2019-10-28 DIAGNOSIS — M5126 Other intervertebral disc displacement, lumbar region: Secondary | ICD-10-CM | POA: Diagnosis not present

## 2019-11-10 DIAGNOSIS — E559 Vitamin D deficiency, unspecified: Secondary | ICD-10-CM | POA: Diagnosis not present

## 2019-11-10 DIAGNOSIS — E039 Hypothyroidism, unspecified: Secondary | ICD-10-CM | POA: Diagnosis not present

## 2019-11-10 DIAGNOSIS — E7800 Pure hypercholesterolemia, unspecified: Secondary | ICD-10-CM | POA: Diagnosis not present

## 2019-11-10 DIAGNOSIS — E1165 Type 2 diabetes mellitus with hyperglycemia: Secondary | ICD-10-CM | POA: Diagnosis not present

## 2019-11-16 DIAGNOSIS — Z20828 Contact with and (suspected) exposure to other viral communicable diseases: Secondary | ICD-10-CM | POA: Diagnosis not present

## 2019-11-22 DIAGNOSIS — M4807 Spinal stenosis, lumbosacral region: Secondary | ICD-10-CM | POA: Diagnosis not present

## 2019-11-22 DIAGNOSIS — M5116 Intervertebral disc disorders with radiculopathy, lumbar region: Secondary | ICD-10-CM | POA: Diagnosis not present

## 2019-11-22 DIAGNOSIS — Z981 Arthrodesis status: Secondary | ICD-10-CM | POA: Diagnosis not present

## 2019-11-22 DIAGNOSIS — M5117 Intervertebral disc disorders with radiculopathy, lumbosacral region: Secondary | ICD-10-CM | POA: Diagnosis not present

## 2019-12-14 DIAGNOSIS — M5116 Intervertebral disc disorders with radiculopathy, lumbar region: Secondary | ICD-10-CM | POA: Diagnosis not present

## 2020-01-11 DIAGNOSIS — Z23 Encounter for immunization: Secondary | ICD-10-CM | POA: Diagnosis not present

## 2020-01-12 DIAGNOSIS — G43009 Migraine without aura, not intractable, without status migrainosus: Secondary | ICD-10-CM | POA: Diagnosis not present

## 2020-01-23 ENCOUNTER — Other Ambulatory Visit: Payer: Self-pay | Admitting: Neurosurgery

## 2020-01-23 DIAGNOSIS — M545 Low back pain, unspecified: Secondary | ICD-10-CM | POA: Diagnosis not present

## 2020-01-23 DIAGNOSIS — M9973 Connective tissue and disc stenosis of intervertebral foramina of lumbar region: Secondary | ICD-10-CM | POA: Diagnosis not present

## 2020-01-23 DIAGNOSIS — M4306 Spondylolysis, lumbar region: Secondary | ICD-10-CM | POA: Diagnosis not present

## 2020-01-24 DIAGNOSIS — M4056 Lordosis, unspecified, lumbar region: Secondary | ICD-10-CM | POA: Diagnosis not present

## 2020-01-24 DIAGNOSIS — M4316 Spondylolisthesis, lumbar region: Secondary | ICD-10-CM | POA: Diagnosis not present

## 2020-01-24 DIAGNOSIS — Z01818 Encounter for other preprocedural examination: Secondary | ICD-10-CM | POA: Diagnosis not present

## 2020-01-24 DIAGNOSIS — T888XXA Other specified complications of surgical and medical care, not elsewhere classified, initial encounter: Secondary | ICD-10-CM | POA: Diagnosis not present

## 2020-01-24 DIAGNOSIS — Y838 Other surgical procedures as the cause of abnormal reaction of the patient, or of later complication, without mention of misadventure at the time of the procedure: Secondary | ICD-10-CM | POA: Diagnosis not present

## 2020-01-24 DIAGNOSIS — Z981 Arthrodesis status: Secondary | ICD-10-CM | POA: Diagnosis not present

## 2020-01-24 DIAGNOSIS — M4326 Fusion of spine, lumbar region: Secondary | ICD-10-CM | POA: Diagnosis not present

## 2020-01-25 ENCOUNTER — Encounter (HOSPITAL_COMMUNITY): Payer: Self-pay | Admitting: Neurosurgery

## 2020-01-25 ENCOUNTER — Other Ambulatory Visit: Payer: Self-pay | Admitting: Neurosurgery

## 2020-01-25 ENCOUNTER — Other Ambulatory Visit: Payer: Self-pay

## 2020-01-25 MED ORDER — VANCOMYCIN HCL 1500 MG/300ML IV SOLN
1500.0000 mg | INTRAVENOUS | Status: AC
  Administered 2020-01-26: 1500 mg via INTRAVENOUS
  Filled 2020-01-25: qty 300

## 2020-01-25 NOTE — Progress Notes (Signed)
Anesthesia Chart Review: SAME DAY WORK-UP   Case: [id number] Date/Time: 01/26/20 1545   Procedure: WOUND EXPLORATION WITH POSS HARDWARE REVISION L5S1 (N/A )   Anesthesia type: General   Pre-op diagnosis: POST OPERATIVE FLUID COLLECTION   Location: [facility] OR ROOM 21 / [facility]   Surgeons: Consuella Lose, MD      DISCUSSION: Patient is a 44 year old female scheduled for the above procedure.  History includes never smoker, post-operative N/V, hypercholesterolemia, fatty liver disease, PCOS, easy bruising, hypothyroidism, GERD, migraines, morbid obesity, back surgery (left L5 laminotomy/microdiscectomy for cauda equina syndrome 10/22/17; L4 Gill Procedure, L4-5 fusion 12/18/17; L5-S1 fusion 11/1419).   Address is listed as [place], [place]. If COVID-19 is not done then that will need to be addressed on the day of surgery. She is a same day work-up.  VS: Ht 5\' 4"  (1.626 m)   Wt 129 kg   LMP  (LMP Unknown)   BMI 48.82 kg/m  For day of surgery.   PROVIDERS: Rosalee Kaufman, PA-C is PCP    LABS: For labs day of surgery.    IMAGES: CT L-spine 01/24/20 [facility]): Impression: 1. New L5-S1 fusion and decompression from the August CT. Interbody  implants, that on the left is more posteriorly positioned and  encroaches on the left L5 neural foramina (sagittal image 46).  2. Chronic decompression and fusion at L4-L5 with removed L4 pedicle  screws since August. Evidence of solid posterior interbody  arthrodesis.  3. Other lumbar levels are stable. No acute osseous abnormality.    MRI L-spine 01/23/20 [facility]): Impression: - Since the prior CT of 10/28/2019 and lumbar spine MRI of 07/06/2018,  there has been interval posterior decompression, presumed discectomy  and posterior spinal fusion at L5-S1. At L5-S1, there is prominent  signal hypointensity within the ventral aspectof the spinal canal  concerning for possible posterior displacement of the interbody  spacer. CT of the lumbar spine  is recommended for further  evaluation. Also at this level, there is a dorsal fluid collection  with mass effect upon the thecal sac. Suspected multifactorial  severe spinal canal stenosis and mild left neural foraminal  narrowing at this level.  - At L4-L5, redemonstrated sequela of prior posterior decompression  and fusion. No significant canal stenosis at the disc space level.  However, a dorsal fluid collection partially effaces the thecal sac  at the L5 vertebral body level with moderate canal narrowing.  - No significant disc herniation, spinal canal stenosis or neural  foraminal narrowing at the remaining lumbar levels.     EKG: N/A   CV: N/A  Past Medical History:  Diagnosis Date  . Arthritis    back  . Depression   . Easy bruising   . Fatty liver   . GERD (gastroesophageal reflux disease)   . Hypercholesterolemia    diet controlled, no med  . Hypothyroidism   . Migraines   . Polycystic ovarian disease   . PONV (postoperative nausea and vomiting)     Past Surgical History:  Procedure Laterality Date  . ABLATION     cervical ablation at MD's Office  . BACK SURGERY  12/2017, 11/22/19  . CARPAL TUNNEL RELEASE Bilateral   . CHOLECYSTECTOMY  04/2007   Dr. Lindalou Hose  . DILATION AND CURETTAGE OF UTERUS  12/2011  . EYE SURGERY Bilateral    Lasik  . LUMBAR LAMINECTOMY/DECOMPRESSION MICRODISCECTOMY Left 10/22/2017   Procedure: LUMBAR - FIVE LAMINOTOMY AND MICRODISCECTOMY;  Surgeon: Consuella Lose, MD;  Location: [place] [facility];  Service: Neurosurgery;  Laterality: Left;   LUMBAR - FIVE LAMINOTOMY AND MICRODISCECTOMY    MEDICATIONS: No current facility-administered medications for this encounter.   . citalopram (CELEXA) 40 MG tablet  . Erenumab-aooe (AIMOVIG) 140 MG/ML SOAJ  . ibuprofen (ADVIL) 800 MG tablet  . levothyroxine (SYNTHROID) 200 MCG tablet  . levothyroxine (SYNTHROID) 50 MCG tablet  . meloxicam (MOBIC) 15 MG tablet  . pantoprazole (PROTONIX) 40 MG tablet   . rizatriptan (MAXALT) 10 MG tablet  . rOPINIRole (REQUIP) 0.25 MG tablet  . traMADol (ULTRAM) 50 MG tablet  . pregabalin (LYRICA) 75 MG capsule  . RYBELSUS 7 MG TABS     Myra Gianotti, PA-C Surgical Short Stay/Anesthesiology [facility] Phone [phone] [facility] Phone [phone] 01/25/2020 11:19 AM

## 2020-01-25 NOTE — Progress Notes (Signed)
PCP - Clemmie Krill, PA-C Cardiologist - n/a Neurology - Dr Sarina Ill  Chest x-ray - n/a EKG - n/a Stress Test - n/a ECHO - n/a Cardiac Cath - n/a  Anesthesia review: Yes  STOP now taking any Aspirin (unless otherwise instructed by your surgeon), Aleve, Naproxen, Ibuprofen, Motrin, Advil, Goody's, BC's, all herbal medications, fish oil, and all vitamins.   Patient is not a diabetic.  Patient takes rybelsus for weight loss.  Coronavirus Screening Covid test is scheduled on DOS Do you have any of the following symptoms:  Cough yes/no: No Fever (>100.96F)  yes/no: No Runny nose yes/no: No Sore throat yes/no: No Difficulty breathing/shortness of breath  yes/no: No  Have you traveled in the last 14 days and where? yes/no: No  Patient verbalized understanding of instructions that were given via phone.

## 2020-01-25 NOTE — Anesthesia Preprocedure Evaluation (Addendum)
Anesthesia Evaluation  Patient identified by MRN, date of birth, ID band Patient awake    Reviewed: Allergy & Precautions, NPO status , Patient's Chart, lab work & pertinent test results  Airway Mallampati: II  TM Distance: >3 FB Neck ROM: Full    Dental  (+) Teeth Intact, Dental Advisory Given   Pulmonary    breath sounds clear to auscultation       Cardiovascular  Rhythm:Regular Rate:Normal     Neuro/Psych    GI/Hepatic   Endo/Other    Renal/GU      Musculoskeletal   Abdominal   Peds  Hematology   Anesthesia Other Findings   Reproductive/Obstetrics                            Anesthesia Physical Anesthesia Plan  ASA: III  Anesthesia Plan:    Post-op Pain Management:    Induction:   PONV Risk Score and Plan: Ondansetron and Dexamethasone  Airway Management Planned: Oral ETT  Additional Equipment:   Intra-op Plan:   Post-operative Plan: Extubation in OR  Informed Consent: I have reviewed the patients History and Physical, chart, labs and discussed the procedure including the risks, benefits and alternatives for the proposed anesthesia with the patient or authorized representative who has indicated his/her understanding and acceptance.     Dental advisory given  Plan Discussed with: CRNA and Anesthesiologist  Anesthesia Plan Comments: (PAT note written 01/25/2020 by Myra Gianotti, PA-C. SAME DAY WORK-UP   )       Anesthesia Quick Evaluation

## 2020-01-26 ENCOUNTER — Ambulatory Visit (HOSPITAL_COMMUNITY): Payer: BC Managed Care – PPO

## 2020-01-26 ENCOUNTER — Ambulatory Visit (HOSPITAL_COMMUNITY): Payer: BC Managed Care – PPO | Admitting: Vascular Surgery

## 2020-01-26 ENCOUNTER — Encounter (HOSPITAL_COMMUNITY): Payer: Self-pay | Admitting: Neurosurgery

## 2020-01-26 ENCOUNTER — Inpatient Hospital Stay (HOSPITAL_COMMUNITY)
Admission: RE | Admit: 2020-01-26 | Discharge: 2020-01-27 | DRG: 314 | Disposition: A | Discharge status: Home / Self Care | Payer: BC Managed Care – PPO | Attending: Internal Medicine | Admitting: Internal Medicine

## 2020-01-26 ENCOUNTER — Encounter (HOSPITAL_COMMUNITY)
Admission: RE | Disposition: A | Discharge status: Home / Self Care | Payer: Self-pay | Source: Home / Self Care | Attending: Internal Medicine

## 2020-01-26 DIAGNOSIS — E872 Acidosis: Secondary | ICD-10-CM | POA: Diagnosis present

## 2020-01-26 DIAGNOSIS — Z978 Presence of other specified devices: Secondary | ICD-10-CM

## 2020-01-26 DIAGNOSIS — Z6841 Body Mass Index (BMI) 40.0 and over, adult: Secondary | ICD-10-CM | POA: Diagnosis not present

## 2020-01-26 DIAGNOSIS — R0902 Hypoxemia: Secondary | ICD-10-CM

## 2020-01-26 DIAGNOSIS — Z83438 Family history of other disorder of lipoprotein metabolism and other lipidemia: Secondary | ICD-10-CM

## 2020-01-26 DIAGNOSIS — I469 Cardiac arrest, cause unspecified: Secondary | ICD-10-CM | POA: Diagnosis not present

## 2020-01-26 DIAGNOSIS — T8140XA Infection following a procedure, unspecified, initial encounter: Secondary | ICD-10-CM

## 2020-01-26 DIAGNOSIS — M4807 Spinal stenosis, lumbosacral region: Secondary | ICD-10-CM | POA: Diagnosis not present

## 2020-01-26 DIAGNOSIS — Z881 Allergy status to other antibiotic agents status: Secondary | ICD-10-CM | POA: Diagnosis not present

## 2020-01-26 DIAGNOSIS — I10 Essential (primary) hypertension: Secondary | ICD-10-CM | POA: Diagnosis present

## 2020-01-26 DIAGNOSIS — T8131XA Disruption of external operation (surgical) wound, not elsewhere classified, initial encounter: Secondary | ICD-10-CM | POA: Diagnosis not present

## 2020-01-26 DIAGNOSIS — Z79890 Hormone replacement therapy (postmenopausal): Secondary | ICD-10-CM | POA: Diagnosis not present

## 2020-01-26 DIAGNOSIS — I248 Other forms of acute ischemic heart disease: Secondary | ICD-10-CM | POA: Diagnosis not present

## 2020-01-26 DIAGNOSIS — E7800 Pure hypercholesterolemia, unspecified: Secondary | ICD-10-CM | POA: Diagnosis present

## 2020-01-26 DIAGNOSIS — Y92234 Operating room of hospital as the place of occurrence of the external cause: Secondary | ICD-10-CM | POA: Diagnosis present

## 2020-01-26 DIAGNOSIS — E1165 Type 2 diabetes mellitus with hyperglycemia: Secondary | ICD-10-CM | POA: Diagnosis present

## 2020-01-26 DIAGNOSIS — K219 Gastro-esophageal reflux disease without esophagitis: Secondary | ICD-10-CM | POA: Diagnosis present

## 2020-01-26 DIAGNOSIS — I97711 Intraoperative cardiac arrest during other surgery: Secondary | ICD-10-CM | POA: Diagnosis not present

## 2020-01-26 DIAGNOSIS — Z9049 Acquired absence of other specified parts of digestive tract: Secondary | ICD-10-CM

## 2020-01-26 DIAGNOSIS — Z538 Procedure and treatment not carried out for other reasons: Secondary | ICD-10-CM | POA: Diagnosis present

## 2020-01-26 DIAGNOSIS — Z79899 Other long term (current) drug therapy: Secondary | ICD-10-CM | POA: Diagnosis not present

## 2020-01-26 DIAGNOSIS — R579 Shock, unspecified: Secondary | ICD-10-CM | POA: Diagnosis present

## 2020-01-26 DIAGNOSIS — J9811 Atelectasis: Secondary | ICD-10-CM | POA: Diagnosis not present

## 2020-01-26 DIAGNOSIS — Z833 Family history of diabetes mellitus: Secondary | ICD-10-CM

## 2020-01-26 DIAGNOSIS — E282 Polycystic ovarian syndrome: Secondary | ICD-10-CM | POA: Diagnosis present

## 2020-01-26 DIAGNOSIS — J9801 Acute bronchospasm: Secondary | ICD-10-CM | POA: Diagnosis not present

## 2020-01-26 DIAGNOSIS — R0603 Acute respiratory distress: Secondary | ICD-10-CM

## 2020-01-26 DIAGNOSIS — R000 Tachycardia, unspecified: Secondary | ICD-10-CM | POA: Diagnosis present

## 2020-01-26 DIAGNOSIS — J9620 Acute and chronic respiratory failure, unspecified whether with hypoxia or hypercapnia: Secondary | ICD-10-CM | POA: Diagnosis not present

## 2020-01-26 DIAGNOSIS — Y838 Other surgical procedures as the cause of abnormal reaction of the patient, or of later complication, without mention of misadventure at the time of the procedure: Secondary | ICD-10-CM | POA: Diagnosis present

## 2020-01-26 DIAGNOSIS — Z8249 Family history of ischemic heart disease and other diseases of the circulatory system: Secondary | ICD-10-CM

## 2020-01-26 DIAGNOSIS — J9691 Respiratory failure, unspecified with hypoxia: Secondary | ICD-10-CM | POA: Diagnosis not present

## 2020-01-26 DIAGNOSIS — K760 Fatty (change of) liver, not elsewhere classified: Secondary | ICD-10-CM | POA: Diagnosis present

## 2020-01-26 DIAGNOSIS — F32A Depression, unspecified: Secondary | ICD-10-CM | POA: Diagnosis present

## 2020-01-26 DIAGNOSIS — G2581 Restless legs syndrome: Secondary | ICD-10-CM | POA: Diagnosis present

## 2020-01-26 DIAGNOSIS — M5117 Intervertebral disc disorders with radiculopathy, lumbosacral region: Secondary | ICD-10-CM | POA: Diagnosis not present

## 2020-01-26 DIAGNOSIS — J95821 Acute postprocedural respiratory failure: Secondary | ICD-10-CM | POA: Diagnosis not present

## 2020-01-26 DIAGNOSIS — G8929 Other chronic pain: Secondary | ICD-10-CM | POA: Diagnosis not present

## 2020-01-26 DIAGNOSIS — Z981 Arthrodesis status: Secondary | ICD-10-CM | POA: Diagnosis not present

## 2020-01-26 DIAGNOSIS — Z20822 Contact with and (suspected) exposure to covid-19: Secondary | ICD-10-CM | POA: Diagnosis present

## 2020-01-26 DIAGNOSIS — Z791 Long term (current) use of non-steroidal anti-inflammatories (NSAID): Secondary | ICD-10-CM

## 2020-01-26 DIAGNOSIS — Z888 Allergy status to other drugs, medicaments and biological substances status: Secondary | ICD-10-CM

## 2020-01-26 DIAGNOSIS — L7634 Postprocedural seroma of skin and subcutaneous tissue following other procedure: Secondary | ICD-10-CM | POA: Diagnosis not present

## 2020-01-26 DIAGNOSIS — G629 Polyneuropathy, unspecified: Secondary | ICD-10-CM | POA: Diagnosis present

## 2020-01-26 DIAGNOSIS — G43909 Migraine, unspecified, not intractable, without status migrainosus: Secondary | ICD-10-CM | POA: Diagnosis present

## 2020-01-26 DIAGNOSIS — M47896 Other spondylosis, lumbar region: Secondary | ICD-10-CM | POA: Diagnosis not present

## 2020-01-26 DIAGNOSIS — E876 Hypokalemia: Secondary | ICD-10-CM | POA: Diagnosis not present

## 2020-01-26 DIAGNOSIS — E785 Hyperlipidemia, unspecified: Secondary | ICD-10-CM | POA: Diagnosis present

## 2020-01-26 DIAGNOSIS — E039 Hypothyroidism, unspecified: Secondary | ICD-10-CM | POA: Diagnosis present

## 2020-01-26 DIAGNOSIS — J9601 Acute respiratory failure with hypoxia: Secondary | ICD-10-CM

## 2020-01-26 HISTORY — DX: Migraine, unspecified, not intractable, without status migrainosus: G43.909

## 2020-01-26 HISTORY — DX: Hypothyroidism, unspecified: E03.9

## 2020-01-26 HISTORY — DX: Unspecified osteoarthritis, unspecified site: M19.90

## 2020-01-26 HISTORY — DX: Gastro-esophageal reflux disease without esophagitis: K21.9

## 2020-01-26 LAB — COMPREHENSIVE METABOLIC PANEL
ALT: 29 U/L (ref 0–44)
AST: 33 U/L (ref 15–41)
Albumin: 4 g/dL (ref 3.5–5.0)
Alkaline Phosphatase: 61 U/L (ref 38–126)
Anion gap: 12 (ref 5–15)
BUN: 8 mg/dL (ref 6–20)
CO2: 24 mmol/L (ref 22–32)
Calcium: 9 mg/dL (ref 8.9–10.3)
Chloride: 104 mmol/L (ref 98–111)
Creatinine, Ser: 0.78 mg/dL (ref 0.44–1.00)
GFR, Estimated: >60 mL/min (ref >60)
Glucose, Bld: 115 mg/dL — ABNORMAL HIGH (ref 70–99)
Potassium: 3.8 mmol/L (ref 3.5–5.1)
Sodium: 140 mmol/L (ref 135–145)
Total Bilirubin: 1 mg/dL (ref 0.3–1.2)
Total Protein: 7.1 g/dL (ref 6.5–8.1)

## 2020-01-26 LAB — BASIC METABOLIC PANEL
Anion gap: 14 (ref 5–15)
BUN: 10 mg/dL (ref 6–20)
CO2: 20 mmol/L — ABNORMAL LOW (ref 22–32)
Calcium: 8.5 mg/dL — ABNORMAL LOW (ref 8.9–10.3)
Chloride: 104 mmol/L (ref 98–111)
Creatinine, Ser: 0.83 mg/dL (ref 0.44–1.00)
GFR, Estimated: >60 mL/min (ref >60)
Glucose, Bld: 149 mg/dL — ABNORMAL HIGH (ref 70–99)
Potassium: 3.2 mmol/L — ABNORMAL LOW (ref 3.5–5.1)
Sodium: 138 mmol/L (ref 135–145)

## 2020-01-26 LAB — CBC
HCT: 41.5 % (ref 36.0–46.0)
HCT: 39.7 % (ref 36.0–46.0)
Hemoglobin: 13.5 g/dL (ref 12.0–15.0)
Hemoglobin: 13 g/dL (ref 12.0–15.0)
MCH: 30.1 pg (ref 26.0–34.0)
MCH: 29.7 pg (ref 26.0–34.0)
MCHC: 32.5 g/dL (ref 30.0–36.0)
MCHC: 32.7 g/dL (ref 30.0–36.0)
MCV: 92.4 fL (ref 80.0–100.0)
MCV: 90.8 fL (ref 80.0–100.0)
Platelets: 239 10*3/uL (ref 150–400)
Platelets: 231 10*3/uL (ref 150–400)
RBC: 4.49 MIL/uL (ref 3.87–5.11)
RBC: 4.37 MIL/uL (ref 3.87–5.11)
RDW: 14 % (ref 11.5–15.5)
RDW: 13.9 % (ref 11.5–15.5)
WBC: 6.7 10*3/uL (ref 4.0–10.5)
WBC: 9 10*3/uL (ref 4.0–10.5)
nRBC: 0 % (ref 0.0–0.2)
nRBC: 0 % (ref 0.0–0.2)

## 2020-01-26 LAB — POCT I-STAT 7, (LYTES, BLD GAS, ICA,H+H)
Acid-base deficit: 5 mmol/L — ABNORMAL HIGH (ref 0.0–2.0)
Acid-base deficit: 1 mmol/L (ref 0.0–2.0)
Bicarbonate: 19.1 mmol/L — ABNORMAL LOW (ref 20.0–28.0)
Bicarbonate: 24.4 mmol/L (ref 20.0–28.0)
Calcium, Ion: 1.01 mmol/L — ABNORMAL LOW (ref 1.15–1.40)
Calcium, Ion: 1.16 mmol/L (ref 1.15–1.40)
HCT: 31 % — ABNORMAL LOW (ref 36.0–46.0)
HCT: 37 % (ref 36.0–46.0)
Hemoglobin: 10.5 g/dL — ABNORMAL LOW (ref 12.0–15.0)
Hemoglobin: 12.6 g/dL (ref 12.0–15.0)
O2 Saturation: 100 %
O2 Saturation: 100 %
Patient temperature: 97
Potassium: 2.4 mmol/L — CL (ref 3.5–5.1)
Potassium: 3.1 mmol/L — ABNORMAL LOW (ref 3.5–5.1)
Sodium: 142 mmol/L (ref 135–145)
Sodium: 137 mmol/L (ref 135–145)
TCO2: 20 mmol/L — ABNORMAL LOW (ref 22–32)
TCO2: 26 mmol/L (ref 22–32)
pCO2 arterial: 33 mmHg (ref 32.0–48.0)
pCO2 arterial: 41.1 mmHg (ref 32.0–48.0)
pH, Arterial: 7.371 (ref 7.350–7.450)
pH, Arterial: 7.377 (ref 7.350–7.450)
pO2, Arterial: 260 mmHg — ABNORMAL HIGH (ref 83.0–108.0)
pO2, Arterial: 228 mmHg — ABNORMAL HIGH (ref 83.0–108.0)

## 2020-01-26 LAB — D-DIMER, QUANTITATIVE: D-Dimer, Quant: 1.25 ug/mL-FEU — ABNORMAL HIGH (ref 0.00–0.50)

## 2020-01-26 LAB — TROPONIN I (HIGH SENSITIVITY)
Troponin I (High Sensitivity): 10 ng/L (ref <18)
Troponin I (High Sensitivity): 35 ng/L — ABNORMAL HIGH (ref <18)
Troponin I (High Sensitivity): 38 ng/L — ABNORMAL HIGH (ref <18)

## 2020-01-26 LAB — GLUCOSE, CAPILLARY: Glucose-Capillary: 152 mg/dL — ABNORMAL HIGH (ref 70–99)

## 2020-01-26 LAB — SEDIMENTATION RATE: Sed Rate: 5 mm/hr (ref 0–22)

## 2020-01-26 LAB — LACTIC ACID, PLASMA: Lactic Acid, Venous: 2.1 mmol/L (ref 0.5–1.9)

## 2020-01-26 LAB — C-REACTIVE PROTEIN: CRP: 0.6 mg/dL (ref <1.0)

## 2020-01-26 LAB — HEMOGLOBIN A1C
Hgb A1c MFr Bld: 5.7 % — ABNORMAL HIGH (ref 4.8–5.6)
Mean Plasma Glucose: 116.89 mg/dL

## 2020-01-26 LAB — SARS CORONAVIRUS 2 BY RT PCR (HOSPITAL ORDER, PERFORMED IN ~~LOC~~ HOSPITAL LAB): SARS Coronavirus 2: NEGATIVE

## 2020-01-26 LAB — PROCALCITONIN: Procalcitonin: <0.1 ng/mL

## 2020-01-26 LAB — SURGICAL PCR SCREEN
MRSA, PCR: NEGATIVE
Staphylococcus aureus: NEGATIVE

## 2020-01-26 LAB — MAGNESIUM: Magnesium: 2.1 mg/dL (ref 1.7–2.4)

## 2020-01-26 LAB — TSH: TSH: 2.313 u[IU]/mL (ref 0.350–4.500)

## 2020-01-26 LAB — POCT PREGNANCY, URINE: Preg Test, Ur: NEGATIVE

## 2020-01-26 LAB — HCG, QUANTITATIVE, PREGNANCY: hCG, Beta Chain, Quant, S: <1 m[IU]/mL (ref <5)

## 2020-01-26 SURGERY — POSTERIOR LUMBAR FUSION 1 LEVEL
Anesthesia: General

## 2020-01-26 MED ORDER — ROCURONIUM BROMIDE 10 MG/ML (PF) SYRINGE
PREFILLED_SYRINGE | INTRAVENOUS | Status: DC | PRN
  Administered 2020-01-26: 80 mg via INTRAVENOUS

## 2020-01-26 MED ORDER — LIDOCAINE-EPINEPHRINE 1 %-1:100000 IJ SOLN
INTRAMUSCULAR | Status: AC
  Filled 2020-01-26: qty 1

## 2020-01-26 MED ORDER — ALBUTEROL SULFATE HFA 108 (90 BASE) MCG/ACT IN AERS
INHALATION_SPRAY | RESPIRATORY_TRACT | Status: DC | PRN
  Administered 2020-01-26: 2 via RESPIRATORY_TRACT

## 2020-01-26 MED ORDER — POTASSIUM CHLORIDE CRYS ER 20 MEQ PO TBCR
40.0000 meq | EXTENDED_RELEASE_TABLET | Freq: Once | ORAL | Status: AC
  Administered 2020-01-26: 40 meq via ORAL
  Filled 2020-01-26: qty 2

## 2020-01-26 MED ORDER — ONDANSETRON HCL 4 MG/2ML IJ SOLN
INTRAMUSCULAR | Status: DC | PRN
  Administered 2020-01-26: 4 mg via INTRAVENOUS

## 2020-01-26 MED ORDER — MAGNESIUM SULFATE 2 GM/50ML IV SOLN
2.0000 g | Freq: Once | INTRAVENOUS | Status: AC
  Administered 2020-01-26: 2 g via INTRAVENOUS
  Filled 2020-01-26: qty 50

## 2020-01-26 MED ORDER — INSULIN ASPART 100 UNIT/ML ~~LOC~~ SOLN: 0.0000 [IU] | Freq: Every day | SUBCUTANEOUS | Status: DC

## 2020-01-26 MED ORDER — BUPIVACAINE HCL (PF) 0.5 % IJ SOLN
INTRAMUSCULAR | Status: AC
  Filled 2020-01-26: qty 30

## 2020-01-26 MED ORDER — ACETAMINOPHEN 325 MG PO TABS: 650.0000 mg | ORAL_TABLET | Freq: Four times a day (QID) | ORAL | Status: DC | PRN

## 2020-01-26 MED ORDER — ROPINIROLE HCL 0.25 MG PO TABS: 0.2500 mg | ORAL_TABLET | Freq: Every evening | ORAL | Status: DC | PRN

## 2020-01-26 MED ORDER — CHLORHEXIDINE GLUCONATE CLOTH 2 % EX PADS: 6.0000 | MEDICATED_PAD | Freq: Once | CUTANEOUS | Status: DC

## 2020-01-26 MED ORDER — EPHEDRINE SULFATE-NACL 50-0.9 MG/10ML-% IV SOSY
PREFILLED_SYRINGE | INTRAVENOUS | Status: DC | PRN
  Administered 2020-01-26: 20 mg via INTRAVENOUS
  Administered 2020-01-26: 10 mg via INTRAVENOUS

## 2020-01-26 MED ORDER — CHLORHEXIDINE GLUCONATE 0.12 % MT SOLN
OROMUCOSAL | Status: AC
  Administered 2020-01-26: 15 mL via OROMUCOSAL
  Filled 2020-01-26: qty 15

## 2020-01-26 MED ORDER — FENTANYL CITRATE (PF) 250 MCG/5ML IJ SOLN
INTRAMUSCULAR | Status: AC
  Filled 2020-01-26: qty 5

## 2020-01-26 MED ORDER — MIDAZOLAM HCL 2 MG/2ML IJ SOLN
INTRAMUSCULAR | Status: AC
  Filled 2020-01-26: qty 2

## 2020-01-26 MED ORDER — ROCURONIUM BROMIDE 10 MG/ML (PF) SYRINGE
PREFILLED_SYRINGE | INTRAVENOUS | Status: AC
  Filled 2020-01-26: qty 10

## 2020-01-26 MED ORDER — OXYCODONE HCL 5 MG PO TABS
5.0000 mg | ORAL_TABLET | Freq: Four times a day (QID) | ORAL | Status: DC | PRN
  Administered 2020-01-26 – 2020-01-27 (×3): 5 mg via ORAL
  Filled 2020-01-26 (×3): qty 1

## 2020-01-26 MED ORDER — ENOXAPARIN SODIUM 60 MG/0.6ML ~~LOC~~ SOLN
60.0000 mg | SUBCUTANEOUS | Status: DC
  Administered 2020-01-27: 60 mg via SUBCUTANEOUS
  Filled 2020-01-26 (×2): qty 0.6

## 2020-01-26 MED ORDER — THROMBIN 5000 UNITS EX SOLR
CUTANEOUS | Status: AC
  Filled 2020-01-26: qty 5000

## 2020-01-26 MED ORDER — LIDOCAINE 2% (20 MG/ML) 5 ML SYRINGE
INTRAMUSCULAR | Status: AC
  Filled 2020-01-26: qty 5

## 2020-01-26 MED ORDER — PROPOFOL 500 MG/50ML IV EMUL
INTRAVENOUS | Status: DC | PRN
  Administered 2020-01-26: 130 ug/kg/min via INTRAVENOUS

## 2020-01-26 MED ORDER — DEXAMETHASONE SODIUM PHOSPHATE 10 MG/ML IJ SOLN
INTRAMUSCULAR | Status: AC
  Filled 2020-01-26: qty 1

## 2020-01-26 MED ORDER — LEVOTHYROXINE SODIUM 200 MCG PO TABS: 200.0000 ug | ORAL_TABLET | Freq: Every day | ORAL | Status: DC

## 2020-01-26 MED ORDER — ESMOLOL HCL 100 MG/10ML IV SOLN
INTRAVENOUS | Status: AC
  Filled 2020-01-26: qty 10

## 2020-01-26 MED ORDER — ENOXAPARIN SODIUM 40 MG/0.4ML ~~LOC~~ SOLN: 40.0000 mg | SUBCUTANEOUS | Status: DC

## 2020-01-26 MED ORDER — INSULIN ASPART 100 UNIT/ML ~~LOC~~ SOLN: 0.0000 [IU] | Freq: Three times a day (TID) | SUBCUTANEOUS | Status: DC

## 2020-01-26 MED ORDER — DEXAMETHASONE SODIUM PHOSPHATE 10 MG/ML IJ SOLN
INTRAMUSCULAR | Status: DC | PRN
  Administered 2020-01-26: 10 mg via INTRAVENOUS

## 2020-01-26 MED ORDER — PHENYLEPHRINE HCL-NACL 10-0.9 MG/250ML-% IV SOLN
INTRAVENOUS | Status: DC | PRN
  Administered 2020-01-26: 30 ug/min via INTRAVENOUS

## 2020-01-26 MED ORDER — CITALOPRAM HYDROBROMIDE 40 MG PO TABS
40.0000 mg | ORAL_TABLET | Freq: Every day | ORAL | Status: DC
  Administered 2020-01-26 – 2020-01-27 (×2): 40 mg via ORAL
  Filled 2020-01-26 (×2): qty 1
  Filled 2020-01-26 (×2): qty 2

## 2020-01-26 MED ORDER — SODIUM CHLORIDE 0.9 % IV BOLUS
500.0000 mL | Freq: Once | INTRAVENOUS | Status: AC
  Administered 2020-01-26: 500 mL via INTRAVENOUS

## 2020-01-26 MED ORDER — FENTANYL CITRATE (PF) 100 MCG/2ML IJ SOLN
INTRAMUSCULAR | Status: DC | PRN
  Administered 2020-01-26: 100 ug via INTRAVENOUS

## 2020-01-26 MED ORDER — ERENUMAB-AOOE 140 MG/ML ~~LOC~~ SOAJ: 140.0000 mg | SUBCUTANEOUS | Status: DC

## 2020-01-26 MED ORDER — PHENYLEPHRINE 40 MCG/ML (10ML) SYRINGE FOR IV PUSH (FOR BLOOD PRESSURE SUPPORT)
PREFILLED_SYRINGE | INTRAVENOUS | Status: AC
  Filled 2020-01-26: qty 20

## 2020-01-26 MED ORDER — LACTATED RINGERS IV SOLN: INTRAVENOUS | Status: DC

## 2020-01-26 MED ORDER — PANTOPRAZOLE SODIUM 40 MG PO TBEC: 40.0000 mg | DELAYED_RELEASE_TABLET | Freq: Every day | ORAL | Status: DC | PRN

## 2020-01-26 MED ORDER — ONDANSETRON HCL 4 MG/2ML IJ SOLN
INTRAMUSCULAR | Status: AC
  Filled 2020-01-26: qty 2

## 2020-01-26 MED ORDER — SUCCINYLCHOLINE CHLORIDE 200 MG/10ML IV SOSY
PREFILLED_SYRINGE | INTRAVENOUS | Status: DC | PRN
  Administered 2020-01-26: 100 mg via INTRAVENOUS

## 2020-01-26 MED ORDER — PHENYLEPHRINE 40 MCG/ML (10ML) SYRINGE FOR IV PUSH (FOR BLOOD PRESSURE SUPPORT)
PREFILLED_SYRINGE | INTRAVENOUS | Status: DC | PRN
  Administered 2020-01-26 (×2): 80 ug via INTRAVENOUS
  Administered 2020-01-26 (×2): 120 ug via INTRAVENOUS

## 2020-01-26 MED ORDER — POTASSIUM CHLORIDE IN NACL 40-0.9 MEQ/L-% IV SOLN
INTRAVENOUS | Status: DC
  Filled 2020-01-26 (×2): qty 1000

## 2020-01-26 MED ORDER — PROPOFOL 10 MG/ML IV BOLUS
INTRAVENOUS | Status: DC | PRN
  Administered 2020-01-26: 160 mg via INTRAVENOUS

## 2020-01-26 MED ORDER — SUCCINYLCHOLINE CHLORIDE 200 MG/10ML IV SOSY
PREFILLED_SYRINGE | INTRAVENOUS | Status: AC
  Filled 2020-01-26: qty 10

## 2020-01-26 MED ORDER — CHLORHEXIDINE GLUCONATE 0.12 % MT SOLN: 15.0000 mL | Freq: Once | OROMUCOSAL | Status: AC

## 2020-01-26 MED ORDER — FENTANYL CITRATE (PF) 100 MCG/2ML IJ SOLN: 25.0000 ug | INTRAMUSCULAR | Status: DC | PRN

## 2020-01-26 MED ORDER — EPINEPHRINE 1 MG/10ML IJ SOSY
PREFILLED_SYRINGE | INTRAMUSCULAR | Status: DC | PRN
  Administered 2020-01-26: .05 mg via INTRAVENOUS

## 2020-01-26 MED ORDER — SENNOSIDES-DOCUSATE SODIUM 8.6-50 MG PO TABS
2.0000 | ORAL_TABLET | Freq: Two times a day (BID) | ORAL | Status: DC
  Administered 2020-01-26 – 2020-01-27 (×2): 2 via ORAL
  Filled 2020-01-26 (×2): qty 2

## 2020-01-26 MED ORDER — EPINEPHRINE 1 MG/10ML IJ SOSY
PREFILLED_SYRINGE | INTRAMUSCULAR | Status: AC
  Filled 2020-01-26: qty 10

## 2020-01-26 MED ORDER — ORAL CARE MOUTH RINSE: 15.0000 mL | Freq: Once | OROMUCOSAL | Status: AC

## 2020-01-26 MED ORDER — ONDANSETRON HCL 4 MG/2ML IJ SOLN: 4.0000 mg | Freq: Once | INTRAMUSCULAR | Status: DC | PRN

## 2020-01-26 MED ORDER — LIDOCAINE 2% (20 MG/ML) 5 ML SYRINGE
INTRAMUSCULAR | Status: DC | PRN
  Administered 2020-01-26: 100 mg via INTRAVENOUS

## 2020-01-26 MED ORDER — MORPHINE SULFATE (PF) 2 MG/ML IV SOLN
2.0000 mg | INTRAVENOUS | Status: DC | PRN
  Administered 2020-01-26: 2 mg via INTRAVENOUS
  Filled 2020-01-26: qty 1

## 2020-01-26 MED ORDER — EPHEDRINE 5 MG/ML INJ
INTRAVENOUS | Status: AC
  Filled 2020-01-26: qty 10

## 2020-01-26 SURGICAL SUPPLY — 58 items
BASKET BONE COLLECTION (BASKET) IMPLANT
BENZOIN TINCTURE PRP APPL 2/3 (GAUZE/BANDAGES/DRESSINGS) IMPLANT
BLADE CLIPPER SURG (BLADE) IMPLANT
BLADE SURG 11 STRL SS (BLADE) IMPLANT
BUR MATCHSTICK NEURO 3.0 LAGG (BURR) IMPLANT
BUR PRECISION FLUTE 5.0 (BURR) IMPLANT
CANISTER SUCT 3000ML PPV (MISCELLANEOUS) IMPLANT
CARTRIDGE OIL MAESTRO DRILL (MISCELLANEOUS) IMPLANT
CNTNR URN SCR LID CUP LEK RST (MISCELLANEOUS) IMPLANT
CONT SPEC 4OZ STRL OR WHT (MISCELLANEOUS)
COVER BACK TABLE 60X90IN (DRAPES) IMPLANT
COVER WAND RF STERILE (DRAPES) IMPLANT
DECANTER SPIKE VIAL GLASS SM (MISCELLANEOUS) IMPLANT
DERMABOND ADVANCED (GAUZE/BANDAGES/DRESSINGS)
DERMABOND ADVANCED .7 DNX12 (GAUZE/BANDAGES/DRESSINGS) IMPLANT
DIFFUSER DRILL AIR PNEUMATIC (MISCELLANEOUS) IMPLANT
DRAPE C-ARM 42X72 X-RAY (DRAPES) IMPLANT
DRAPE C-ARMOR (DRAPES) IMPLANT
DRAPE LAPAROTOMY 100X72X124 (DRAPES) IMPLANT
DRAPE SURG 17X23 STRL (DRAPES) IMPLANT
DURAPREP 26ML APPLICATOR (WOUND CARE) IMPLANT
ELECT REM PT RETURN 9FT ADLT (ELECTROSURGICAL)
ELECTRODE REM PT RTRN 9FT ADLT (ELECTROSURGICAL) IMPLANT
GAUZE 4X4 16PLY RFD (DISPOSABLE) IMPLANT
GAUZE SPONGE 4X4 12PLY STRL (GAUZE/BANDAGES/DRESSINGS) IMPLANT
GLOVE BIO SURGEON STRL SZ7.5 (GLOVE) IMPLANT
GLOVE BIOGEL PI IND STRL 7.5 (GLOVE) IMPLANT
GLOVE BIOGEL PI INDICATOR 7.5 (GLOVE)
GLOVE ECLIPSE 7.0 STRL STRAW (GLOVE) IMPLANT
GLOVE EXAM NITRILE XL STR (GLOVE) IMPLANT
GOWN STRL REUS W/ TWL LRG LVL3 (GOWN DISPOSABLE) IMPLANT
GOWN STRL REUS W/ TWL XL LVL3 (GOWN DISPOSABLE) IMPLANT
GOWN STRL REUS W/TWL 2XL LVL3 (GOWN DISPOSABLE) IMPLANT
GOWN STRL REUS W/TWL LRG LVL3 (GOWN DISPOSABLE)
GOWN STRL REUS W/TWL XL LVL3 (GOWN DISPOSABLE)
HEMOSTAT POWDER KIT SURGIFOAM (HEMOSTASIS) IMPLANT
KIT BASIN OR (CUSTOM PROCEDURE TRAY) ×2 IMPLANT
KIT POSITION SURG JACKSON T1 (MISCELLANEOUS) IMPLANT
KIT TURNOVER KIT B (KITS) IMPLANT
MILL MEDIUM DISP (BLADE) IMPLANT
NEEDLE HYPO 18GX1.5 BLUNT FILL (NEEDLE) IMPLANT
NEEDLE HYPO 22GX1.5 SAFETY (NEEDLE) IMPLANT
NEEDLE SPNL 18GX3.5 QUINCKE PK (NEEDLE) IMPLANT
NS IRRIG 1000ML POUR BTL (IV SOLUTION) IMPLANT
OIL CARTRIDGE MAESTRO DRILL (MISCELLANEOUS)
PACK LAMINECTOMY NEURO (CUSTOM PROCEDURE TRAY) IMPLANT
PAD ARMBOARD 7.5X6 YLW CONV (MISCELLANEOUS) IMPLANT
SPONGE LAP 4X18 RFD (DISPOSABLE) IMPLANT
SPONGE SURGIFOAM ABS GEL 100 (HEMOSTASIS) IMPLANT
STRIP CLOSURE SKIN 1/2X4 (GAUZE/BANDAGES/DRESSINGS) IMPLANT
SUT VIC AB 0 CT1 18XCR BRD8 (SUTURE) IMPLANT
SUT VIC AB 0 CT1 8-18 (SUTURE)
SUT VICRYL 3-0 RB1 18 ABS (SUTURE) IMPLANT
SYR 3ML LL SCALE MARK (SYRINGE) IMPLANT
TOWEL GREEN STERILE (TOWEL DISPOSABLE) IMPLANT
TOWEL GREEN STERILE FF (TOWEL DISPOSABLE) IMPLANT
TRAY FOLEY MTR SLVR 16FR STAT (SET/KITS/TRAYS/PACK) IMPLANT
WATER STERILE IRR 1000ML POUR (IV SOLUTION) IMPLANT

## 2020-01-26 NOTE — Progress Notes (Signed)
CRITICAL VALUE ALERT  Critical Value:  Troponin 35, lactic acid 2.1  Date & Time Notied:  01/26/20 2200  Provider Notified: Dr Myna Hidalgo  Orders Received/Actions taken: see orders.

## 2020-01-26 NOTE — H&P (Addendum)
History and Physical  Amber Moss YJE:[id number] DOB: 06/13/1975 DOA: 01/26/2020  Referring physician: Dr. Linna Caprice PCP: Rosine Door  Outpatient Specialists: Neurosurgery. Patient coming from: PACU  Chief Complaint: Postoperative arrest  HPI: Amber Moss is a 44 y.o. female with medical history significant for severe morbid obesity, chronic back pain post L5/S1 decompression and fusion about 2 months ago with removal of previous L4 screws.  She has been complaining of progressively worsening left-sided back pain associated with left leg numbness and weakness.  Worsened with movements.  Per neurosurgery she had a very small area of dehiscence of the wound with some mild amount of drainage which healed completely after a few weeks of oral antibiotics.  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  She presented today for wound exploration.  When patient was placed prone she desaturated abruptly with O2 saturation in the 80%.  Her ET tube was suctionned without significant improvement.  Her blood pressure also dropped significantly with SBP of 45.  She arrested.  She had chest compressions for less than a minute while epinephrine was administered and her blood pressure returned to 100s.  She required intermittent phenylephrine to maintain her blood pressure.  A right radial arterial line was obtained by Dr. Linna Caprice, anesthesia.  She was returned to the PACU intubated, monitored off sedation and vasopressors, then extubated.  She was evaluated by cardiology.  Cardiology is planning echocardiography, troponin and EKG trending and reassessment in the morning.  Will obtain a D-dimer, ESR, CRP, lactic acid and procalcitonin.  Cardiology and anesthesia requested hospitalist admission.  ED Course: Direct admit from PACU.  Review of  Systems: Review of systems as noted in the HPI. All other systems reviewed and are negative.   Past Medical History:  Diagnosis Date  . Arthritis    back  . Depression   . Easy bruising   . Fatty liver   . GERD (gastroesophageal reflux disease)   . Hypercholesterolemia    diet controlled, no med  . Hypothyroidism   . Migraines   . Polycystic ovarian disease   . PONV (postoperative nausea and vomiting)    Past Surgical History:  Procedure Laterality Date  . ABLATION     cervical ablation at MD's Office  . BACK SURGERY  12/2017, 11/22/19  . CARPAL TUNNEL RELEASE Bilateral   . CHOLECYSTECTOMY  04/2007   Dr. Lindalou Hose  . DILATION AND CURETTAGE OF UTERUS  12/2011  . EYE SURGERY Bilateral    Lasik  . LUMBAR LAMINECTOMY/DECOMPRESSION MICRODISCECTOMY Left 10/22/2017   Procedure: LUMBAR - FIVE LAMINOTOMY AND MICRODISCECTOMY;  Surgeon: Consuella Lose, MD;  Location: [employer];  Service: Neurosurgery;  Laterality: Left;   LUMBAR - FIVE LAMINOTOMY AND MICRODISCECTOMY    Social History:  reports that she has never smoked. She has never used smokeless tobacco. She reports that she does not drink alcohol and does not use drugs.   Allergies  Allergen Reactions  . Doxycycline Hives, Diarrhea and Nausea And Vomiting  . Keflex [Cephalexin] Hives, Diarrhea and Nausea And Vomiting  . Baclofen Other (See Comments)    Migraine  . Vancomycin Itching    Family History  Problem Relation Age of Onset  . Thyroid disease Maternal Grandfather   . Hypertension Mother   . Hyperlipidemia Father   . Hypertension Father   . Diabetes Mellitus II Father  Prior to Admission medications   Medication Sig Start Date End Date Taking? Authorizing Provider  citalopram (CELEXA) 40 MG tablet Take 40 mg by mouth daily. 10/20/18  Yes [provider]  Erenumab-aooe (AIMOVIG) 140 MG/ML SOAJ Inject 140 mg into the skin every 30 (thirty) days.   Yes [provider]  ibuprofen (ADVIL) 800  MG tablet Take 800 mg by mouth daily as needed for mild pain or moderate pain.  12/01/18  Yes [provider]  levothyroxine (SYNTHROID) 200 MCG tablet Take 1 tablet (200 mcg total) by mouth daily before breakfast. Patient taking differently: Take 200 mcg by mouth daily before breakfast. MUST HAVE BRAND NAME . Take with 50 mg for a total of 250 mg 12/24/18  Yes Shamleffer, Melanie Crazier, MD  levothyroxine (SYNTHROID) 50 MCG tablet Take 50 mcg by mouth daily before breakfast. everyday with Synthroid 200 mg. MUST HAVE BRAND NAME   Yes [provider]  meloxicam (MOBIC) 15 MG tablet Take 15 mg by mouth daily as needed for pain.    Yes [provider]  pantoprazole (PROTONIX) 40 MG tablet Take 40 mg by mouth daily as needed (reflux).    Yes [provider]  rizatriptan (MAXALT) 10 MG tablet Take 10 mg by mouth as needed for migraine.  07/12/18  Yes [provider]  RYBELSUS 7 MG TABS Take 1 tablet by mouth daily. 01/23/20  Yes [provider]  traMADol (ULTRAM) 50 MG tablet Take 50 mg by mouth 3 (three) times daily as needed for moderate pain.    Yes [provider]  pregabalin (LYRICA) 75 MG capsule Take 1 capsule (75 mg total) by mouth 2 (two) times daily. Patient not taking: Reported on 01/24/2020 10/06/19   Melvenia Beam, MD  rOPINIRole (REQUIP) 0.25 MG tablet Take 0.25 mg by mouth at bedtime as needed (restless leg).     [provider]    Physical Exam: BP 107/78 (BP Location: Left Arm)   Pulse (!) 109   Temp (!) 97.4 F (36.3 C)   Resp 13   Ht '5\' 4"'  (1.626 m)   Wt 127 kg   LMP  (LMP Unknown)   SpO2 97%   BMI 48.06 kg/m   . General: 44 y.o. year-old female well developed well nourished in no acute distress.  Alert and oriented x3. . Cardiovascular: Regular rate and rhythm with no rubs or gallops.  No thyromegaly or JVD noted.  Trace lower extremity edema. 2/4 pulses in all 4 extremities. Marland Kitchen Respiratory: Clear to  auscultation with no wheezes or rales. Good inspiratory effort. . Abdomen: Soft nontender nondistended with normal bowel sounds x4 quadrants. . Muskuloskeletal: No cyanosis or clubbing.  Trace LE edema noted bilaterally . Neuro: CN II-XII intact, strength, sensation, reflexes.  Scar noted on lower spine with mild tenderness with palpation. . Skin: No ulcerative lesions noted or rashes . Psychiatry: Judgement and insight appear normal. Mood is appropriate for condition and setting          Labs on Admission:  Basic Metabolic Panel: Recent Labs  Lab 01/26/20 1220 01/26/20 1512 01/26/20 1539 01/26/20 1544  NA 140 142 138 137  K 3.8 2.4* 3.2* 3.1*  CL 104  --  104  --   CO2 24  --  20*  --   GLUCOSE 115*  --  149*  --   BUN 8  --  10  --   CREATININE 0.78  --  0.83  --  CALCIUM 9.0  --  8.5*  --    Liver Function Tests: Recent Labs  Lab 01/26/20 1220  AST 33  ALT 29  ALKPHOS 61  BILITOT 1.0  PROT 7.1  ALBUMIN 4.0   No results for input(s): LIPASE, AMYLASE in the last 168 hours. No results for input(s): AMMONIA in the last 168 hours. CBC: Recent Labs  Lab 01/26/20 1220 01/26/20 1512 01/26/20 1539 01/26/20 1544  WBC 6.7  --  9.0  --   HGB 13.5 10.5* 13.0 12.6  HCT 41.5 31.0* 39.7 37.0  MCV 92.4  --  90.8  --   PLT 239  --  231  --    Cardiac Enzymes: No results for input(s): CKTOTAL, CKMB, CKMBINDEX, TROPONINI in the last 168 hours.  BNP (last 3 results) No results for input(s): BNP in the last 8760 hours.  ProBNP (last 3 results) No results for input(s): PROBNP in the last 8760 hours.  CBG: No results for input(s): GLUCAP in the last 168 hours.  Radiological Exams on Admission: DG CHEST PORT 1 VIEW  Result Date: 01/26/2020 CLINICAL DATA:  Status post 2 cardiac arrests prior to scheduled surgery. The patient did not have the surgery. Endotracheal tube repositioned. EXAM: PORTABLE CHEST 1 VIEW COMPARISON:  3 minutes earlier. FINDINGS: Endotracheal tube in  satisfactory position. Normal sized heart. Stable previously described bilateral atelectasis. No pneumothorax. Unremarkable bones. IMPRESSION: 1. Endotracheal tube in satisfactory position. 2. Stable bilateral atelectasis. Electronically Signed   By: Claudie Revering M.D.   On: 01/26/2020 16:38   DG CHEST PORT 1 VIEW  Result Date: 01/26/2020 CLINICAL DATA:  Two episodes of cardiac arrest today prior to scheduled surgery. EXAM: PORTABLE CHEST 1 VIEW COMPARISON:  04/29/2019 FINDINGS: Endotracheal tube tip at the level of the mid clavicles projected 1.3 cm above the carina. Very poor depth of inspiration. Interval subsegmental atelectasis in the right upper lobe and minimal linear atelectasis at the lung bases. Otherwise, clear lungs with normal vascularity. No pneumothorax. Unremarkable bones. IMPRESSION: 1. Endotracheal tube tip 1.3 cm above the carina. This is a good position based on the tip at the level of the mid clavicles. 2. Very poor depth of inspiration with interval right upper lobe and minimal bibasilar atelectasis. Electronically Signed   By: Claudie Revering M.D.   On: 01/26/2020 16:37    EKG: I independently viewed the EKG done and my findings are as followed: None available at the time of this visit.  Assessment/Plan Present on Admission: . Respiratory failure with hypoxia (HCC)  Active Problems:   Post op infection   Respiratory failure with hypoxia (HCC)  Severe hypoxia upon pronation which led to bradycardia asystolic event with brief CPR, required vasopressor, resolved. Possibly secondary to anesthesia, possible neuro mediated event. Will get 2D echo, trend troponin and twelve-lead EKG Will obtain a D-dimer, follow results Currently she is saturating 97% on room air Her ABG did not show hypercarbia, normal pH. Continue to closely monitor on telemetry.  Shock, unclear etiology She briefly required vasopressor Neo-Synephrine post arrest. Continue to monitor vital signs and maintain  MAP greater than 65. Start gentle IV fluid hydration Closely monitor vital signs  Hypokalemia Serum potassium 3.1 Repleted orally and intravenously Obtain magnesium level Ordered 2 g IV magnesium once Repeat BMP to monitor  Tachycardia Will obtain 12 lead EKG and TSH to further assess IV fluid hydration Monitor on telemetry   Mild anion gap metabolic acidosis Presented with serum bicarb of 20 with anion gap  of 14 No AKI Obtain lactic acid level Provide IV fluid Repeat BMP in the morning  Post L5-S1 decompression and fusion with severe left-sided back pain and left leg numbness and weakness Per neurosurgery she has postoperative fluid collection with apparent significant stenosis at L5-S1 Initial plan today was to proceed with operative exploration of the wound based on CT.  Procedure was aborted due to event stated above.   Management per neurosurgery  Hypothyroidism Obtain TSH Resume home levothyroxine  Migraines Resume home regimen  Type 2 diabetes with hyperglycemia. Obtain hemoglobin A1c Start insulin sliding scale  Essential hypertension/depression Resume home Celexa  Polyneuropathy Resume Lyrica  Restless leg syndrome Resume Requip  Severe morbid obesity BMI 48 Recommend weight loss outpatient with lifestyle modification   DVT prophylaxis: Subcu Lovenox daily  Code Status: Full code as stated by the patient herself.   Family Communication: Mother at bedside.  Disposition Plan: Admit to telemetry cardiac.  Consults called: Cardiology and neurosurgery following  Admission status: Observation status.   Status is: Observation   Dispo:  Patient From: Home  Planned Disposition: Home  Expected discharge date: 01/27/20  Medically stable for discharge: No, ongoing management of symptomatology post arrest.   Kayleen Memos MD [facility] Pager [phone]  If 7PM-7AM, please contact night-coverage [email or web address] Password  [email or web address]  01/26/2020, 8:25 PM

## 2020-01-26 NOTE — H&P (Signed)
Chief Complaint   Back pain  HPI   HPI: Amber Moss is a 44 y.o. female who underwent L5-S1 decompression and fusion about 2 months ago with removal of previous L4 screws.  While she did well for about a week postoperatively, she has been complaining of progressively worsening left-sided back pain with left leg numbness and weakness.  Pain appears to be primarily mechanical in nature, worsened with any kind of movement.  She also notes relatively severe subjective left leg numbness and weakness.  Initially, she did have a very small area of dehiscence of her wound with some mild amount of drainage.  This healed completely after a few weeks of oral antibiotics.  Because of continued pain an MRI of her lumbar spine was ordered revealing apparent residual stenosis at L5-S1 from postoperative fluid collection, unclear whether this represents infection. Over the last 2 days she has experienced relatively severe HA and nausea when sitting or standing upright, relieved when supine. No fluid leak from her wound. She presents today for wound exploration.    Patient Active Problem List   Diagnosis Date Noted  . Post op infection 01/26/2020  . Lumbosacral radiculopathy at S1 10/06/2019  . 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 HNP (herniated nucleus pulposus), lumbar 10/22/2017  . Lumbar herniated disc 10/22/2017    PMH: Past Medical History:  Diagnosis Date  . Arthritis    back  . Depression   . Easy bruising   . Fatty liver   . GERD (gastroesophageal reflux disease)   . Hypercholesterolemia    diet controlled, no med  . Hypothyroidism   . Migraines   . Polycystic ovarian disease   . PONV (postoperative nausea and vomiting)     PSH: Past Surgical History:  Procedure Laterality Date  . ABLATION     cervical ablation at MD's Office  . BACK SURGERY  12/2017, 11/22/19  . CARPAL TUNNEL RELEASE Bilateral   . CHOLECYSTECTOMY  04/2007   Dr. Lindalou Hose  . DILATION AND CURETTAGE  OF UTERUS  12/2011  . EYE SURGERY Bilateral    Lasik  . LUMBAR LAMINECTOMY/DECOMPRESSION MICRODISCECTOMY Left 10/22/2017   Procedure: LUMBAR - FIVE LAMINOTOMY AND MICRODISCECTOMY;  Surgeon: Consuella Lose, MD;  Location: [facility];  Service: Neurosurgery;  Laterality: Left;   LUMBAR - FIVE LAMINOTOMY AND MICRODISCECTOMY    No medications prior to admission.    SH: Social History   Tobacco Use  . Smoking status: Never Smoker  . Smokeless tobacco: Never Used  Vaping Use  . Vaping Use: Never used  Substance Use Topics  . Alcohol use: Never  . Drug use: Never    MEDS: Prior to Admission medications   Medication Sig Start Date End Date Taking? Authorizing Provider  citalopram (CELEXA) 40 MG tablet Take 40 mg by mouth daily. 10/20/18  Yes [provider]  Erenumab-aooe (AIMOVIG) 140 MG/ML SOAJ Inject 140 mg into the skin every 30 (thirty) days.   Yes [provider]  ibuprofen (ADVIL) 800 MG tablet Take 800 mg by mouth daily as needed for mild pain or moderate pain.  12/01/18  Yes [provider]  levothyroxine (SYNTHROID) 200 MCG tablet Take 1 tablet (200 mcg total) by mouth daily before breakfast. Patient taking differently: Take 200 mcg by mouth daily before breakfast. MUST HAVE BRAND NAME . Take with 50 mg for a total of 250 mg 12/24/18  Yes Shamleffer, Melanie Crazier, MD  levothyroxine (SYNTHROID) 50 MCG tablet Take 50 mcg by mouth  daily before breakfast. everyday with Synthroid 200 mg. MUST HAVE BRAND NAME   Yes [provider]  meloxicam (MOBIC) 15 MG tablet Take 15 mg by mouth daily as needed for pain.    Yes [provider]  pantoprazole (PROTONIX) 40 MG tablet Take 40 mg by mouth daily as needed (reflux).    Yes [provider]  rizatriptan (MAXALT) 10 MG tablet Take 10 mg by mouth as needed for migraine.  07/12/18  Yes [provider]  rOPINIRole (REQUIP) 0.25 MG tablet Take 0.25 mg by mouth at bedtime as needed  (restless leg).    Yes [provider]  traMADol (ULTRAM) 50 MG tablet Take 50 mg by mouth 3 (three) times daily as needed for moderate pain.    Yes [provider]  pregabalin (LYRICA) 75 MG capsule Take 1 capsule (75 mg total) by mouth 2 (two) times daily. Patient not taking: Reported on 01/24/2020 10/06/19   Melvenia Beam, MD  RYBELSUS 7 MG TABS Take 1 tablet by mouth daily. 01/23/20   [provider]    ALLERGY: Allergies  Allergen Reactions  . Doxycycline Hives, Diarrhea and Nausea And Vomiting  . Keflex [Cephalexin] Hives, Diarrhea and Nausea And Vomiting  . Baclofen Other (See Comments)    Migraine  . Vancomycin Itching    Social History   Tobacco Use  . Smoking status: Never Smoker  . Smokeless tobacco: Never Used  Substance Use Topics  . Alcohol use: Never     Family History  Problem Relation Age of Onset  . Thyroid disease Maternal Grandfather   . Hypertension Mother   . Hyperlipidemia Father   . Hypertension Father   . Diabetes Mellitus II Father      ROS   ROS  Exam   There were no vitals filed for this visit. General appearance: WDWN, NAD Eyes: No scleral injection Cardiovascular: Regular rate and rhythm without murmurs, rubs, gallops. No edema or variciosities. Distal pulses normal. Pulmonary: Effort normal, non-labored breathing Musculoskeletal:     Muscle tone upper extremities: Normal    Muscle tone lower extremities: Normal    Motor exam: Upper Extremities Deltoid Bicep Tricep Grip  Right 5/5 5/5 5/5 5/5  Left 5/5 5/5 5/5 5/5   Lower Extremity IP Quad PF DF EHL  Right 5/5 5/5 5/5 5/5 5/5  Left 5/5 5/5 5/5 5/5 5/5   Neurological Mental Status:    - Patient is awake, alert, oriented to person, place, month, year, and situation    - Patient is able to give a clear and coherent history.    - No signs of aphasia or neglect Cranial Nerves    - II: Visual Fields are full. PERRL    - III/IV/VI: EOMI without ptosis  or diploplia.     - V: Facial sensation is grossly normal    - VII: Facial movement is symmetric.     - VIII: hearing is intact to voice    - X: Uvula elevates symmetrically    - XI: Shoulder shrug is symmetric.    - XII: tongue is midline without atrophy or fasciculations.  Sensory: Sensation grossly intact to LT  Results - Imaging/Labs   No results found for this or any previous visit (from the past 48 hour(s)).  No results found.  IMAGING: MRI of the lumbar spine was personally reviewed.  This demonstrates significant amount of hardware artifact at the L5-S1 level.  There appears to be some susceptibility artifact ventral  to the thecal sac at the L5-S1 interspace, unclear if this is posterior displacement of the cage verses hardware related artifact.  There does appear to be a fluid collection dorsal to the thecal sac.  Again, because of the artifact it is difficult to adequately interpret however there does appear to be significant residual spinal stenosis at this level.  There is minimal hyperintensity on FLAIR signal of the L5 and S1 endplates.  AP and lateral lumbar spine x-rays done in the office today were also personally reviewed.  These appear to demonstrate stable position of the implanted hardware.  On these x-rays, the L5-S1 interbody cages do not appear to be posteriorly displaced.  CT SCAN: Good interbody fusion at L4-5. Left L5-S1 cage is slightly posterior perhaps 1-75mm within the foramen. Otherwise no hardware complications. Screws appear well seated without halo.  Impression/Plan   44 y.o. female two months status post L5-S1 decompression and fusion with severe left-sided mechanical back pain and left leg weakness.  She does have postoperative fluid collection with apparent significant stenosis at L5-S1.  More recent symptoms are suggestive of delayed CSF leak. We will proceed with operative exploration of the wound, based on CT unlikely to need hardware revision.  We  have reviewed the indications for surgery, the associated risks, benefits and alternatives at length in the office.  All questions today were answered and consent was obtained.  Consuella Lose, MD [facility]

## 2020-01-26 NOTE — Transfer of Care (Signed)
Immediate Anesthesia Transfer of Care Note  Patient: Amber Moss  Procedure(s) Performed: CASE CANCELED (N/A )  Patient Location: PACU  Anesthesia Type:General  Level of Consciousness: Patient remains intubated per anesthesia plan  Airway & Oxygen Therapy: Patient remains intubated per anesthesia plan and Patient placed on Ventilator (see vital sign flow sheet for setting)  Post-op Assessment: Report given to RN and Post -op Vital signs reviewed and stable  Post vital signs: Reviewed and stable  Last Vitals:  Vitals Value Taken Time  BP 121/76 01/26/20 1601  Temp 36.1 C 01/26/20 1530  Pulse 109 01/26/20 1606  Resp 17 01/26/20 1530  SpO2 100 % 01/26/20 1606  Vitals shown include unvalidated device data.  Last Pain:  Vitals:   01/26/20 1229  TempSrc:   PainSc: 6       Patients Stated Pain Goal: 1 (90/90/30 1499)  Complications: No complications documented.

## 2020-01-26 NOTE — Progress Notes (Signed)
Patient unable to void in short stay and is a very hard stick so unable to obtain a serum draw.  Per Dr. Linna Caprice, ok to proceed without POCT Urine Preg or serum hcg.  OR RN and CRNA made aware. Patient agrees that it is ok to proceed with surgery without confirming pregnancy status.

## 2020-01-26 NOTE — Progress Notes (Addendum)
I was unable to perform the planned surgery. Upon induction of GA, pt was turned prone. She quickly desaturated into the 80% range and NIBP noted to be decreasing. ET tube was suctioned without significant improvement. She was returned to the stretcher supine where pressure continued to drop. We did perform chest compressions for <16min while epinephrine was administered and her blood pressure returned to the 100s. She required intermittent phenylephrine to maintain normal BP while arterial line was ultimately obtained by Dr. Linna Caprice (anesthesia) in the right radial a. Both Dr. Linna Caprice and I felt it unwise to attempt to proceed with surgery without further workup of her cardiorespiratory instability. She was returned to the PACU intubated where the plan is to monitor her off sedation and vasopressors.   Will obtain routine labs, cardiac enzymes, CXR, and 12-lead ECG. If these are normal and if she remains stable hemodynamically, we can try to extubate. Will need cardiology evaluation for further testing.   I have spoken with the patient's mother and updated her regarding the events and plan above.

## 2020-01-26 NOTE — Progress Notes (Signed)
Right radial arterial line removed.  Catheter intact.  Pressure held for 10 minutes and dressing applied.  Site unremarkable, patient tolerated well.

## 2020-01-26 NOTE — Anesthesia Procedure Notes (Signed)
Procedure Name: Intubation Date/Time: 01/26/2020 1:57 PM Performed by: Cleda Daub, CRNA Pre-anesthesia Checklist: Patient identified, Emergency Drugs available, Suction available and Patient being monitored Patient Re-evaluated:Patient Re-evaluated prior to induction Oxygen Delivery Method: Circle system utilized Preoxygenation: Pre-oxygenation with 100% oxygen Induction Type: IV induction Ventilation: Mask ventilation without difficulty Laryngoscope Size: Miller and 1 Grade View: Grade I Tube type: Oral Tube size: 7.0 mm Number of attempts: 1 Airway Equipment and Method: Stylet and Oral airway Placement Confirmation: ETT inserted through vocal cords under direct vision,  positive ETCO2 and breath sounds checked- equal and bilateral Secured at: 21 cm Tube secured with: Tape Dental Injury: Teeth and Oropharynx as per pre-operative assessment

## 2020-01-26 NOTE — Anesthesia Procedure Notes (Signed)
Arterial Line Insertion Start/End11/18/2021 2:34 PM, 01/26/2020 3:09 PM Performed by: Roberts Gaudy, MD, anesthesiologist  Patient location: OR. Preanesthetic checklist: patient identified, IV checked, site marked, risks and benefits discussed, surgical consent, monitors and equipment checked, pre-op evaluation and anesthesia consent Left, radial was placed Catheter size: 20 G  Procedure performed using ultrasound guided technique. Ultrasound Notes:anatomy identified, needle tip was noted to be adjacent to the nerve/plexus identified and no ultrasound evidence of intravascular and/or intraneural injection Following insertion, dressing applied and Biopatch.

## 2020-01-26 NOTE — Consult Note (Addendum)
The patient has been seen in conjunction with Cadence Kathlen Mody, PAC. All aspects of care have been considered and discussed. The patient has been personally interviewed, examined, and all clinical data has been reviewed.   The history is a noted below.  Developed severe hypoxia upon pronation which led to brady-asystolic event with brief CPR, pressor requirement, and then resolution back to normal.  ECG is normal.  Exam is normal.  This is the second time in 2 months that I have seen a similar scenario with pronation. Seems to be a possible anesthesia/neuro mediated event. R/o other such as occult LV dysfunction or other congenital or structural abnormality.  Plan Echocardiography, troponin I and ECG trending and reassessment in AM.  Need to discover why hypoxia developed. Will check a D-dimer. Echo will asses RV size and function.  We are consulting and request Hospitalist admission.  Cardiology Consultation:   Patient ID: Amber Moss MRN: [id number]; DOB: 1975-08-25  Admit date: 01/26/2020 Date of Consult: 01/26/2020  Primary Care Provider: Rosine [facility] Cardiologist: [facility] Electrophysiologist:  None    Patient Profile:   Amber Moss is a 44 y.o. female with a hx of lumbosacral radiculoipathy, spondylolysis of lumbar region, lumbar herniated disc, 3 prior back surgeries, hypothyroidism, GERD, fatty liver  who is being seen today for the evaluation of cardiac arrest at the request of Dr. Kathyrn Sheriff.  History of Present Illness:   Amber Moss has not been seen by cardiology in the past. No prior ischemic evaluation. Family history positive for maternal GM with CHG, maternal aunt with CHF, and maternal uncle with CAD and stenting. She works as a Technical brewer. No tobacco, alcohol, or drug use. She has a 43 year old and this keeps her active. The day prior to surgery she said she was vomiting all day.   The patient underwent prior L5-S1  decompression and fusion surgery 2 months ago with removal of previous L4 screw. After a week she was complainging of left sided back pain with left leg numbness and weakness. She oral abx post-op perior. Due to persistent pain an MRI ws obtained that showed residual stenosis at L5-S1 from post-op fluid collection. The patient was scheduled for wound exploration.   The patient arrived for he scheduled procedure 01/26/20 and she was induced and when she turned prone she desated into 80% range which continued to worsen and lost pulse. She was turned over and chest compressions were performed for less than 1 minute and epi was administered and BP improved to SBP of 100. She required intermittent phenylephrine to maintain normal BP.  Surgery was deferred until further work-up and she was taken back to the PACU.   Labs: Potassium 2.4, repeat 3.2 Sodium 142 creatininine 0.83, BUN 10 HS troponin 10 WBC 9.0 Hgb 10.5  CXR: stable b/l atelectasis  EKG: sinus tachycardia, 119bpm   Past Medical History:  Diagnosis Date  . Arthritis    back  . Depression   . Easy bruising   . Fatty liver   . GERD (gastroesophageal reflux disease)   . Hypercholesterolemia    diet controlled, no med  . Hypothyroidism   . Migraines   . Polycystic ovarian disease   . PONV (postoperative nausea and vomiting)     Past Surgical History:  Procedure Laterality Date  . ABLATION     cervical ablation at MD's Office  . BACK SURGERY  12/2017, 11/22/19  . CARPAL TUNNEL RELEASE Bilateral   . CHOLECYSTECTOMY  04/2007   Dr. Lindalou Hose  . DILATION AND CURETTAGE OF UTERUS  12/2011  . EYE SURGERY Bilateral    Lasik  . LUMBAR LAMINECTOMY/DECOMPRESSION MICRODISCECTOMY Left 10/22/2017   Procedure: LUMBAR - FIVE LAMINOTOMY AND MICRODISCECTOMY;  Surgeon: Consuella Lose, MD;  Location: [place];  Service: Neurosurgery;  Laterality: Left;   LUMBAR - FIVE LAMINOTOMY AND MICRODISCECTOMY     Home Medications:  Prior to Admission  medications   Medication Sig Start Date End Date Taking? Authorizing Provider  citalopram (CELEXA) 40 MG tablet Take 40 mg by mouth daily. 10/20/18  Yes [provider]  Erenumab-aooe (AIMOVIG) 140 MG/ML SOAJ Inject 140 mg into the skin every 30 (thirty) days.   Yes [provider]  ibuprofen (ADVIL) 800 MG tablet Take 800 mg by mouth daily as needed for mild pain or moderate pain.  12/01/18  Yes [provider]  levothyroxine (SYNTHROID) 200 MCG tablet Take 1 tablet (200 mcg total) by mouth daily before breakfast. Patient taking differently: Take 200 mcg by mouth daily before breakfast. MUST HAVE BRAND NAME . Take with 50 mg for a total of 250 mg 12/24/18  Yes Shamleffer, Melanie Crazier, MD  levothyroxine (SYNTHROID) 50 MCG tablet Take 50 mcg by mouth daily before breakfast. everyday with Synthroid 200 mg. MUST HAVE BRAND NAME   Yes [provider]  meloxicam (MOBIC) 15 MG tablet Take 15 mg by mouth daily as needed for pain.    Yes [provider]  pantoprazole (PROTONIX) 40 MG tablet Take 40 mg by mouth daily as needed (reflux).    Yes [provider]  rizatriptan (MAXALT) 10 MG tablet Take 10 mg by mouth as needed for migraine.  07/12/18  Yes [provider]  RYBELSUS 7 MG TABS Take 1 tablet by mouth daily. 01/23/20  Yes [provider]  traMADol (ULTRAM) 50 MG tablet Take 50 mg by mouth 3 (three) times daily as needed for moderate pain.    Yes [provider]  pregabalin (LYRICA) 75 MG capsule Take 1 capsule (75 mg total) by mouth 2 (two) times daily. Patient not taking: Reported on 01/24/2020 10/06/19   Melvenia Beam, MD  rOPINIRole (REQUIP) 0.25 MG tablet Take 0.25 mg by mouth at bedtime as needed (restless leg).     [provider]    Inpatient Medications: Scheduled Meds: . Chlorhexidine Gluconate Cloth  6 each Topical Once   And  . Chlorhexidine Gluconate Cloth  6 each Topical Once   Continuous  Infusions: . lactated ringers 10 mL/hr at 01/26/20 1258   PRN Meds: fentaNYL (SUBLIMAZE) injection, ondansetron (ZOFRAN) IV  Allergies:    Allergies  Allergen Reactions  . Doxycycline Hives, Diarrhea and Nausea And Vomiting  . Keflex [Cephalexin] Hives, Diarrhea and Nausea And Vomiting  . Baclofen Other (See Comments)    Migraine  . Vancomycin Itching    Social History:   Social History   Socioeconomic History  . Marital status: Married    Spouse name: Not on file  . Number of children: Not on file  . Years of education: Not on file  . Highest education level: Not on file  Occupational History  . Not on file  Tobacco Use  . Smoking status: Never Smoker  . Smokeless tobacco: Never Used  Vaping Use  . Vaping Use: Never used  Substance and Sexual Activity  . Alcohol use: Never  . Drug use: Never  . Sexual activity: Not Currently    Comment: ablation  Other Topics Concern  . Not on file  Social History Narrative   Lives at home with spouse    Right handed   Caffeine: 1 cup of coffee in the morning   Social Determinants of Health   Financial Resource Strain:   . Difficulty of Paying Living Expenses: Not on file  Food Insecurity:   . Worried About Charity fundraiser in the Last Year: Not on file  . Ran Out of Food in the Last Year: Not on file  Transportation Needs:   . Lack of Transportation (Medical): Not on file  . Lack of Transportation (Non-Medical): Not on file  Physical Activity:   . Days of Exercise per Week: Not on file  . Minutes of Exercise per Session: Not on file  Stress:   . Feeling of Stress : Not on file  Social Connections:   . Frequency of Communication with Friends and Family: Not on file  . Frequency of Social Gatherings with Friends and Family: Not on file  . Attends Religious Services: Not on file  . Active Member of Clubs or Organizations: Not on file  . Attends Archivist Meetings: Not on file  . Marital Status: Not on file   Intimate Partner Violence:   . Fear of Current or Ex-Partner: Not on file  . Emotionally Abused: Not on file  . Physically Abused: Not on file  . Sexually Abused: Not on file    Family History:   Family History  Problem Relation Age of Onset  . Thyroid disease Maternal Grandfather   . Hypertension Mother   . Hyperlipidemia Father   . Hypertension Father   . Diabetes Mellitus II Father      ROS:  Please see the history of present illness.  All other ROS reviewed and negative.     Physical Exam/Data:   Vitals:   01/26/20 1615 01/26/20 1630 01/26/20 1645 01/26/20 1715  BP: 108/82 110/74 99/68 132/78  Pulse: (!) 107 (!) 108 (!) 104 100  Resp:    15  Temp:   (!) 97.4 F (36.3 C)   TempSrc:      SpO2: 95% 99% 100% 100%  Weight:      Height:        Intake/Output Summary (Last 24 hours) at 01/26/2020 1722 Last data filed at 01/26/2020 1613 Gross per 24 hour  Intake 800 ml  Output 0 ml  Net 800 ml   Last 3 Weights 01/26/2020 01/25/2020 10/06/2019  Weight (lbs) 280 lb 284 lb 6.3 oz 284 lb  Weight (kg) 127.007 kg 129 kg 128.822 kg     Body mass index is 48.06 kg/m.  General:  Well nourished, well developed, in no acute distress HEENT: normal Lymph: no adenopathy Neck: no JVD Endocrine:  No thryomegaly Vascular: No carotid bruits; FA pulses 2+ bilaterally without bruits  Cardiac:  normal S1, S2; RRR; no murmur  Lungs:  clear to auscultation bilaterally, no wheezing, rhonchi or rales  Abd: soft, nontender, no hepatomegaly  Ext: no edema Musculoskeletal:  No deformities, BUE and BLE strength normal and equal Skin: warm and dry  Neuro:  CNs 2-12 intact, no focal abnormalities noted Psych:  Normal affect   EKG:  The EKG was personally reviewed and demonstrates:  Sinus tachycardia 119bpm Telemetry:  Telemetry was personally reviewed and demonstrates:  N/A  Relevant CV Studies:  Echo ordered  Laboratory Data:  High Sensitivity Troponin:   Recent Labs  Lab  01/26/20 1539  TROPONINIHS  10     Chemistry Recent Labs  Lab 01/26/20 1220 01/26/20 1220 01/26/20 1512 01/26/20 1539 01/26/20 1544  NA 140   < > 142 138 137  K 3.8   < > 2.4* 3.2* 3.1*  CL 104  --   --  104  --   CO2 24  --   --  20*  --   GLUCOSE 115*  --   --  149*  --   BUN 8  --   --  10  --   CREATININE 0.78  --   --  0.83  --   CALCIUM 9.0  --   --  8.5*  --   GFRNONAA >60  --   --  >60  --   ANIONGAP 12  --   --  14  --    < > = values in this interval not displayed.    Recent Labs  Lab 01/26/20 1220  PROT 7.1  ALBUMIN 4.0  AST 33  ALT 29  ALKPHOS 61  BILITOT 1.0   Hematology Recent Labs  Lab 01/26/20 1220 01/26/20 1220 01/26/20 1512 01/26/20 1539 01/26/20 1544  WBC 6.7  --   --  9.0  --   RBC 4.49  --   --  4.37  --   HGB 13.5   < > 10.5* 13.0 12.6  HCT 41.5   < > 31.0* 39.7 37.0  MCV 92.4  --   --  90.8  --   MCH 30.1  --   --  29.7  --   MCHC 32.5  --   --  32.7  --   RDW 14.0  --   --  13.9  --   PLT 239  --   --  231  --    < > = values in this interval not displayed.   BNPNo results for input(s): BNP, PROBNP in the last 168 hours.  DDimer No results for input(s): DDIMER in the last 168 hours.   Radiology/Studies:  DG CHEST PORT 1 VIEW  Result Date: 01/26/2020 CLINICAL DATA:  Status post 2 cardiac arrests prior to scheduled surgery. The patient did not have the surgery. Endotracheal tube repositioned. EXAM: PORTABLE CHEST 1 VIEW COMPARISON:  3 minutes earlier. FINDINGS: Endotracheal tube in satisfactory position. Normal sized heart. Stable previously described bilateral atelectasis. No pneumothorax. Unremarkable bones. IMPRESSION: 1. Endotracheal tube in satisfactory position. 2. Stable bilateral atelectasis. Electronically Signed   By: Claudie Revering M.D.   On: 01/26/2020 16:38   DG CHEST PORT 1 VIEW  Result Date: 01/26/2020 CLINICAL DATA:  Two episodes of cardiac arrest today prior to scheduled surgery. EXAM: PORTABLE CHEST 1 VIEW  COMPARISON:  04/29/2019 FINDINGS: Endotracheal tube tip at the level of the mid clavicles projected 1.3 cm above the carina. Very poor depth of inspiration. Interval subsegmental atelectasis in the right upper lobe and minimal linear atelectasis at the lung bases. Otherwise, clear lungs with normal vascularity. No pneumothorax. Unremarkable bones. IMPRESSION: 1. Endotracheal tube tip 1.3 cm above the carina. This is a good position based on the tip at the level of the mid clavicles. 2. Very poor depth of inspiration with interval right upper lobe and minimal bibasilar atelectasis. Electronically Signed   By: Claudie Revering M.D.   On: 01/26/2020 16:37     Assessment and Plan:   Cardiac arrest - Upon pronation the patient desaturated into the 80s, became hypotensive and lost pulses. Chest compressions performed less than  1 minute and administered epinephrine. BP required intermittent phenylephrine. Surgery was held and patient admitted for further work-up - labs showed hypokalemia, K2.4 with repeat 3.2 - HS troponin 10, continue to trend - CXR with b/l atelectasis - EKG showed sinus tachycardic - will order echo - place on telemetry floor - no prior history of CAD however RF included family history - risk stratify with A1C and FLP - check tsh and d-dimer with tachycardia  For questions or updates, please contact [facility] Please consult [email or web address] for contact info under    Signed, Cadence Ninfa Meeker, PA-C  01/26/2020 5:22 PM

## 2020-01-26 NOTE — Progress Notes (Signed)
Anesthesiology Note:  Amber Moss is a 44 year old female with a history of hypercholesterolemia, fatty liver disease, polycystic ovary syndrome, easy bruising, hypothyroidism, GERD, migraines, morbid obesity.  She has a history of low back pain with three previous back surgeries including an L5-S1 fusion on 11/22/2019.  She was now scheduled to undergo repeat surgery for I&D of her lumbar spine and possible hardware adjustment.  She was brought to the operating room and general anesthesia was induced without difficulty.  She was intubated on the second attempt using a Miller three blade.  She was then turned prone and developed increasing difficulty with ventilation and oxygen saturations in the 70s and 80s.  Copious clear secretions were suctioned from the endotracheal tube and she had bilateral wheezing.  She was then turned supine and her blood pressure was noted to be 40/25.  No pulses could be palpated.  CPR with chest compressions was initiated and she was given 150 mcg of epinephrine.  Within 1 to 2 minutes she developed an easily palpable pulse.  The patient required 50-70 mics per minute of Neo-Synephrine over the next 15 minutes to maintain her blood pressure above ninety systolic.  The Neo-Synephrine was then weaned off.  An arterial line was inserted initial blood gas while she was on the ventilator FiO2 0.1 showed PH 7.37 PCO2 33 PO2 260 BE- 5.   She was then taken to the recovery room.  A chest x-ray showed the endotracheal tube in satisfactory position with bibasilar and right lung atelectasis that was not severe.  She remained hemodynamically stable and was extubated in the recovery room uneventfully. Bmet was remarkable for a potassium of 3.1 and glucose of 149 otherwise normal.  A CBC was normal and high-sensitivity troponin was normal. ECG showed sinus tachycardia with no ST/T wave changes.  Patient now awake and alert in PACU. Denies chest pain, SOB  VS: T- 36.6 BP-  154/71HR- 90 RR- 12 O2 Sat 97% on RA   Heart- RR- no Murmurs Lungs- clear  Impression: 1. Acute Bronchospasm, hypoxemia, hypotension and brief intraoperative cardiac arrest. Patient stable at present.  Plan: 1. Cardiology Consult 2. Admit for overnight observation, consult [facility]  Roberts Gaudy, MD

## 2020-01-26 NOTE — Progress Notes (Signed)
Patient intubated and sedated on arrival to PACU.  Per Dr. Linna Caprice, chest x-ray, 12 Lead EKG, ABG, CBC, BMET, and troponin completed. Patient was reversed with Sugammadex at 1608 by Dr. Linna Caprice and extubated at [phone]. She maintained O2 Saturation 100% on 10L simple mask and followed all commands. All other vital signs stable. Dr. Kathyrn Sheriff also at bedside.

## 2020-01-27 ENCOUNTER — Observation Stay (HOSPITAL_COMMUNITY): Payer: BC Managed Care – PPO

## 2020-01-27 ENCOUNTER — Other Ambulatory Visit (HOSPITAL_COMMUNITY): Payer: BC Managed Care – PPO

## 2020-01-27 DIAGNOSIS — Y92234 Operating room of hospital as the place of occurrence of the external cause: Secondary | ICD-10-CM | POA: Diagnosis present

## 2020-01-27 DIAGNOSIS — R0902 Hypoxemia: Secondary | ICD-10-CM | POA: Diagnosis not present

## 2020-01-27 DIAGNOSIS — G8929 Other chronic pain: Secondary | ICD-10-CM | POA: Diagnosis present

## 2020-01-27 DIAGNOSIS — J9620 Acute and chronic respiratory failure, unspecified whether with hypoxia or hypercapnia: Secondary | ICD-10-CM | POA: Diagnosis not present

## 2020-01-27 DIAGNOSIS — M4807 Spinal stenosis, lumbosacral region: Secondary | ICD-10-CM | POA: Diagnosis present

## 2020-01-27 DIAGNOSIS — K219 Gastro-esophageal reflux disease without esophagitis: Secondary | ICD-10-CM | POA: Diagnosis present

## 2020-01-27 DIAGNOSIS — J9601 Acute respiratory failure with hypoxia: Secondary | ICD-10-CM | POA: Diagnosis not present

## 2020-01-27 DIAGNOSIS — Z6841 Body Mass Index (BMI) 40.0 and over, adult: Secondary | ICD-10-CM | POA: Diagnosis not present

## 2020-01-27 DIAGNOSIS — Z791 Long term (current) use of non-steroidal anti-inflammatories (NSAID): Secondary | ICD-10-CM | POA: Diagnosis not present

## 2020-01-27 DIAGNOSIS — Z20822 Contact with and (suspected) exposure to covid-19: Secondary | ICD-10-CM | POA: Diagnosis present

## 2020-01-27 DIAGNOSIS — J9801 Acute bronchospasm: Secondary | ICD-10-CM | POA: Diagnosis present

## 2020-01-27 DIAGNOSIS — I469 Cardiac arrest, cause unspecified: Secondary | ICD-10-CM | POA: Diagnosis not present

## 2020-01-27 DIAGNOSIS — T8131XA Disruption of external operation (surgical) wound, not elsewhere classified, initial encounter: Secondary | ICD-10-CM | POA: Diagnosis present

## 2020-01-27 DIAGNOSIS — Z8249 Family history of ischemic heart disease and other diseases of the circulatory system: Secondary | ICD-10-CM | POA: Diagnosis not present

## 2020-01-27 DIAGNOSIS — J95821 Acute postprocedural respiratory failure: Secondary | ICD-10-CM | POA: Diagnosis present

## 2020-01-27 DIAGNOSIS — Z881 Allergy status to other antibiotic agents status: Secondary | ICD-10-CM | POA: Diagnosis not present

## 2020-01-27 DIAGNOSIS — Z83438 Family history of other disorder of lipoprotein metabolism and other lipidemia: Secondary | ICD-10-CM | POA: Diagnosis not present

## 2020-01-27 DIAGNOSIS — E872 Acidosis: Secondary | ICD-10-CM | POA: Diagnosis present

## 2020-01-27 DIAGNOSIS — R579 Shock, unspecified: Secondary | ICD-10-CM | POA: Diagnosis present

## 2020-01-27 DIAGNOSIS — Z79899 Other long term (current) drug therapy: Secondary | ICD-10-CM | POA: Diagnosis not present

## 2020-01-27 DIAGNOSIS — Z888 Allergy status to other drugs, medicaments and biological substances status: Secondary | ICD-10-CM | POA: Diagnosis not present

## 2020-01-27 DIAGNOSIS — Z981 Arthrodesis status: Secondary | ICD-10-CM | POA: Diagnosis not present

## 2020-01-27 DIAGNOSIS — I248 Other forms of acute ischemic heart disease: Secondary | ICD-10-CM | POA: Diagnosis not present

## 2020-01-27 DIAGNOSIS — I97711 Intraoperative cardiac arrest during other surgery: Secondary | ICD-10-CM | POA: Diagnosis present

## 2020-01-27 DIAGNOSIS — J9811 Atelectasis: Secondary | ICD-10-CM | POA: Diagnosis not present

## 2020-01-27 DIAGNOSIS — Y838 Other surgical procedures as the cause of abnormal reaction of the patient, or of later complication, without mention of misadventure at the time of the procedure: Secondary | ICD-10-CM | POA: Diagnosis present

## 2020-01-27 DIAGNOSIS — Z79890 Hormone replacement therapy (postmenopausal): Secondary | ICD-10-CM | POA: Diagnosis not present

## 2020-01-27 DIAGNOSIS — Z9049 Acquired absence of other specified parts of digestive tract: Secondary | ICD-10-CM | POA: Diagnosis not present

## 2020-01-27 DIAGNOSIS — Z833 Family history of diabetes mellitus: Secondary | ICD-10-CM | POA: Diagnosis not present

## 2020-01-27 LAB — CBC WITH DIFFERENTIAL/PLATELET
Abs Immature Granulocytes: 0.03 10*3/uL (ref 0.00–0.07)
Basophils Absolute: 0 10*3/uL (ref 0.0–0.1)
Basophils Relative: 0 %
Eosinophils Absolute: 0 10*3/uL (ref 0.0–0.5)
Eosinophils Relative: 0 %
HCT: 38.6 % (ref 36.0–46.0)
Hemoglobin: 12.1 g/dL (ref 12.0–15.0)
Immature Granulocytes: 0 %
Lymphocytes Relative: 12 %
Lymphs Abs: 1 10*3/uL (ref 0.7–4.0)
MCH: 29.4 pg (ref 26.0–34.0)
MCHC: 31.3 g/dL (ref 30.0–36.0)
MCV: 93.9 fL (ref 80.0–100.0)
Monocytes Absolute: 0.2 10*3/uL (ref 0.1–1.0)
Monocytes Relative: 2 %
Neutro Abs: 7.5 10*3/uL (ref 1.7–7.7)
Neutrophils Relative %: 86 %
Platelets: 186 10*3/uL (ref 150–400)
RBC: 4.11 MIL/uL (ref 3.87–5.11)
RDW: 14.2 % (ref 11.5–15.5)
WBC: 8.7 10*3/uL (ref 4.0–10.5)
nRBC: 0 % (ref 0.0–0.2)

## 2020-01-27 LAB — ECHOCARDIOGRAM COMPLETE
Area-P 1/2: 3.23 cm2
Calc EF: 62 %
Height: 64 in
S' Lateral: 3.3 cm
Single Plane A2C EF: 55.4 %
Single Plane A4C EF: 66.9 %
Weight: 4643.2 oz

## 2020-01-27 LAB — COMPREHENSIVE METABOLIC PANEL
ALT: 27 U/L (ref 0–44)
AST: 32 U/L (ref 15–41)
Albumin: 3.5 g/dL (ref 3.5–5.0)
Alkaline Phosphatase: 51 U/L (ref 38–126)
Anion gap: 11 (ref 5–15)
BUN: 7 mg/dL (ref 6–20)
CO2: 17 mmol/L — ABNORMAL LOW (ref 22–32)
Calcium: 8.5 mg/dL — ABNORMAL LOW (ref 8.9–10.3)
Chloride: 111 mmol/L (ref 98–111)
Creatinine, Ser: 0.85 mg/dL (ref 0.44–1.00)
GFR, Estimated: >60 mL/min (ref >60)
Glucose, Bld: 242 mg/dL — ABNORMAL HIGH (ref 70–99)
Potassium: 4.5 mmol/L (ref 3.5–5.1)
Sodium: 139 mmol/L (ref 135–145)
Total Bilirubin: 0.8 mg/dL (ref 0.3–1.2)
Total Protein: 6.4 g/dL — ABNORMAL LOW (ref 6.5–8.1)

## 2020-01-27 LAB — LIPID PANEL
Cholesterol: 258 mg/dL — ABNORMAL HIGH (ref 0–200)
HDL: 43 mg/dL (ref >40)
LDL Cholesterol: 195 mg/dL — ABNORMAL HIGH (ref 0–99)
Total CHOL/HDL Ratio: 6 RATIO
Triglycerides: 100 mg/dL (ref <150)
VLDL: 20 mg/dL (ref 0–40)

## 2020-01-27 LAB — GLUCOSE, CAPILLARY
Glucose-Capillary: 111 mg/dL — ABNORMAL HIGH (ref 70–99)
Glucose-Capillary: 128 mg/dL — ABNORMAL HIGH (ref 70–99)

## 2020-01-27 LAB — LACTIC ACID, PLASMA: Lactic Acid, Venous: 3.9 mmol/L (ref 0.5–1.9)

## 2020-01-27 LAB — TYPE AND SCREEN
ABO/RH(D): A NEG
Antibody Screen: NEGATIVE

## 2020-01-27 MED ORDER — MENTHOL 3 MG MT LOZG
1.0000 | LOZENGE | OROMUCOSAL | Status: DC | PRN
  Administered 2020-01-27: 3 mg via ORAL
  Filled 2020-01-27: qty 9

## 2020-01-27 MED ORDER — PERFLUTREN LIPID MICROSPHERE
1.0000 mL | INTRAVENOUS | Status: AC | PRN
  Administered 2020-01-27: 2 mL via INTRAVENOUS
  Filled 2020-01-27: qty 10

## 2020-01-27 MED ORDER — IOHEXOL 350 MG/ML SOLN
60.0000 mL | Freq: Once | INTRAVENOUS | Status: AC | PRN
  Administered 2020-01-27: 60 mL via INTRAVENOUS

## 2020-01-27 NOTE — Anesthesia Postprocedure Evaluation (Signed)
Anesthesia Post Note  Patient: Matty Deamer  Procedure(s) Performed: CASE CANCELED (N/A )     Patient location during evaluation: Nursing Unit Anesthesia Type: General Level of consciousness: awake and alert and patient cooperative Pain management: pain level controlled Vital Signs Assessment: post-procedure vital signs reviewed and stable Respiratory status: spontaneous breathing and nonlabored ventilation Cardiovascular status: blood pressure returned to baseline Anesthetic complications: yes Comments: See Progress Notes   No complications documented.  Last Vitals:  Vitals:   01/27/20 0444 01/27/20 0734  BP: 127/77 121/63  Pulse: 75 79  Resp: 19 18  Temp: 36.6 C 36.7 C  SpO2: 98% 98%    Last Pain:  Vitals:   01/27/20 0734  TempSrc: Oral  PainSc:                  Johnella Crumm COKER

## 2020-01-27 NOTE — Progress Notes (Signed)
Pt has CT chest to r/o PE. CT personal requested a new PIV 20G if possible, however IVT having hard time and ended having 22G for Other IV meds. Notified DR OPYD, so CT of chest discontinued.

## 2020-01-27 NOTE — Progress Notes (Addendum)
Progress Note  Patient Name: Amber Moss Date of Encounter: 01/27/2020  [facility] Cardiologist: No primary care provider on file.   Subjective   No acute overnight events. No chest pain or shortness of breath. No recurrent bradycardic episodes.  Inpatient Medications    Scheduled Meds:  citalopram  40 mg Oral Daily   enoxaparin (LOVENOX) injection  60 mg Subcutaneous Q24H   insulin aspart  0-5 Units Subcutaneous QHS   insulin aspart  0-9 Units Subcutaneous TID WC   levothyroxine  200 mcg Oral QAC breakfast   senna-docusate  2 tablet Oral BID   Continuous Infusions:  0.9 % NaCl with KCl 40 mEq / L 75 mL/hr at 01/26/20 2317   PRN Meds: acetaminophen, menthol-cetylpyridinium, morphine injection, oxyCODONE   Vital Signs    Vitals:   01/26/20 2224 01/27/20 0040 01/27/20 0444 01/27/20 0734  BP: 136/77 124/64 127/77 121/63  Pulse: 91 85 75 79  Resp: 18 18 19 18   Temp: 98.6 F (37 C) 98.6 F (37 C) 97.8 F (36.6 C) 98 F (36.7 C)  TempSrc: Oral Oral Oral Oral  SpO2: 94% 98% 98% 98%  Weight:  131.6 kg    Height:        Intake/Output Summary (Last 24 hours) at 01/27/2020 0800 Last data filed at 01/27/2020 0057 Gross per 24 hour  Intake 800 ml  Output 700 ml  Net 100 ml   Last 3 Weights 01/27/2020 01/26/2020 01/26/2020  Weight (lbs) 290 lb 3.2 oz 290 lb 4.8 oz 280 lb  Weight (kg) 131.634 kg 131.679 kg 127.007 kg      Telemetry    Sinus rhythm with baseline rates in the 70's to 80's with a few spikes in the 100's to 110's (suspect these are with activity) - Personally Reviewed  ECG    No new ECG tracing today. - Personally Reviewed  Physical Exam   GEN: No acute distress.   Neck: Supple. Cardiac: RRR. No murmurs, rubs, or gallops.  Respiratory: Clear to auscultation bilaterally. GI: Soft, non-tender, non-distended. MS: Mild lower extremity edema on the left (chronic). No deformity. Skin: Warm and dry. Neuro:  No focal deficits. Psych: Normal  affect.  Labs    High Sensitivity Troponin:   Recent Labs  Lab 01/26/20 1539 01/26/20 1945 01/26/20 2103  TROPONINIHS 10 35* 38*      Chemistry Recent Labs  Lab 01/26/20 1220 01/26/20 1512 01/26/20 1539 01/26/20 1544 01/27/20 0053  NA 140   < > 138 137 139  K 3.8   < > 3.2* 3.1* 4.5  CL 104  --  104  --  111  CO2 24  --  20*  --  17*  GLUCOSE 115*  --  149*  --  242*  BUN 8  --  10  --  7  CREATININE 0.78  --  0.83  --  0.85  CALCIUM 9.0  --  8.5*  --  8.5*  PROT 7.1  --   --   --  6.4*  ALBUMIN 4.0  --   --   --  3.5  AST 33  --   --   --  32  ALT 29  --   --   --  27  ALKPHOS 61  --   --   --  51  BILITOT 1.0  --   --   --  0.8  GFRNONAA >60  --  >60  --  >60  ANIONGAP 12  --  14  --  11   < > = values in this interval not displayed.     Hematology Recent Labs  Lab 01/26/20 1220 01/26/20 1512 01/26/20 1539 01/26/20 1544 01/27/20 0053  WBC 6.7  --  9.0  --  8.7  RBC 4.49  --  4.37  --  4.11  HGB 13.5   < > 13.0 12.6 12.1  HCT 41.5   < > 39.7 37.0 38.6  MCV 92.4  --  90.8  --  93.9  MCH 30.1  --  29.7  --  29.4  MCHC 32.5  --  32.7  --  31.3  RDW 14.0  --  13.9  --  14.2  PLT 239  --  231  --  186   < > = values in this interval not displayed.    BNPNo results for input(s): BNP, PROBNP in the last 168 hours.   DDimer  Recent Labs  Lab 01/26/20 1945  DDIMER 1.25*     Radiology    DG CHEST PORT 1 VIEW  Result Date: 01/26/2020 CLINICAL DATA:  Status post 2 cardiac arrests prior to scheduled surgery. The patient did not have the surgery. Endotracheal tube repositioned. EXAM: PORTABLE CHEST 1 VIEW COMPARISON:  3 minutes earlier. FINDINGS: Endotracheal tube in satisfactory position. Normal sized heart. Stable previously described bilateral atelectasis. No pneumothorax. Unremarkable bones. IMPRESSION: 1. Endotracheal tube in satisfactory position. 2. Stable bilateral atelectasis. Electronically Signed   By: Claudie Revering M.D.   On: 01/26/2020 16:38    DG CHEST PORT 1 VIEW  Result Date: 01/26/2020 CLINICAL DATA:  Two episodes of cardiac arrest today prior to scheduled surgery. EXAM: PORTABLE CHEST 1 VIEW COMPARISON:  04/29/2019 FINDINGS: Endotracheal tube tip at the level of the mid clavicles projected 1.3 cm above the carina. Very poor depth of inspiration. Interval subsegmental atelectasis in the right upper lobe and minimal linear atelectasis at the lung bases. Otherwise, clear lungs with normal vascularity. No pneumothorax. Unremarkable bones. IMPRESSION: 1. Endotracheal tube tip 1.3 cm above the carina. This is a good position based on the tip at the level of the mid clavicles. 2. Very poor depth of inspiration with interval right upper lobe and minimal bibasilar atelectasis. Electronically Signed   By: Claudie Revering M.D.   On: 01/26/2020 16:37    Cardiac Studies   Echo pending.  Patient Profile     44 y.o. female with a history of spondylosis of lumbar region with lumbar herniated disk and lumbosacral radiculopathy s/p back surgery x3, fatty liver, hypothyroidism, and GERD but no cardiac disease who presented for scheduled for wound exploration on 01/26/2020 and developed severe hypoxia upon pronation which led to a brady-asystolic event requiring brief CPR and pressors. Cardiology consulted for further evaluation.  Assessment & Plan    Severe Hypoxic with Brady-Asystolic Event - Occurred after being being given anesthesia and being turned prone for wound exploration. - EKG shows no acute findings.  - High-sensitivity troponin 10 >> 35 >> 38. Not consistent with ACS. Likely demand ischemia from arrest and following CPR. - D-dimer elevated. Recommend chest CTA to rule out PE. Will defer to primary team. - Echo pending. - Potassium 2.4. Repleted and 4.6 today. - Magnesium 2.1. - No recurrent bradycardic events on telemetry. - Possibly secondary to anesthesia or neuro mediated event. Per Dr. Thompson Caul note yesterday, he has seen a  similar scenario int he last 2 months with pronation.   Shock - Briefly required Neo-Synephrine post  arrest. - BP now stable.  Hypokalemia - Potassium 2.4 yesterday. Repleted and 4.6 today.  Otherwise, per primary team. - Mild anioin gap metabolic acidosis. - Lactic acidosis. - Hypothyroidism - Migraines - Post L5-S1 decompression and fusion with severe left sided back pain and left leg numbness and weakness  For questions or updates, please contact [facility] Please consult [email or web address] for contact info under        Signed, Darreld Mclean, PA-C  01/27/2020, 8:00 AM    Patient examined chart reviewed discussed care with mother and patient. This was her 4 th back surgery and she has never had issue with anesthesia No cardiac history . Not clear if she had a reaction to anesthetic or there was an issue with her airway. Dr Linna Caprice did an excellent job. ECG is normal with No acute findings Exam is normal other than obesity and bruising on arms. Echo pending for this am From cardiac perspective can be d/c if TTE is normal No further w/u needed so long as echo is normal   Jenkins Rouge MD [facility]

## 2020-01-27 NOTE — Progress Notes (Signed)
Echo is normal  EF 60-65% No valve dx Normal RV function  No pericardial effusion   Ok to d/c from cardiology perpective  Jenkins Rouge MD [facility]

## 2020-01-27 NOTE — Progress Notes (Signed)
Anesthesiology Follow-up:  Patient awake and alert, in good spirits, neuro intact. Hemodynamically stable. No chest pain or SOB. Troponins mildly elevated C/W demand ischemia, ECHO unremarkable, now awaiting CT scan. Should be OK for discharge provided CT negative.  44 year old female developed severe bronhospam, hypoxemia and pulselessness after turned prone in OR yesterday. Cardiac workup negative so far.   Appreciate Cardiology and Hospitalist care.   Roberts Gaudy

## 2020-01-27 NOTE — Progress Notes (Signed)
  Echocardiogram 2D Echocardiogram has been performed.  Michiel Cowboy 01/27/2020, 8:51 AM

## 2020-01-27 NOTE — Progress Notes (Signed)
PT Cancellation Note  Patient Details Name: Amber Moss MRN: [id number] DOB: 19-Jan-1976   Cancelled Treatment:    Reason Eval/Treat Not Completed: Other (comment).  Nursing reports to PT that rehab will not be needed, going home now.  DC order for PT and thank you for consulting.   Ramond Dial 01/27/2020, 3:48 PM   Mee Hives, PT MS Acute Rehab Dept. Number: [place] [facility] [place]

## 2020-01-27 NOTE — Discharge Summary (Signed)
Physician Discharge Summary  [name] WVP:[id number] DOB: 08-25-1975 DOA: 01/26/2020  PCP: Rosalee Kaufman, PA-C  Admit date: 01/26/2020 Discharge date: 01/27/2020  Admitted From: Home Disposition:  Home  Recommendations for Outpatient Follow-up:  1. Follow up with PCP in 1-2 weeks 2. Please obtain BMP/CBC in one week  Discharge Condition: Stable  CODE STATUS: Full  Diet recommendation: Low fat, low salt  Brief/Interim Summary: Amber Moss is a 44 y.o. female with medical history significant for severe morbid obesity, chronic back pain post L5/S1 decompression and fusion about 2 months ago with removal of previous L4 screws.  She has been complaining of progressively worsening left-sided back pain associated with left leg numbness and weakness.  Worsened with movements.  Per neurosurgery she had a very small area of dehiscence of the wound with some mild amount of drainage which healed completely after a few weeks of oral antibiotics.  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  She presented today for wound exploration.  When patient was placed prone she desaturated abruptly with O2 saturation in the 80%.  Her ET tube was suctionned without significant improvement.  Her blood pressure also dropped significantly with SBP of 45.  She arrested.  She had chest compressions for less than a minute while epinephrine was administered and her blood pressure returned to 100s.  She required intermittent phenylephrine to maintain her blood pressure.  A right radial arterial line was obtained by Dr. Linna Caprice, anesthesia.  She was returned to the PACU intubated, monitored off sedation and vasopressors, then extubated.  She was evaluated by cardiology.  Cardiology is planning echocardiography, troponin and EKG trending and reassessment in  the morning.  Will obtain a D-dimer, ESR, CRP, lactic acid and procalcitonin.  Cardiology and anesthesia requested hospitalist admission.  Patient admitted overnight after likely severe bronchospasm after intubation just prior to neurosurgical procedure.  Patient now on room air ambulating without difficulty no further events or issues of hypoxia, hypotension.  At this time cardiology has evaluated, echocardiogram within normal limits otherwise stable from their standpoint to discharge.  Only significant lab work noted postevent was elevated D-dimer of which the CTA is unremarkable, given uneventful work-up and resolution of symptoms patient basically back to baseline at this point per family at bedside will discharge home with close follow-up with PCP as well as reevaluation with neurosurgery for rescheduling of appointment for procedure as outlined above.  Discharge Diagnoses:  Active Problems:   Post op infection   Respiratory failure with hypoxia [facility])    Discharge Instructions  Discharge Instructions    Diet - low sodium heart healthy   Complete by: As directed    Increase activity slowly   Complete by: As directed      Allergies as of 01/27/2020      Reactions   Doxycycline Hives, Diarrhea, Nausea And Vomiting   Keflex [cephalexin] Hives, Diarrhea, Nausea And Vomiting   Baclofen Other (See Comments)   Migraine   Vancomycin Itching      Medication List    STOP taking these medications   pregabalin 75 MG capsule Commonly known as: Lyrica     TAKE these medications   Aimovig 140 MG/ML Soaj Generic drug: Erenumab-aooe Inject 140 mg into the skin every 30 (thirty) days.   citalopram 40 MG tablet Commonly known as: CELEXA Take 40 mg by mouth daily.  ibuprofen 800 MG tablet Commonly known as: ADVIL Take 800 mg by mouth daily as needed for mild pain or moderate pain.   levothyroxine 50 MCG tablet Commonly known as: SYNTHROID Take 50 mcg by mouth daily before  breakfast. everyday with Synthroid 200 mg. MUST HAVE BRAND NAME What changed: Another medication with the same name was changed. Make sure you understand how and when to take each.   levothyroxine 200 MCG tablet Commonly known as: SYNTHROID Take 1 tablet (200 mcg total) by mouth daily before breakfast. What changed: additional instructions   meloxicam 15 MG tablet Commonly known as: MOBIC Take 15 mg by mouth daily as needed for pain.   pantoprazole 40 MG tablet Commonly known as: PROTONIX Take 40 mg by mouth daily as needed (reflux).   rizatriptan 10 MG tablet Commonly known as: MAXALT Take 10 mg by mouth as needed for migraine.   rOPINIRole 0.25 MG tablet Commonly known as: REQUIP Take 0.25 mg by mouth at bedtime as needed (restless leg).   Rybelsus 7 MG Tabs Generic drug: Semaglutide Take 1 tablet by mouth daily.   traMADol 50 MG tablet Commonly known as: ULTRAM Take 50 mg by mouth 3 (three) times daily as needed for moderate pain.       Allergies  Allergen Reactions  . Doxycycline Hives, Diarrhea and Nausea And Vomiting  . Keflex [Cephalexin] Hives, Diarrhea and Nausea And Vomiting  . Baclofen Other (See Comments)    Migraine  . Vancomycin Itching    Consultations:  Cardiology   Procedures/Studies: CT ANGIO CHEST PE W OR WO CONTRAST  Result Date: 01/27/2020 CLINICAL DATA:  Positive D-dimer. Bronchospasm. Hypoxia. Pulseless episode yesterday. EXAM: CT ANGIOGRAPHY CHEST WITH CONTRAST TECHNIQUE: Multidetector CT imaging of the chest was performed using the standard protocol during bolus administration of intravenous contrast. Multiplanar CT image reconstructions and MIPs were obtained to evaluate the vascular anatomy. CONTRAST:  9m OMNIPAQUE IOHEXOL 350 MG/ML SOLN COMPARISON:  Radiography 01/26/2020 FINDINGS: Cardiovascular: Pulmonary arterial opacification is good. There are no pulmonary emboli. Heart size is normal. No pericardial fluid. No coronary artery  calcification. No aortic atherosclerotic calcification. Mediastinum/Nodes: Normal Lungs/Pleura: Normal Upper Abdomen: No acute finding.  Previous cholecystectomy. Musculoskeletal: Normal Review of the MIP images confirms the above findings. IMPRESSION: Normal examination. No pulmonary emboli or other acute chest pathology. Electronically Signed   By: MNelson ChimesM.D.   On: 01/27/2020 13:49   DG CHEST PORT 1 VIEW  Result Date: 01/26/2020 CLINICAL DATA:  Status post 2 cardiac arrests prior to scheduled surgery. The patient did not have the surgery. Endotracheal tube repositioned. EXAM: PORTABLE CHEST 1 VIEW COMPARISON:  3 minutes earlier. FINDINGS: Endotracheal tube in satisfactory position. Normal sized heart. Stable previously described bilateral atelectasis. No pneumothorax. Unremarkable bones. IMPRESSION: 1. Endotracheal tube in satisfactory position. 2. Stable bilateral atelectasis. Electronically Signed   By: SClaudie ReveringM.D.   On: 01/26/2020 16:38   DG CHEST PORT 1 VIEW  Result Date: 01/26/2020 CLINICAL DATA:  Two episodes of cardiac arrest today prior to scheduled surgery. EXAM: PORTABLE CHEST 1 VIEW COMPARISON:  04/29/2019 FINDINGS: Endotracheal tube tip at the level of the mid clavicles projected 1.3 cm above the carina. Very poor depth of inspiration. Interval subsegmental atelectasis in the right upper lobe and minimal linear atelectasis at the lung bases. Otherwise, clear lungs with normal vascularity. No pneumothorax. Unremarkable bones. IMPRESSION: 1. Endotracheal tube tip 1.3 cm above the carina. This is a good position based on the tip at the  level of the mid clavicles. 2. Very poor depth of inspiration with interval right upper lobe and minimal bibasilar atelectasis. Electronically Signed   By: Claudie Revering M.D.   On: 01/26/2020 16:37   ECHOCARDIOGRAM COMPLETE  Result Date: 01/27/2020    ECHOCARDIOGRAM REPORT   Patient Name:   BRENLY TRAWICK Date of Exam: 01/27/2020 Medical Rec #:   [id number]       Height:       64.0 in Accession #:    [id number]      Weight:       290.2 lb Date of Birth:  September 21, 1975        BSA:          2.292 m Patient Age:    44 years        BP:           121/63 mmHg Patient Gender: F               HR:           88 bpm. Exam Location:  Inpatient Procedure: 2D Echo, Cardiac Doppler, Color Doppler and Intracardiac            Opacification Agent STAT ECHO Indications:    Cardiac arrest I46.9  History:        Patient has no prior history of Echocardiogram examinations.                 Risk Factors:Dyslipidemia and Non-Smoker. GERD.  Sonographer:    Vickie Epley RDCS Referring Phys: [place]  1. Left ventricular ejection fraction, by estimation, is 60 to 65%. The left ventricle has normal function. The left ventricle has no regional wall motion abnormalities. Left ventricular diastolic parameters were normal.  2. Right ventricular systolic function is normal. The right ventricular size is normal. Tricuspid regurgitation signal is inadequate for assessing PA pressure.  3. The mitral valve is grossly normal. No evidence of mitral valve regurgitation. No evidence of mitral stenosis.  4. The aortic valve is grossly normal. Aortic valve regurgitation is not visualized. No aortic stenosis is present.  5. The inferior vena cava is normal in size with greater than 50% respiratory variability, suggesting right atrial pressure of 3 mmHg. Conclusion(s)/Recommendation(s): Normal biventricular function without evidence of hemodynamically significant valvular heart disease. FINDINGS  Left Ventricle: Left ventricular ejection fraction, by estimation, is 60 to 65%. The left ventricle has normal function. The left ventricle has no regional wall motion abnormalities. Definity contrast agent was given IV to delineate the left ventricular  endocardial borders. The left ventricular internal cavity size was normal in size. There is no left ventricular hypertrophy. Left ventricular  diastolic parameters were normal. Normal left ventricular filling pressure. Right Ventricle: The right ventricular size is normal. No increase in right ventricular wall thickness. Right ventricular systolic function is normal. Tricuspid regurgitation signal is inadequate for assessing PA pressure. Left Atrium: Left atrial size was normal in size. Right Atrium: Right atrial size was normal in size. Pericardium: Trivial pericardial effusion is present. Mitral Valve: The mitral valve is grossly normal. No evidence of mitral valve regurgitation. No evidence of mitral valve stenosis. Tricuspid Valve: The tricuspid valve is grossly normal. Tricuspid valve regurgitation is trivial. No evidence of tricuspid stenosis. Aortic Valve: The aortic valve is grossly normal. Aortic valve regurgitation is not visualized. No aortic stenosis is present. Pulmonic Valve: The pulmonic valve was grossly normal. Pulmonic valve regurgitation is not visualized. No evidence of pulmonic stenosis. Aorta:  The aortic root is normal in size and structure. Venous: The inferior vena cava is normal in size with greater than 50% respiratory variability, suggesting right atrial pressure of 3 mmHg. IAS/Shunts: The atrial septum is grossly normal.  LEFT VENTRICLE PLAX 2D LVIDd:         5.00 cm      Diastology LVIDs:         3.30 cm      LV e' medial:    8.70 cm/s LV PW:         0.70 cm      LV E/e' medial:  9.8 LV IVS:        0.70 cm      LV e' lateral:   11.30 cm/s LVOT diam:     1.90 cm      LV E/e' lateral: 7.5 LV SV:         76 LV SV Index:   33 LVOT Area:     2.84 cm  LV Volumes (MOD) LV vol d, MOD A2C: 113.0 ml LV vol d, MOD A4C: 106.0 ml LV vol s, MOD A2C: 50.4 ml LV vol s, MOD A4C: 35.1 ml LV SV MOD A2C:     62.6 ml LV SV MOD A4C:     106.0 ml LV SV MOD BP:      70.6 ml RIGHT VENTRICLE RV S prime:     14.40 cm/s TAPSE (M-mode): 2.4 cm LEFT ATRIUM             Index       RIGHT ATRIUM           Index LA diam:        3.60 cm 1.57 cm/m  RA Area:      10.30 cm LA Vol (A2C):   35.5 ml 15.49 ml/m RA Volume:   18.80 ml  8.20 ml/m LA Vol (A4C):   37.8 ml 16.49 ml/m LA Biplane Vol: 37.1 ml 16.19 ml/m  AORTIC VALVE LVOT Vmax:   125.00 cm/s LVOT Vmean:  75.000 cm/s LVOT VTI:    0.268 m  AORTA Ao Root diam: 2.40 cm MITRAL VALVE MV Area (PHT): 3.23 cm    SHUNTS MV Decel Time: 235 msec    Systemic VTI:  0.27 m MV E velocity: 85.30 cm/s  Systemic Diam: 1.90 cm MV A velocity: 78.80 cm/s MV E/A ratio:  1.08 Eleonore Chiquito MD Electronically signed by Eleonore Chiquito MD Signature Date/Time: 01/27/2020/10:10:31 AM    Final      Subjective: No acute issues or events today, patient feels back to baseline denies shortness of breath, chest pain, palpitations, headache, fevers, chills.   Discharge Exam: Vitals:   01/27/20 0734 01/27/20 1144  BP: 121/63 (!) 144/75  Pulse: 79 79  Resp: 18 18  Temp: 98 F (36.7 C) 98.1 F (36.7 C)  SpO2: 98% 100%   Vitals:   01/27/20 0040 01/27/20 0444 01/27/20 0734 01/27/20 1144  BP: 124/64 127/77 121/63 (!) 144/75  Pulse: 85 75 79 79  Resp: _0 Temp: 98.6 F (37 C) 97.8 F (36.6 C) 98 F (36.7 C) 98.1 F (36.7 C)  TempSrc: Oral Oral Oral Oral  SpO2: 98% 98% 98% 100%  Weight: 131.6 kg     Height:        General: Pt is alert, awake, not in acute distress Cardiovascular: RRR, S1/S2 +, no rubs, no gallops Respiratory: CTA bilaterally, no wheezing, no rhonchi Abdominal: Soft, NT, ND,  bowel sounds + Extremities: no edema, no cyanosis    The results of significant diagnostics from this hospitalization (including imaging, microbiology, ancillary and laboratory) are listed below for reference.     Microbiology: Recent Results (from the past 240 hour(s))  SARS Coronavirus 2 by RT PCR (hospital order, performed in [facility] hospital lab) Nasopharyngeal Nasopharyngeal Swab     Status: None   Collection Time: 01/26/20 12:09 PM   Specimen: Nasopharyngeal Swab  Result Value Ref Range Status   SARS  Coronavirus 2 NEGATIVE NEGATIVE Final    Comment: (NOTE) SARS-CoV-2 target nucleic acids are NOT DETECTED.  The SARS-CoV-2 RNA is generally detectable in upper and lower respiratory specimens during the acute phase of infection. The lowest concentration of SARS-CoV-2 viral copies this assay can detect is 250 copies / mL. A negative result does not preclude SARS-CoV-2 infection and should not be used as the sole basis for treatment or other patient management decisions.  A negative result may occur with improper specimen collection / handling, submission of specimen other than nasopharyngeal swab, presence of viral mutation(s) within the areas targeted by this assay, and inadequate number of viral copies (<250 copies / mL). A negative result must be combined with clinical observations, patient history, and epidemiological information.  Fact Sheet for Patients:   StrictlyIdeas.no  Fact Sheet for Healthcare Providers: BankingDealers.co.za  This test is not yet approved or  cleared by the [place] FDA and has been authorized for detection and/or diagnosis of SARS-CoV-2 by FDA under an Emergency Use Authorization (EUA).  This EUA will remain in effect (meaning this test can be used) for the duration of the COVID-19 declaration under Section 564(b)(1) of the Act, 21 U.S.C. section 360bbb-3(b)(1), unless the authorization is terminated or revoked sooner.  Performed at [facility], [place] [address] [place], [place] [address]   Surgical pcr screen     Status: None   Collection Time: 01/26/20 12:19 PM   Specimen: Nasal Mucosa; Nasal Swab  Result Value Ref Range Status   MRSA, PCR NEGATIVE NEGATIVE Final   Staphylococcus aureus NEGATIVE NEGATIVE Final    Comment: (NOTE) The Xpert SA Assay (FDA approved for NASAL specimens in patients 39 years of age and older), is one component of a comprehensive surveillance program. It is not  intended to diagnose infection nor to guide or monitor treatment. Performed at [facility], [place] [address] [place], [place] [address]      Labs: BNP (last 3 results) No results for input(s): BNP in the last 8760 hours. Basic Metabolic Panel: Recent Labs  Lab 01/26/20 1220 01/26/20 1512 01/26/20 1539 01/26/20 1544 01/26/20 1945 01/27/20 0053  NA 140 142 138 137  --  139  K 3.8 2.4* 3.2* 3.1*  --  4.5  CL 104  --  104  --   --  111  CO2 24  --  20*  --   --  17*  GLUCOSE 115*  --  149*  --   --  242*  BUN 8  --  10  --   --  7  CREATININE 0.78  --  0.83  --   --  0.85  CALCIUM 9.0  --  8.5*  --   --  8.5*  MG  --   --   --   --  2.1  --    Liver Function Tests: Recent Labs  Lab 01/26/20 1220 01/27/20 0053  AST 33 32  ALT  29 27  ALKPHOS 61 51  BILITOT 1.0 0.8  PROT 7.1 6.4*  ALBUMIN 4.0 3.5   No results for input(s): LIPASE, AMYLASE in the last 168 hours. No results for input(s): AMMONIA in the last 168 hours. CBC: Recent Labs  Lab 01/26/20 1220 01/26/20 1512 01/26/20 1539 01/26/20 1544 01/27/20 0053  WBC 6.7  --  9.0  --  8.7  NEUTROABS  --   --   --   --  7.5  HGB 13.5 10.5* 13.0 12.6 12.1  HCT 41.5 31.0* 39.7 37.0 38.6  MCV 92.4  --  90.8  --  93.9  PLT 239  --  231  --  186   Cardiac Enzymes: No results for input(s): CKTOTAL, CKMB, CKMBINDEX, TROPONINI in the last 168 hours. BNP: Invalid input(s): POCBNP CBG: Recent Labs  Lab 01/26/20 2115 01/27/20 0622 01/27/20 1143  GLUCAP 152* 111* 128*   D-Dimer Recent Labs    01/26/20 1945  DDIMER 1.25*   Hgb A1c Recent Labs    01/26/20 1945  HGBA1C 5.7*   Lipid Profile Recent Labs    01/27/20 0438  CHOL 258*  HDL 43  LDLCALC 195*  TRIG 100  CHOLHDL 6.0   Thyroid function studies Recent Labs    01/26/20 1945  TSH 2.313   Anemia work up No results for input(s): VITAMINB12, FOLATE, FERRITIN, TIBC, IRON, RETICCTPCT in the last 72 hours. Urinalysis No results found for:  COLORURINE, APPEARANCEUR, [place], [place], [place], [place], [place], [place], PROTEINUR, UROBILINOGEN, NITRITE, LEUKOCYTESUR Sepsis Labs Invalid input(s): PROCALCITONIN,  WBC,  LACTICIDVEN Microbiology Recent Results (from the past 240 hour(s))  SARS Coronavirus 2 by RT PCR (hospital order, performed in [facility] hospital lab) Nasopharyngeal Nasopharyngeal Swab     Status: None   Collection Time: 01/26/20 12:09 PM   Specimen: Nasopharyngeal Swab  Result Value Ref Range Status   SARS Coronavirus 2 NEGATIVE NEGATIVE Final    Comment: (NOTE) SARS-CoV-2 target nucleic acids are NOT DETECTED.  The SARS-CoV-2 RNA is generally detectable in upper and lower respiratory specimens during the acute phase of infection. The lowest concentration of SARS-CoV-2 viral copies this assay can detect is 250 copies / mL. A negative result does not preclude SARS-CoV-2 infection and should not be used as the sole basis for treatment or other patient management decisions.  A negative result may occur with improper specimen collection / handling, submission of specimen other than nasopharyngeal swab, presence of viral mutation(s) within the areas targeted by this assay, and inadequate number of viral copies (<250 copies / mL). A negative result must be combined with clinical observations, patient history, and epidemiological information.  Fact Sheet for Patients:   StrictlyIdeas.no  Fact Sheet for Healthcare Providers: BankingDealers.co.za  This test is not yet approved or  cleared by the [place] FDA and has been authorized for detection and/or diagnosis of SARS-CoV-2 by FDA under an Emergency Use Authorization (EUA).  This EUA will remain in effect (meaning this test can be used) for the duration of the COVID-19 declaration under Section 564(b)(1) of the Act, 21 U.S.C. section 360bbb-3(b)(1), unless the authorization is terminated or revoked  sooner.  Performed at [facility], [place] [address], [place], [place] [address]   Surgical pcr screen     Status: None   Collection Time: 01/26/20 12:19 PM   Specimen: Nasal Mucosa; Nasal Swab  Result Value Ref Range Status   MRSA, PCR NEGATIVE NEGATIVE Final   Staphylococcus aureus NEGATIVE NEGATIVE Final  Comment: (NOTE) The Xpert SA Assay (FDA approved for NASAL specimens in patients 56 years of age and older), is one component of a comprehensive surveillance program. It is not intended to diagnose infection nor to guide or monitor treatment. Performed at [facility], [place] [address], [place], [place] [address]      Time coordinating discharge: Over 30 minutes  SIGNED:   Little Ishikawa, DO [facility] 01/27/2020, 3:01 PM Pager   If 7PM-7AM, please contact night-coverage [email or web address]

## 2020-01-27 NOTE — Progress Notes (Signed)
PT Cancellation Note  Patient Details Name: Amber Moss MRN: [id number] DOB: Jun 14, 1975   Cancelled Treatment:    Reason Eval/Treat Not Completed: Medical issues which prohibited therapy.  Pt will be screened for clearance of DVT, PE, nursing asking PT to hold until afternoon.  Retry then.   Ramond Dial 01/27/2020, 11:25 AM   Mee Hives, PT MS Acute Rehab Dept. Number: [place] [facility] [place]

## 2020-02-04 DIAGNOSIS — J9691 Respiratory failure, unspecified with hypoxia: Secondary | ICD-10-CM | POA: Diagnosis not present

## 2020-02-04 DIAGNOSIS — T8140XA Infection following a procedure, unspecified, initial encounter: Secondary | ICD-10-CM | POA: Diagnosis not present

## 2020-02-06 ENCOUNTER — Other Ambulatory Visit: Payer: Self-pay | Admitting: Neurosurgery

## 2020-02-08 ENCOUNTER — Encounter (HOSPITAL_COMMUNITY): Payer: Self-pay | Admitting: Neurosurgery

## 2020-02-08 MED ORDER — VANCOMYCIN HCL 1500 MG/300ML IV SOLN
1500.0000 mg | INTRAVENOUS | Status: AC
  Administered 2020-02-09: 1500 mg via INTRAVENOUS
  Filled 2020-02-08 (×2): qty 300

## 2020-02-08 MED ORDER — VANCOMYCIN HCL 1500 MG/300ML IV SOLN
1500.0000 mg | INTRAVENOUS | Status: DC
  Filled 2020-02-08: qty 300

## 2020-02-08 NOTE — Progress Notes (Signed)
PCP - Clemmie Krill, PA-C Cardiologist - Dr Johnsie Cancel Neurology- Dr Sarina Ill  Chest x-ray - 01/26/20 (1V) EKG - 01/27/20 Stress Test - n/a ECHO - 01/27/20-Normal  Cardiac Cath - n/a  STOP now taking any Aspirin (unless otherwise instructed by your surgeon), Aleve, Naproxen, Ibuprofen, Motrin, Advil, Goody's, BC's, all herbal medications, fish oil, and all vitamins.   Patient takes rybelsis for weight loss.  Patient is not a diabetic.  Anesthesia - Yes Ebony Hail 01/25/20 Note, surgery not completed r/t severe bronchospasm.  See Note 01/27/20.  Surgery rescheduled.   Coronavirus Screening Covid test on DOS Do you have any of the following symptoms:  Cough yes/no: No Fever (>100.74F)  yes/no: No Runny nose yes/no: No Sore throat yes/no: No Difficulty breathing/shortness of breath  yes/no: No  Have you traveled in the last 14 days and where? yes/no: No  Patient verbalized understanding of instructions that were given via phone.

## 2020-02-09 ENCOUNTER — Encounter (HOSPITAL_COMMUNITY)
Admission: AD | Disposition: A | Discharge status: Home / Self Care | Payer: Self-pay | Source: Home / Self Care | Attending: Neurosurgery

## 2020-02-09 ENCOUNTER — Ambulatory Visit (HOSPITAL_COMMUNITY): Payer: BC Managed Care – PPO | Admitting: Certified Registered"

## 2020-02-09 ENCOUNTER — Ambulatory Visit (HOSPITAL_COMMUNITY): Payer: BC Managed Care – PPO

## 2020-02-09 ENCOUNTER — Encounter (HOSPITAL_COMMUNITY): Payer: Self-pay | Admitting: Neurosurgery

## 2020-02-09 ENCOUNTER — Other Ambulatory Visit: Payer: Self-pay

## 2020-02-09 ENCOUNTER — Inpatient Hospital Stay (HOSPITAL_COMMUNITY)
Admission: AD | Admit: 2020-02-09 | Discharge: 2020-02-10 | DRG: 517 | Disposition: A | Discharge status: Home / Self Care | Payer: BC Managed Care – PPO | Attending: Neurosurgery | Admitting: Neurosurgery

## 2020-02-09 DIAGNOSIS — Y838 Other surgical procedures as the cause of abnormal reaction of the patient, or of later complication, without mention of misadventure at the time of the procedure: Secondary | ICD-10-CM | POA: Diagnosis present

## 2020-02-09 DIAGNOSIS — J9691 Respiratory failure, unspecified with hypoxia: Secondary | ICD-10-CM | POA: Diagnosis not present

## 2020-02-09 DIAGNOSIS — Z79899 Other long term (current) drug therapy: Secondary | ICD-10-CM | POA: Diagnosis not present

## 2020-02-09 DIAGNOSIS — M5117 Intervertebral disc disorders with radiculopathy, lumbosacral region: Secondary | ICD-10-CM | POA: Diagnosis not present

## 2020-02-09 DIAGNOSIS — Z20822 Contact with and (suspected) exposure to covid-19: Secondary | ICD-10-CM | POA: Diagnosis present

## 2020-02-09 DIAGNOSIS — K219 Gastro-esophageal reflux disease without esophagitis: Secondary | ICD-10-CM | POA: Diagnosis present

## 2020-02-09 DIAGNOSIS — Z9049 Acquired absence of other specified parts of digestive tract: Secondary | ICD-10-CM | POA: Diagnosis not present

## 2020-02-09 DIAGNOSIS — T84028A Dislocation of other internal joint prosthesis, initial encounter: Principal | ICD-10-CM | POA: Diagnosis present

## 2020-02-09 DIAGNOSIS — Z79890 Hormone replacement therapy (postmenopausal): Secondary | ICD-10-CM | POA: Diagnosis not present

## 2020-02-09 DIAGNOSIS — Z880 Allergy status to penicillin: Secondary | ICD-10-CM

## 2020-02-09 DIAGNOSIS — T8463XA Infection and inflammatory reaction due to internal fixation device of spine, initial encounter: Secondary | ICD-10-CM | POA: Diagnosis not present

## 2020-02-09 DIAGNOSIS — E039 Hypothyroidism, unspecified: Secondary | ICD-10-CM | POA: Diagnosis not present

## 2020-02-09 DIAGNOSIS — Z8249 Family history of ischemic heart disease and other diseases of the circulatory system: Secondary | ICD-10-CM

## 2020-02-09 DIAGNOSIS — M5416 Radiculopathy, lumbar region: Secondary | ICD-10-CM | POA: Diagnosis present

## 2020-02-09 DIAGNOSIS — F32A Depression, unspecified: Secondary | ICD-10-CM | POA: Diagnosis present

## 2020-02-09 DIAGNOSIS — G43909 Migraine, unspecified, not intractable, without status migrainosus: Secondary | ICD-10-CM | POA: Diagnosis not present

## 2020-02-09 DIAGNOSIS — M4807 Spinal stenosis, lumbosacral region: Secondary | ICD-10-CM | POA: Diagnosis present

## 2020-02-09 DIAGNOSIS — E7800 Pure hypercholesterolemia, unspecified: Secondary | ICD-10-CM | POA: Diagnosis not present

## 2020-02-09 DIAGNOSIS — T84226A Displacement of internal fixation device of vertebrae, initial encounter: Secondary | ICD-10-CM | POA: Diagnosis not present

## 2020-02-09 DIAGNOSIS — M4306 Spondylolysis, lumbar region: Secondary | ICD-10-CM | POA: Diagnosis not present

## 2020-02-09 DIAGNOSIS — Z419 Encounter for procedure for purposes other than remedying health state, unspecified: Secondary | ICD-10-CM

## 2020-02-09 LAB — POCT I-STAT, CHEM 8
BUN: 10 mg/dL (ref 6–20)
Calcium, Ion: 1.21 mmol/L (ref 1.15–1.40)
Chloride: 104 mmol/L (ref 98–111)
Creatinine, Ser: 0.6 mg/dL (ref 0.44–1.00)
Glucose, Bld: 186 mg/dL — ABNORMAL HIGH (ref 70–99)
HCT: 31 % — ABNORMAL LOW (ref 36.0–46.0)
Hemoglobin: 10.5 g/dL — ABNORMAL LOW (ref 12.0–15.0)
Potassium: 3.8 mmol/L (ref 3.5–5.1)
Sodium: 139 mmol/L (ref 135–145)
TCO2: 22 mmol/L (ref 22–32)

## 2020-02-09 LAB — CBC
HCT: 39.4 % (ref 36.0–46.0)
Hemoglobin: 13.2 g/dL (ref 12.0–15.0)
MCH: 30.3 pg (ref 26.0–34.0)
MCHC: 33.5 g/dL (ref 30.0–36.0)
MCV: 90.4 fL (ref 80.0–100.0)
Platelets: 231 10*3/uL (ref 150–400)
RBC: 4.36 MIL/uL (ref 3.87–5.11)
RDW: 13.8 % (ref 11.5–15.5)
WBC: 8.1 10*3/uL (ref 4.0–10.5)
nRBC: 0 % (ref 0.0–0.2)

## 2020-02-09 LAB — POCT I-STAT 7, (LYTES, BLD GAS, ICA,H+H)
Acid-Base Excess: 1 mmol/L (ref 0.0–2.0)
Bicarbonate: 26.9 mmol/L (ref 20.0–28.0)
Calcium, Ion: 1.19 mmol/L (ref 1.15–1.40)
HCT: 27 % — ABNORMAL LOW (ref 36.0–46.0)
Hemoglobin: 9.2 g/dL — ABNORMAL LOW (ref 12.0–15.0)
O2 Saturation: 100 %
Potassium: 3.8 mmol/L (ref 3.5–5.1)
Sodium: 139 mmol/L (ref 135–145)
TCO2: 28 mmol/L (ref 22–32)
pCO2 arterial: 48.8 mmHg — ABNORMAL HIGH (ref 32.0–48.0)
pH, Arterial: 7.35 (ref 7.350–7.450)
pO2, Arterial: 299 mmHg — ABNORMAL HIGH (ref 83.0–108.0)

## 2020-02-09 LAB — BASIC METABOLIC PANEL
Anion gap: 13 (ref 5–15)
BUN: 6 mg/dL (ref 6–20)
CO2: 21 mmol/L — ABNORMAL LOW (ref 22–32)
Calcium: 9.2 mg/dL (ref 8.9–10.3)
Chloride: 105 mmol/L (ref 98–111)
Creatinine, Ser: 0.74 mg/dL (ref 0.44–1.00)
GFR, Estimated: >60 mL/min (ref >60)
Glucose, Bld: 108 mg/dL — ABNORMAL HIGH (ref 70–99)
Potassium: 4.1 mmol/L (ref 3.5–5.1)
Sodium: 139 mmol/L (ref 135–145)

## 2020-02-09 LAB — SARS CORONAVIRUS 2 BY RT PCR (HOSPITAL ORDER, PERFORMED IN ~~LOC~~ HOSPITAL LAB): SARS Coronavirus 2: NEGATIVE

## 2020-02-09 LAB — POCT PREGNANCY, URINE: Preg Test, Ur: NEGATIVE

## 2020-02-09 SURGERY — POSTERIOR LUMBAR FUSION 1 LEVEL
Anesthesia: General | Site: Back

## 2020-02-09 MED ORDER — CHLORHEXIDINE GLUCONATE 0.12 % MT SOLN
15.0000 mL | OROMUCOSAL | Status: AC
  Administered 2020-02-09: 15 mL via OROMUCOSAL
  Filled 2020-02-09: qty 15

## 2020-02-09 MED ORDER — THROMBIN 5000 UNITS EX SOLR
OROMUCOSAL | Status: DC | PRN
  Administered 2020-02-09: 5 mL via TOPICAL

## 2020-02-09 MED ORDER — SENNA 8.6 MG PO TABS
1.0000 | ORAL_TABLET | Freq: Two times a day (BID) | ORAL | Status: DC
  Administered 2020-02-09: 8.6 mg via ORAL
  Filled 2020-02-09: qty 1

## 2020-02-09 MED ORDER — PHENOL 1.4 % MT LIQD: 1.0000 | OROMUCOSAL | Status: DC | PRN

## 2020-02-09 MED ORDER — SODIUM CHLORIDE 0.9% FLUSH: 3.0000 mL | INTRAVENOUS | Status: DC | PRN

## 2020-02-09 MED ORDER — PHENYLEPHRINE 40 MCG/ML (10ML) SYRINGE FOR IV PUSH (FOR BLOOD PRESSURE SUPPORT)
PREFILLED_SYRINGE | INTRAVENOUS | Status: AC
  Filled 2020-02-09: qty 10

## 2020-02-09 MED ORDER — LIDOCAINE-EPINEPHRINE 1 %-1:100000 IJ SOLN
INTRAMUSCULAR | Status: AC
  Filled 2020-02-09: qty 1

## 2020-02-09 MED ORDER — ONDANSETRON HCL 4 MG/2ML IJ SOLN: 4.0000 mg | Freq: Four times a day (QID) | INTRAMUSCULAR | Status: DC | PRN

## 2020-02-09 MED ORDER — PROPOFOL 10 MG/ML IV BOLUS
INTRAVENOUS | Status: AC
  Filled 2020-02-09: qty 20

## 2020-02-09 MED ORDER — LACTATED RINGERS IV SOLN: INTRAVENOUS | Status: DC

## 2020-02-09 MED ORDER — FENTANYL CITRATE (PF) 250 MCG/5ML IJ SOLN
INTRAMUSCULAR | Status: DC | PRN
  Administered 2020-02-09 (×2): 50 ug via INTRAVENOUS
  Administered 2020-02-09: 200 ug via INTRAVENOUS
  Administered 2020-02-09 (×2): 50 ug via INTRAVENOUS

## 2020-02-09 MED ORDER — ACETAMINOPHEN 650 MG RE SUPP: 650.0000 mg | RECTAL | Status: DC | PRN

## 2020-02-09 MED ORDER — CHLORHEXIDINE GLUCONATE CLOTH 2 % EX PADS: 6.0000 | MEDICATED_PAD | Freq: Once | CUTANEOUS | Status: DC

## 2020-02-09 MED ORDER — GABAPENTIN 300 MG PO CAPS
300.0000 mg | ORAL_CAPSULE | Freq: Three times a day (TID) | ORAL | Status: DC
  Administered 2020-02-09: 300 mg via ORAL
  Filled 2020-02-09: qty 1

## 2020-02-09 MED ORDER — ONDANSETRON HCL 4 MG PO TABS
4.0000 mg | ORAL_TABLET | Freq: Four times a day (QID) | ORAL | Status: DC | PRN
  Administered 2020-02-10: 4 mg via ORAL
  Filled 2020-02-09: qty 1

## 2020-02-09 MED ORDER — SUMATRIPTAN SUCCINATE 50 MG PO TABS
50.0000 mg | ORAL_TABLET | ORAL | Status: DC | PRN
  Filled 2020-02-09: qty 1

## 2020-02-09 MED ORDER — OXYCODONE HCL 5 MG PO TABS
ORAL_TABLET | ORAL | Status: AC
  Filled 2020-02-09: qty 1

## 2020-02-09 MED ORDER — SUGAMMADEX SODIUM 500 MG/5ML IV SOLN
INTRAVENOUS | Status: AC
  Filled 2020-02-09: qty 5

## 2020-02-09 MED ORDER — METHOCARBAMOL 500 MG PO TABS
500.0000 mg | ORAL_TABLET | Freq: Four times a day (QID) | ORAL | Status: DC | PRN
  Administered 2020-02-09: 500 mg via ORAL
  Filled 2020-02-09: qty 1

## 2020-02-09 MED ORDER — MENTHOL 3 MG MT LOZG
LOZENGE | OROMUCOSAL | Status: AC
  Filled 2020-02-09: qty 9

## 2020-02-09 MED ORDER — ROPINIROLE HCL 0.25 MG PO TABS
0.2500 mg | ORAL_TABLET | Freq: Every evening | ORAL | Status: DC | PRN
  Filled 2020-02-09: qty 1

## 2020-02-09 MED ORDER — FENTANYL CITRATE (PF) 250 MCG/5ML IJ SOLN
INTRAMUSCULAR | Status: AC
  Filled 2020-02-09: qty 5

## 2020-02-09 MED ORDER — SODIUM CHLORIDE 0.9 % IV SOLN: 250.0000 mL | INTRAVENOUS | Status: DC

## 2020-02-09 MED ORDER — LIDOCAINE 2% (20 MG/ML) 5 ML SYRINGE
INTRAMUSCULAR | Status: DC | PRN
  Administered 2020-02-09: 60 mg via INTRAVENOUS

## 2020-02-09 MED ORDER — ROCURONIUM BROMIDE 10 MG/ML (PF) SYRINGE
PREFILLED_SYRINGE | INTRAVENOUS | Status: DC | PRN
  Administered 2020-02-09 (×2): 10 mg via INTRAVENOUS
  Administered 2020-02-09: 50 mg via INTRAVENOUS

## 2020-02-09 MED ORDER — BACITRACIN ZINC 500 UNIT/GM EX OINT
TOPICAL_OINTMENT | CUTANEOUS | Status: AC
  Filled 2020-02-09: qty 28.35

## 2020-02-09 MED ORDER — BISACODYL 10 MG RE SUPP: 10.0000 mg | Freq: Every day | RECTAL | Status: DC | PRN

## 2020-02-09 MED ORDER — SODIUM CHLORIDE 0.9% FLUSH: 3.0000 mL | Freq: Two times a day (BID) | INTRAVENOUS | Status: DC

## 2020-02-09 MED ORDER — BUPIVACAINE HCL (PF) 0.5 % IJ SOLN
INTRAMUSCULAR | Status: AC
  Filled 2020-02-09: qty 30

## 2020-02-09 MED ORDER — VANCOMYCIN HCL 1250 MG/250ML IV SOLN
1250.0000 mg | Freq: Once | INTRAVENOUS | Status: AC
  Administered 2020-02-10: 1250 mg via INTRAVENOUS
  Filled 2020-02-09: qty 250

## 2020-02-09 MED ORDER — FLEET ENEMA 7-19 GM/118ML RE ENEM: 1.0000 | ENEMA | Freq: Once | RECTAL | Status: DC | PRN

## 2020-02-09 MED ORDER — SUGAMMADEX SODIUM 200 MG/2ML IV SOLN
INTRAVENOUS | Status: DC | PRN
  Administered 2020-02-09: 300 mg via INTRAVENOUS

## 2020-02-09 MED ORDER — ACETAMINOPHEN 325 MG PO TABS: 650.0000 mg | ORAL_TABLET | ORAL | Status: DC | PRN

## 2020-02-09 MED ORDER — LIDOCAINE HCL (PF) 2 % IJ SOLN
INTRAMUSCULAR | Status: AC
  Filled 2020-02-09: qty 5

## 2020-02-09 MED ORDER — ACETAMINOPHEN 500 MG PO TABS
1000.0000 mg | ORAL_TABLET | Freq: Four times a day (QID) | ORAL | Status: DC
  Administered 2020-02-09 – 2020-02-10 (×2): 1000 mg via ORAL
  Filled 2020-02-09 (×2): qty 2

## 2020-02-09 MED ORDER — FENTANYL CITRATE (PF) 100 MCG/2ML IJ SOLN
INTRAMUSCULAR | Status: AC
  Filled 2020-02-09: qty 2

## 2020-02-09 MED ORDER — DOCUSATE SODIUM 100 MG PO CAPS
100.0000 mg | ORAL_CAPSULE | Freq: Two times a day (BID) | ORAL | Status: DC
  Administered 2020-02-09: 100 mg via ORAL
  Filled 2020-02-09: qty 1

## 2020-02-09 MED ORDER — METOPROLOL TARTRATE 5 MG/5ML IV SOLN
INTRAVENOUS | Status: DC | PRN
  Administered 2020-02-09: 2 mg via INTRAVENOUS

## 2020-02-09 MED ORDER — BACITRACIN ZINC 500 UNIT/GM EX OINT
TOPICAL_OINTMENT | CUTANEOUS | Status: DC | PRN
  Administered 2020-02-09: 1 via TOPICAL

## 2020-02-09 MED ORDER — THROMBIN 5000 UNITS EX SOLR
CUTANEOUS | Status: AC
  Filled 2020-02-09: qty 5000

## 2020-02-09 MED ORDER — OXYCODONE HCL 5 MG PO TABS
5.0000 mg | ORAL_TABLET | Freq: Once | ORAL | Status: AC | PRN
  Administered 2020-02-09: 5 mg via ORAL

## 2020-02-09 MED ORDER — CITALOPRAM HYDROBROMIDE 20 MG PO TABS: 40.0000 mg | ORAL_TABLET | Freq: Every day | ORAL | Status: DC

## 2020-02-09 MED ORDER — ROCURONIUM BROMIDE 10 MG/ML (PF) SYRINGE
PREFILLED_SYRINGE | INTRAVENOUS | Status: AC
  Filled 2020-02-09: qty 10

## 2020-02-09 MED ORDER — MIDAZOLAM HCL 2 MG/2ML IJ SOLN
INTRAMUSCULAR | Status: DC | PRN
  Administered 2020-02-09: 2 mg via INTRAVENOUS

## 2020-02-09 MED ORDER — PHENYLEPHRINE 40 MCG/ML (10ML) SYRINGE FOR IV PUSH (FOR BLOOD PRESSURE SUPPORT)
PREFILLED_SYRINGE | INTRAVENOUS | Status: DC | PRN
  Administered 2020-02-09: 120 ug via INTRAVENOUS
  Administered 2020-02-09 (×2): 80 ug via INTRAVENOUS

## 2020-02-09 MED ORDER — LEVOTHYROXINE SODIUM 100 MCG PO TABS: 200.0000 ug | ORAL_TABLET | Freq: Every day | ORAL | Status: DC

## 2020-02-09 MED ORDER — MIDAZOLAM HCL 2 MG/2ML IJ SOLN
INTRAMUSCULAR | Status: AC
  Filled 2020-02-09: qty 2

## 2020-02-09 MED ORDER — DEXMEDETOMIDINE (PRECEDEX) IN NS 20 MCG/5ML (4 MCG/ML) IV SYRINGE
PREFILLED_SYRINGE | INTRAVENOUS | Status: DC | PRN
  Administered 2020-02-09 (×2): 4 ug via INTRAVENOUS
  Administered 2020-02-09: 8 ug via INTRAVENOUS
  Administered 2020-02-09: 4 ug via INTRAVENOUS

## 2020-02-09 MED ORDER — METOPROLOL TARTRATE 5 MG/5ML IV SOLN
INTRAVENOUS | Status: AC
  Filled 2020-02-09: qty 5

## 2020-02-09 MED ORDER — PROPOFOL 10 MG/ML IV BOLUS
INTRAVENOUS | Status: DC | PRN
  Administered 2020-02-09 (×2): 50 mg via INTRAVENOUS
  Administered 2020-02-09: 200 mg via INTRAVENOUS

## 2020-02-09 MED ORDER — HYDROCODONE-ACETAMINOPHEN 5-325 MG PO TABS: 1.0000 | ORAL_TABLET | ORAL | Status: DC | PRN

## 2020-02-09 MED ORDER — METHOCARBAMOL 1000 MG/10ML IJ SOLN
500.0000 mg | Freq: Four times a day (QID) | INTRAVENOUS | Status: DC | PRN
  Filled 2020-02-09: qty 5

## 2020-02-09 MED ORDER — SENNOSIDES-DOCUSATE SODIUM 8.6-50 MG PO TABS: 1.0000 | ORAL_TABLET | Freq: Every evening | ORAL | Status: DC | PRN

## 2020-02-09 MED ORDER — PHENYLEPHRINE HCL-NACL 10-0.9 MG/250ML-% IV SOLN
INTRAVENOUS | Status: DC | PRN
  Administered 2020-02-09: 40 ug/min via INTRAVENOUS

## 2020-02-09 MED ORDER — OXYCODONE HCL 5 MG/5ML PO SOLN: 5.0000 mg | Freq: Once | ORAL | Status: AC | PRN

## 2020-02-09 MED ORDER — LEVOTHYROXINE SODIUM 100 MCG PO TABS: 300.0000 ug | ORAL_TABLET | Freq: Every day | ORAL | Status: DC

## 2020-02-09 MED ORDER — FENTANYL CITRATE (PF) 100 MCG/2ML IJ SOLN
25.0000 ug | INTRAMUSCULAR | Status: DC | PRN
  Administered 2020-02-09 (×2): 50 ug via INTRAVENOUS

## 2020-02-09 MED ORDER — DEXAMETHASONE SODIUM PHOSPHATE 10 MG/ML IJ SOLN
INTRAMUSCULAR | Status: DC | PRN
  Administered 2020-02-09 (×2): 10 mg via INTRAVENOUS

## 2020-02-09 MED ORDER — MENTHOL 3 MG MT LOZG
1.0000 | LOZENGE | OROMUCOSAL | Status: DC | PRN
  Filled 2020-02-09: qty 9

## 2020-02-09 MED ORDER — PANTOPRAZOLE SODIUM 40 MG PO TBEC: 40.0000 mg | DELAYED_RELEASE_TABLET | Freq: Every day | ORAL | Status: DC | PRN

## 2020-02-09 MED ORDER — ONDANSETRON HCL 4 MG/2ML IJ SOLN
INTRAMUSCULAR | Status: DC | PRN
  Administered 2020-02-09: 4 mg via INTRAVENOUS

## 2020-02-09 MED ORDER — SODIUM CHLORIDE 0.9 % IV SOLN: INTRAVENOUS | Status: DC

## 2020-02-09 MED ORDER — HYDROMORPHONE HCL 1 MG/ML IJ SOLN: 0.5000 mg | INTRAMUSCULAR | Status: DC | PRN

## 2020-02-09 MED ORDER — LEVOTHYROXINE SODIUM 100 MCG PO TABS: 100.0000 ug | ORAL_TABLET | Freq: Every day | ORAL | Status: DC

## 2020-02-09 MED ORDER — OXYCODONE HCL 5 MG PO TABS
5.0000 mg | ORAL_TABLET | ORAL | Status: DC | PRN
  Administered 2020-02-09 – 2020-02-10 (×2): 5 mg via ORAL
  Administered 2020-02-10: 10 mg via ORAL
  Filled 2020-02-09 (×2): qty 1
  Filled 2020-02-09: qty 2

## 2020-02-09 MED ORDER — SEMAGLUTIDE 7 MG PO TABS: 7.0000 mg | ORAL_TABLET | Freq: Every day | ORAL | Status: DC

## 2020-02-09 MED ORDER — 0.9 % SODIUM CHLORIDE (POUR BTL) OPTIME
TOPICAL | Status: DC | PRN
  Administered 2020-02-09: 1000 mL

## 2020-02-09 SURGICAL SUPPLY — 66 items
BAND RUBBER #18 3X1/16 STRL (MISCELLANEOUS) ×4 IMPLANT
BASKET BONE COLLECTION (BASKET) IMPLANT
BENZOIN TINCTURE PRP APPL 2/3 (GAUZE/BANDAGES/DRESSINGS) IMPLANT
BLADE CLIPPER SURG (BLADE) IMPLANT
BLADE SURG 11 STRL SS (BLADE) IMPLANT
BUR MATCHSTICK NEURO 3.0 LAGG (BURR) IMPLANT
BUR PRECISION FLUTE 5.0 (BURR) IMPLANT
CANISTER SUCT 3000ML PPV (MISCELLANEOUS) ×2 IMPLANT
CARTRIDGE OIL MAESTRO DRILL (MISCELLANEOUS) IMPLANT
CNTNR URN SCR LID CUP LEK RST (MISCELLANEOUS) ×1 IMPLANT
CONT SPEC 4OZ STRL OR WHT (MISCELLANEOUS) ×1
COVER BACK TABLE 60X90IN (DRAPES) IMPLANT
COVER WAND RF STERILE (DRAPES) ×2 IMPLANT
DECANTER SPIKE VIAL GLASS SM (MISCELLANEOUS) ×2 IMPLANT
DERMABOND ADVANCED (GAUZE/BANDAGES/DRESSINGS) ×1
DERMABOND ADVANCED .7 DNX12 (GAUZE/BANDAGES/DRESSINGS) ×1 IMPLANT
DIFFUSER DRILL AIR PNEUMATIC (MISCELLANEOUS) IMPLANT
DRAPE C-ARM 42X72 X-RAY (DRAPES) ×4 IMPLANT
DRAPE C-ARMOR (DRAPES) ×2 IMPLANT
DRAPE LAPAROTOMY 100X72X124 (DRAPES) ×2 IMPLANT
DRAPE MICROSCOPE LEICA (MISCELLANEOUS) ×2 IMPLANT
DRAPE SURG 17X23 STRL (DRAPES) ×2 IMPLANT
DRSG OPSITE POSTOP 4X6 (GAUZE/BANDAGES/DRESSINGS) ×2 IMPLANT
DURAPREP 26ML APPLICATOR (WOUND CARE) ×2 IMPLANT
ELECT REM PT RETURN 9FT ADLT (ELECTROSURGICAL)
ELECTRODE REM PT RTRN 9FT ADLT (ELECTROSURGICAL) IMPLANT
GAUZE 4X4 16PLY RFD (DISPOSABLE) IMPLANT
GAUZE SPONGE 4X4 12PLY STRL (GAUZE/BANDAGES/DRESSINGS) IMPLANT
GLOVE BIO SURGEON STRL SZ7.5 (GLOVE) ×2 IMPLANT
GLOVE BIOGEL PI IND STRL 6.5 (GLOVE) ×1 IMPLANT
GLOVE BIOGEL PI IND STRL 7.0 (GLOVE) ×1 IMPLANT
GLOVE BIOGEL PI IND STRL 7.5 (GLOVE) ×2 IMPLANT
GLOVE BIOGEL PI INDICATOR 6.5 (GLOVE) ×1
GLOVE BIOGEL PI INDICATOR 7.0 (GLOVE) ×1
GLOVE BIOGEL PI INDICATOR 7.5 (GLOVE) ×2
GLOVE ECLIPSE 7.0 STRL STRAW (GLOVE) ×2 IMPLANT
GLOVE EXAM NITRILE XL STR (GLOVE) IMPLANT
GLOVE SURG SS PI 6.0 STRL IVOR (GLOVE) ×6 IMPLANT
GOWN STRL REUS W/ TWL LRG LVL3 (GOWN DISPOSABLE) ×4 IMPLANT
GOWN STRL REUS W/ TWL XL LVL3 (GOWN DISPOSABLE) IMPLANT
GOWN STRL REUS W/TWL 2XL LVL3 (GOWN DISPOSABLE) IMPLANT
GOWN STRL REUS W/TWL LRG LVL3 (GOWN DISPOSABLE) ×4
GOWN STRL REUS W/TWL XL LVL3 (GOWN DISPOSABLE)
HEMOSTAT POWDER KIT SURGIFOAM (HEMOSTASIS) ×2 IMPLANT
KIT BASIN OR (CUSTOM PROCEDURE TRAY) ×2 IMPLANT
KIT POSITION SURG JACKSON T1 (MISCELLANEOUS) ×2 IMPLANT
KIT TURNOVER KIT B (KITS) ×2 IMPLANT
MILL MEDIUM DISP (BLADE) IMPLANT
NEEDLE HYPO 18GX1.5 BLUNT FILL (NEEDLE) IMPLANT
NEEDLE HYPO 22GX1.5 SAFETY (NEEDLE) ×2 IMPLANT
NEEDLE SPNL 18GX3.5 QUINCKE PK (NEEDLE) IMPLANT
NS IRRIG 1000ML POUR BTL (IV SOLUTION) ×2 IMPLANT
OIL CARTRIDGE MAESTRO DRILL (MISCELLANEOUS)
PACK LAMINECTOMY NEURO (CUSTOM PROCEDURE TRAY) ×2 IMPLANT
PAD ARMBOARD 7.5X6 YLW CONV (MISCELLANEOUS) ×6 IMPLANT
SPONGE LAP 4X18 RFD (DISPOSABLE) ×2 IMPLANT
SPONGE SURGIFOAM ABS GEL 100 (HEMOSTASIS) IMPLANT
STRIP CLOSURE SKIN 1/2X4 (GAUZE/BANDAGES/DRESSINGS) IMPLANT
SUT VIC AB 0 CT1 18XCR BRD8 (SUTURE) ×3 IMPLANT
SUT VIC AB 0 CT1 8-18 (SUTURE) ×3
SUT VICRYL 3-0 RB1 18 ABS (SUTURE) ×2 IMPLANT
SYR 3ML LL SCALE MARK (SYRINGE) IMPLANT
TOWEL GREEN STERILE (TOWEL DISPOSABLE) ×2 IMPLANT
TOWEL GREEN STERILE FF (TOWEL DISPOSABLE) ×2 IMPLANT
TRAY FOLEY MTR SLVR 16FR STAT (SET/KITS/TRAYS/PACK) IMPLANT
WATER STERILE IRR 1000ML POUR (IV SOLUTION) ×2 IMPLANT

## 2020-02-09 NOTE — Anesthesia Procedure Notes (Signed)
Arterial Line Insertion Start/End12/04/2019 4:06 AM, 02/09/2020 4:15 AM Performed by: Dorthea Cove, CRNA, CRNA  Patient location: Pre-op. Preanesthetic checklist: patient identified, IV checked, site marked, risks and benefits discussed, surgical consent, monitors and equipment checked, pre-op evaluation, timeout performed and anesthesia consent Lidocaine 1% used for infiltration Right, radial was placed Catheter size: 20 Fr Hand hygiene performed  and maximum sterile barriers used   Attempts: 1 Procedure performed without using ultrasound guided technique. Following insertion, dressing applied and Biopatch. Post procedure assessment: normal and unchanged  Patient tolerated the procedure well with no immediate complications.

## 2020-02-09 NOTE — Op Note (Signed)
NEUROSURGERY OPERATIVE NOTE   PREOP DIAGNOSIS:  1. Lumbago 2. Subfascial fluid collection 3. Hardware malposition   POSTOP DIAGNOSIS: Same  PROCEDURE: 1. Wound exploration, revision of left L5-S1 cage position  SURGEON: Dr. Consuella Lose, MD  ASSISTANT: Ferne Reus, PA-C  ANESTHESIA: General Endotracheal  EBL: 250cc  SPECIMENS: None  DRAINS: None  COMPLICATIONS: None immediate  CONDITION: Hemodynamically stable to PACU  HISTORY: Amber Moss is a 44 y.o. female who underwent L5-S1 decompression and fusion with removal of previous L4 pedicle screws about 2 months ago.  She initially did well, with significant improvement in her preoperative pain.  Unfortunately over the last several weeks she has been complaining of low back and left-sided leg pain and subjective weakness.  She did undergo further imaging including MRI scan which demonstrated a fluid collection behind the thecal sac at L5-S1.  Unclear if this was compressive or not.  In addition, she underwent CT scan of the lumbar spine demonstrating good interbody fusion at L4-5, but possible slightly posteriorly situated left-sided L5-S1 cage with a portion within the lateral aspect of the foramen.  With the patient's symptoms and radiographic findings, we elected to proceed with surgical exploration of her wound and possible revision of the cage.  We attempted the surgery approximately 2 weeks ago however she became relatively unstable from a cardiorespiratory standpoint and the surgery was aborted.  She underwent extensive work-up, all essentially unremarkable.  She was clinically well.  We therefore brought her back in to proceed with surgery.  The risks, benefits, and alternatives to the surgery were reviewed in detail with the patient in the office.  After all her questions were answered informed consent was obtained and witnessed.  PROCEDURE IN DETAIL: The patient was brought to the operating room. After  induction of general anesthesia, the patient was positioned on the operative table in the prone position. All pressure points were meticulously padded.  Previous skin incision was then marked out and prepped and draped in the usual sterile fashion.  After timeout was conducted, the previous incision was opened sharply.  Bovie was used to dissect through the subcutaneous tissue.  There was a significant amount of scar tissue beneath the subcutaneous fat.  No significant fluid collection was identified above the fascia.  Once the spinous process of L3 was identified, dissection was carried slightly inferiorly and laterally.  Initially, the L5 screws were identified bilaterally, followed by the S1 screws.  There was a small amount of primarily serous fluid surrounding the L5-S1 level.  This was a minimal amount of fluid, and did not appear to be cerebrospinal fluid.  There was a significant amount of fibrosis surrounding the thecal sac.  No active CSF drainage was seen.  At this point, dissection was carried out along the left S1 pedicle screw to identify the superior articulating process.  I was then able to identify the medial and superior border of the S1 pedicle.  Dissection was then carried out just lateral to the thecal sac and the disc space was identified as was the left-sided L5-S1 cage.  Further dissection was carried slightly superiorly to identify the L5 pedicle on the right.  At this point, I was able to place a bone tamp on the L5-S1 cage.  The cage was then tapped further into the disc space.  Fluoroscopic image was taken before and after tapping the cage and and we did note several millimeters of anterior migration of the cage.  At this point we performed  a Valsalva maneuver.  Again, no CSF drainage was seen.  The wound was irrigated with normal saline irrigation.  Hemostasis was secured with morselized Gelfoam with thrombin as well as bipolar electrocautery.  Self-retaining retractors were then  removed.  Wound was then closed in multiple layers using a combination of interrupted 0 Vicryl stitches.  Skin was closed with staples.  Bacitracin ointment and sterile dressing was applied.  At the end of the case all sponge, needle, instrument, and cottonoid counts were correct.  Patient was then transferred to the stretcher, extubated, and taken to the postanesthesia care unit in stable hemodynamic condition.

## 2020-02-09 NOTE — Anesthesia Procedure Notes (Signed)
Procedure Name: Intubation Date/Time: 02/09/2020 4:26 PM Performed by: Lance Coon, CRNA Pre-anesthesia Checklist: Patient identified, Emergency Drugs available, Suction available, Patient being monitored and Timeout performed Patient Re-evaluated:Patient Re-evaluated prior to induction Oxygen Delivery Method: Circle system utilized Preoxygenation: Pre-oxygenation with 100% oxygen Induction Type: IV induction Ventilation: Mask ventilation without difficulty Laryngoscope Size: Miller and 2 Grade View: Grade I Tube type: Oral Tube size: 7.0 mm Number of attempts: 1 Airway Equipment and Method: Stylet Placement Confirmation: ETT inserted through vocal cords under direct vision,  positive ETCO2 and breath sounds checked- equal and bilateral Secured at: 20 cm Tube secured with: Tape Dental Injury: Teeth and Oropharynx as per pre-operative assessment

## 2020-02-09 NOTE — Progress Notes (Signed)
Pharmacy Antibiotic Note  Amber Moss is a 44 y.o. female admitted on 02/09/2020 with progressively worsening left-sided back pain with left leg numbness and weakness after she underwent  L5-S1 decompression and fusion about 2 months ago.  Admitted for wound exploratation.  She is s/p wound exploration, revision of left L5-S1 cage position 02/09/20  Pharmacy has been consulted for Vancomycin dosing x 1 for surgical prophylaxis.   No drain noted. WBC 8.1, afeb, SCr 0.74 Vanc 1500mg  preop given at ~16:30 on 02/09/20. Weight 131.5 kg   Plan: Vancomycin 1250 mg IV  x1 , give 12 hr afte pre op dose.  Pharmacy will sign off. Re-consult pharmacy if further medication assistance needed.   Height: 5\' 4"  (162.6 cm) Weight: 131.5 kg (290 lb) IBW/kg (Calculated) : 54.7  Temp (24hrs), Avg:98.3 F (36.8 C), Min:98 F (36.7 C), Max:98.6 F (37 C)  Recent Labs  Lab 02/09/20 0807 02/09/20 1309  WBC  --  8.1  CREATININE 0.60 0.74    Estimated Creatinine Clearance: 122.2 mL/min (by C-G formula based on SCr of 0.74 mg/dL).    Allergies  Allergen Reactions  . Doxycycline Hives, Diarrhea and Nausea And Vomiting  . Keflex [Cephalexin] Hives, Diarrhea and Nausea And Vomiting  . Baclofen Other (See Comments)    Migraine  . Vancomycin Itching    Thank you for allowing pharmacy to be a part of this patient's care. Nicole Cella, RPh Clinical Pharmacist  Please check AMION for all [facility] [place] phone numbers After 10:00 PM, call [place] [phone]  02/09/2020 8:43 PM

## 2020-02-09 NOTE — H&P (Signed)
Chief Complaint   No chief complaint on file.   HPI   HPI: Amber Moss is a 44 y.o. female  who underwent L5-S1 decompression and fusion about 2 months ago with removal of previous L4 screws.  While she did well for about a week postoperatively, she has been complaining of progressively worsening left-sided back pain with left leg numbness and weakness.  Pain appears to be primarily mechanical in nature, worsened with any kind of movement.  She also notes relatively severe subjective left leg numbness and weakness.  Initially, she did have a very small area of dehiscence of her wound with some mild amount of drainage.  This healed completely after a few weeks of oral antibiotics.  Because of continued pain an MRI of her lumbar spine was ordered revealing apparent residual stenosis at L5-S1 from postoperative fluid collection, unclear whether this represents infection. Over the last 2 weeks she has experienced relatively severe HA and nausea when sitting or standing upright, relieved when supine. No fluid leak from her wound.   We attempted wound exploration a few weeks ago complicated by significant cardiopulmonary instability when turned prone requiring [place] to abort the procedure. She underwent cardiac and pulmonary workup prior to her discharge which was unrevealing of any pathology. She presents today again for wound exploration.   Patient Active Problem List   Diagnosis Date Noted  . Post op infection 01/26/2020  . Respiratory failure with hypoxia ([place]) 01/26/2020  . Lumbosacral radiculopathy at S1 10/06/2019  . 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 HNP (herniated nucleus pulposus), lumbar 10/22/2017  . Lumbar herniated disc 10/22/2017    PMH: Past Medical History:  Diagnosis Date  . Arthritis    back  . Depression   . Easy bruising   . Fatty liver   . GERD (gastroesophageal reflux disease)   . Hypercholesterolemia    diet controlled, no med  . Hypothyroidism    . Migraines   . Polycystic ovarian disease   . PONV (postoperative nausea and vomiting)     PSH: Past Surgical History:  Procedure Laterality Date  . ABLATION     cervical ablation at MD's Office  . BACK SURGERY  12/2017, 11/22/19  . CARPAL TUNNEL RELEASE Bilateral   . CHOLECYSTECTOMY  04/2007   Dr. Lindalou Hose  . DILATION AND CURETTAGE OF UTERUS  12/2011  . EYE SURGERY Bilateral    Lasik  . LUMBAR LAMINECTOMY/DECOMPRESSION MICRODISCECTOMY Left 10/22/2017   Procedure: LUMBAR - FIVE LAMINOTOMY AND MICRODISCECTOMY;  Surgeon: Consuella Lose, MD;  Location: [facility];  Service: Neurosurgery;  Laterality: Left;   LUMBAR - FIVE LAMINOTOMY AND MICRODISCECTOMY    No medications prior to admission.    SH: Social History   Tobacco Use  . Smoking status: Never Smoker  . Smokeless tobacco: Never Used  Vaping Use  . Vaping Use: Never used  Substance Use Topics  . Alcohol use: Never  . Drug use: Never    MEDS: Prior to Admission medications   Medication Sig Start Date End Date Taking? Authorizing Provider  citalopram (CELEXA) 40 MG tablet Take 40 mg by mouth daily. 10/20/18  Yes [provider]  Erenumab-aooe (AIMOVIG) 140 MG/ML SOAJ Inject 140 mg into the skin every 28 (twenty-eight) days.    Yes [provider]  ibuprofen (ADVIL) 800 MG tablet Take 800 mg by mouth daily as needed for mild pain or moderate pain.  12/01/18  Yes [provider]  levothyroxine (SYNTHROID) 200 MCG tablet  Take 1 tablet (200 mcg total) by mouth daily before breakfast. Patient taking differently: Take 200 mcg by mouth daily before breakfast. MUST HAVE BRAND NAME . 12/24/18  Yes Shamleffer, Melanie Crazier, MD  levothyroxine (SYNTHROID) 50 MCG tablet Take 100 mcg by mouth daily before breakfast. everyday with Synthroid 200 mg. MUST HAVE BRAND NAME   Yes [provider]  meloxicam (MOBIC) 15 MG tablet Take 15 mg by mouth daily as needed for pain.    Yes [provider]   pantoprazole (PROTONIX) 40 MG tablet Take 40 mg by mouth daily as needed (reflux).    Yes [provider]  rizatriptan (MAXALT) 10 MG tablet Take 10 mg by mouth as needed for migraine.  07/12/18  Yes [provider]  rOPINIRole (REQUIP) 0.25 MG tablet Take 0.25 mg by mouth at bedtime as needed (restless leg).    Yes [provider]  traMADol (ULTRAM) 50 MG tablet Take 50 mg by mouth 3 (three) times daily as needed for moderate pain.    Yes [provider]  RYBELSUS 7 MG TABS Take 7 mg by mouth daily.  01/23/20   [provider]  SYNTHROID 300 MCG tablet Take 300 mcg by mouth daily before breakfast. 02/05/20   [provider]    ALLERGY: Allergies  Allergen Reactions  . Doxycycline Hives, Diarrhea and Nausea And Vomiting  . Keflex [Cephalexin] Hives, Diarrhea and Nausea And Vomiting  . Baclofen Other (See Comments)    Migraine  . Vancomycin Itching    Social History   Tobacco Use  . Smoking status: Never Smoker  . Smokeless tobacco: Never Used  Substance Use Topics  . Alcohol use: Never     Family History  Problem Relation Age of Onset  . Thyroid disease Maternal Grandfather   . Hypertension Mother   . Hyperlipidemia Father   . Hypertension Father   . Diabetes Mellitus II Father      ROS   ROS  Exam   There were no vitals filed for this visit. General appearance: WDWN, NAD Eyes: No scleral injection Cardiovascular: Regular rate and rhythm without murmurs, rubs, gallops. No edema or variciosities. Distal pulses normal. Pulmonary: Effort normal, non-labored breathing Musculoskeletal:     Muscle tone upper extremities: Normal    Muscle tone lower extremities: Normal    Motor exam: Upper Extremities Deltoid Bicep Tricep Grip  Right 5/5 5/5 5/5 5/5  Left 5/5 5/5 5/5 5/5   Lower Extremity IP Quad PF DF EHL  Right 5/5 5/5 5/5 5/5 5/5  Left 5/5 5/5 5/5 5/5 5/5   Neurological Mental Status:    - Patient is awake,  alert, oriented to person, place, month, year, and situation    - Patient is able to give a clear and coherent history.    - No signs of aphasia or neglect Cranial Nerves    - II: Visual Fields are full. PERRL    - III/IV/VI: EOMI without ptosis or diploplia.     - V: Facial sensation is grossly normal    - VII: Facial movement is symmetric.     - VIII: hearing is intact to voice    - X: Uvula elevates symmetrically    - XI: Shoulder shrug is symmetric.    - XII: tongue is midline without atrophy or fasciculations.  Sensory: Sensation grossly intact to LT  Results - Imaging/Labs   No results found for this or any previous visit (from the past 48 hour(s)).  No results found.  IMAGING: MRI of the lumbar spine was personally reviewed.  This demonstrates significant amount of hardware artifact at the L5-S1 level.  There appears to be some susceptibility artifact ventral to the thecal sac at the L5-S1 interspace, unclear if this is posterior displacement of the cage verses hardware related artifact.  There does appear to be a fluid collection dorsal to the thecal sac.  Again, because of the artifact it is difficult to adequately interpret however there does appear to be significant residual spinal stenosis at this level.  There is minimal hyperintensity on FLAIR signal of the L5 and S1 endplates.  AP and lateral lumbar spine x-rays done in the office today were also personally reviewed.  These appear to demonstrate stable position of the implanted hardware.  On these x-rays, the L5-S1 interbody cages do not appear to be posteriorly displaced.  CT SCAN: Good interbody fusion at L4-5. Left L5-S1 cage is slightly posterior perhaps 1-38mm within the foramen. Otherwise no hardware complications. Screws appear well seated without halo.  Impression/Plan   44 y.o. female two months status post L5-S1 decompression and fusion with severe left-sided mechanical back pain and left leg weakness.  She does  have postoperative fluid collection with apparent significant stenosis at L5-S1.  More recent symptoms are suggestive of delayed CSF leak. We will proceed with operative exploration of the wound, based on CT unlikely to need hardware revision.  We have reviewed the indications for surgery, the associated risks, benefits and alternatives at length in the office.  All questions today were answered and consent was obtained.   Consuella Lose, MD [facility]

## 2020-02-09 NOTE — Transfer of Care (Signed)
Immediate Anesthesia Transfer of Care Note  Patient: Amber Moss  Procedure(s) Performed: WOUND EXPLORATION;HARDWARE REVISION Lumbar five-Sacral one (N/A Back)  Patient Location: PACU  Anesthesia Type:General  Level of Consciousness: awake, alert  and oriented  Airway & Oxygen Therapy: Patient Spontanous Breathing and Patient connected to face mask oxygen  Post-op Assessment: Report given to RN and Post -op Vital signs reviewed and stable  Post vital signs: Reviewed and stable  Last Vitals:  Vitals Value Taken Time  BP 136/90 02/09/20 1839  Temp    Pulse 97 02/09/20 1840  Resp 18 02/09/20 1840  SpO2 97 % 02/09/20 1840  Vitals shown include unvalidated device data.  Last Pain:  Vitals:   02/09/20 1252  TempSrc:   PainSc: 6       Patients Stated Pain Goal: 3 (04/88/89 1694)  Complications: No complications documented.

## 2020-02-09 NOTE — Anesthesia Preprocedure Evaluation (Signed)
Anesthesia Evaluation  Patient identified by MRN, date of birth, ID band Patient awake    Reviewed: Allergy & Precautions, H&P , NPO status , Patient's Chart, lab work & pertinent test results  History of Anesthesia Complications (+) PONV and history of anesthetic complications  Airway Mallampati: II   Neck ROM: full    Dental   Pulmonary    breath sounds clear to auscultation       Cardiovascular negative cardio ROS   Rhythm:regular Rate:Normal     Neuro/Psych  Headaches, PSYCHIATRIC DISORDERS Depression  Neuromuscular disease    GI/Hepatic GERD  ,  Endo/Other  Hypothyroidism   Renal/GU      Musculoskeletal  (+) Arthritis ,   Abdominal   Peds  Hematology   Anesthesia Other Findings   Reproductive/Obstetrics                             Anesthesia Physical Anesthesia Plan  ASA: II  Anesthesia Plan: General   Post-op Pain Management:    Induction: Intravenous  PONV Risk Score and Plan: 4 or greater and Ondansetron, Dexamethasone, Midazolam and Treatment may vary due to age or medical condition  Airway Management Planned: Oral ETT  Additional Equipment: Arterial line  Intra-op Plan:   Post-operative Plan: Extubation in OR  Informed Consent: I have reviewed the patients History and Physical, chart, labs and discussed the procedure including the risks, benefits and alternatives for the proposed anesthesia with the patient or authorized representative who has indicated his/her understanding and acceptance.       Plan Discussed with: CRNA, Anesthesiologist and Surgeon  Anesthesia Plan Comments:         Anesthesia Quick Evaluation

## 2020-02-10 LAB — CBC
HCT: 35.2 % — ABNORMAL LOW (ref 36.0–46.0)
Hemoglobin: 12.1 g/dL (ref 12.0–15.0)
MCH: 30.6 pg (ref 26.0–34.0)
MCHC: 34.4 g/dL (ref 30.0–36.0)
MCV: 88.9 fL (ref 80.0–100.0)
Platelets: 241 10*3/uL (ref 150–400)
RBC: 3.96 MIL/uL (ref 3.87–5.11)
RDW: 13.6 % (ref 11.5–15.5)
WBC: 12.4 10*3/uL — ABNORMAL HIGH (ref 4.0–10.5)
nRBC: 0 % (ref 0.0–0.2)

## 2020-02-10 LAB — BASIC METABOLIC PANEL
Anion gap: 11 (ref 5–15)
BUN: 6 mg/dL (ref 6–20)
CO2: 22 mmol/L (ref 22–32)
Calcium: 8.9 mg/dL (ref 8.9–10.3)
Chloride: 103 mmol/L (ref 98–111)
Creatinine, Ser: 0.86 mg/dL (ref 0.44–1.00)
GFR, Estimated: >60 mL/min (ref >60)
Glucose, Bld: 197 mg/dL — ABNORMAL HIGH (ref 70–99)
Potassium: 4.4 mmol/L (ref 3.5–5.1)
Sodium: 136 mmol/L (ref 135–145)

## 2020-02-10 LAB — PROTIME-INR
INR: 1 (ref 0.8–1.2)
Prothrombin Time: 12.8 seconds (ref 11.4–15.2)

## 2020-02-10 LAB — APTT: aPTT: 23 seconds — ABNORMAL LOW (ref 24–36)

## 2020-02-10 MED ORDER — HYDROCODONE-ACETAMINOPHEN 5-325 MG PO TABS: 1.0000 | ORAL_TABLET | ORAL | 0 refills | Status: DC | PRN

## 2020-02-10 MED ORDER — METAXALONE 800 MG PO TABS: 800.0000 mg | ORAL_TABLET | Freq: Three times a day (TID) | ORAL | 1 refills | Status: DC

## 2020-02-10 MED ORDER — METAXALONE 800 MG PO TABS
800.0000 mg | ORAL_TABLET | Freq: Once | ORAL | Status: AC
  Administered 2020-02-10: 800 mg via ORAL
  Filled 2020-02-10: qty 1

## 2020-02-10 NOTE — Evaluation (Signed)
Occupational Therapy Evaluation Patient Details Name: Amber Moss MRN: [id number] DOB: May 04, 1975 Today's Date: 02/10/2020    History of Present Illness 44 y.o. female presenting with progressively worsening L-sided back pain with L leg numbness and weakness s/p wound exploration and revision of L L5-S1 cage position on 12/2. PMHx significant for multiple back surgeries including recent L5-S1 decompression and fusion 01/2020, depression, OA, hypothyroidism, and migrains.    Clinical Impression   PTA patient was independent with ADLs/IADLs with the exception of needing assist for tub/shower transfers. Patient currently presents at baseline level of function demonstrating bed mobility, ADL transfers, and functional mobility without use of AE and Mod I. Patient also able to recall 3/3 back precautions with good carryover during functional tasks. Patient reports assist upon d/c home from family. Patient does not require continued acute occupational therapy services with OT to sign off at this time. Recommendation for return home with assist from family as needed.     Follow Up Recommendations  No OT follow up    Equipment Recommendations  None recommended by OT    Recommendations for Other Services       Precautions / Restrictions Precautions Precautions: Back;Fall Precaution Booklet Issued: Yes (comment) Precaution Comments: Provided handout and reviewed precautions Required Braces or Orthoses: Spinal Brace Spinal Brace: Lumbar corset;Applied in sitting position Restrictions Weight Bearing Restrictions: No      Mobility Bed Mobility Overal bed mobility: Modified Independent             General bed mobility comments: Patient with good carryover of log rollin technique from PT session.     Transfers Overall transfer level: Modified independent Equipment used: None             General transfer comment: Stood from EOB x1, no difficulties    Balance Overall  balance assessment: No apparent balance deficits (not formally assessed)                                         ADL either performed or assessed with clinical judgement   ADL Overall ADL's : Needs assistance/impaired     Grooming: Wash/dry hands;Independent;Standing Grooming Details (indicate cue type and reason): Hand washing at sink level without AD.          Upper Body Dressing : Set up;Sitting   Lower Body Dressing: Modified independent;Sit to/from stand Lower Body Dressing Details (indicate cue type and reason): Patient reports use of compensatory technique to don LB clothing. Patient reports ability to complete without assist.  Toilet Transfer: Modified Independent Toilet Transfer Details (indicate cue type and reason): Mod I for transfer to low commode with use of grab bar.          Functional mobility during ADLs: Modified independent General ADL Comments: Without AD.      Vision   Vision Assessment?: No apparent visual deficits     Perception     Praxis      Pertinent Vitals/Pain Pain Assessment: 0-10 Pain Score: 1  Pain Location: Incision Pain Descriptors / Indicators: Grimacing;Guarding Pain Intervention(s): Monitored during session;Premedicated before session;Repositioned;Ice applied     Hand Dominance Right   Extremity/Trunk Assessment Upper Extremity Assessment Upper Extremity Assessment: Overall WFL for tasks assessed   Lower Extremity Assessment Lower Extremity Assessment: Defer to PT evaluation   Cervical / Trunk Assessment Cervical / Trunk Assessment: Other exceptions Cervical / Trunk Exceptions: s/p  back surgery   Communication Communication Communication: No difficulties   Cognition Arousal/Alertness: Awake/alert Behavior During Therapy: WFL for tasks assessed/performed Overall Cognitive Status: Within Functional Limits for tasks assessed                                     General Comments   Bandage with some mild weeping.    Exercises     Shoulder Instructions      Home Living Family/patient expects to be discharged to:: Private residence Living Arrangements: Spouse/significant other;Children (2 kids, 61 and 67 y/o) Available Help at Discharge: Family;Available PRN/intermittently Type of Home: House Home Access: Stairs to enter [employer] of Steps: 3 Entrance Stairs-Rails: None Home Layout: Two level;Laundry or work area in basement     [employer] Shower/Tub: Teacher, early years/pre: [place]: [employer] - single point          Prior Functioning/Environment Level of Independence: Independent        Comments: Independent with ADLs, gets some assist with getting in/out of the shower. Does some cleaning. 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        OT Problem List: Pain      OT Treatment/Interventions:      OT Goals(Current goals can be found in the care plan section) Acute Rehab OT Goals Patient Stated Goal: to go home and get back to work  OT Frequency:     Barriers to D/C:            Co-evaluation              AM-PAC OT "6 Clicks" Daily Activity     Outcome Measure Help from another person eating meals?: None Help from another person taking care of personal grooming?: None Help from another person toileting, which includes using toliet, bedpan, or urinal?: None Help from another person bathing (including washing, rinsing, drying)?: None Help from another person to put on and taking off regular upper body clothing?: None Help from another person to put on and taking off regular lower body clothing?: A Little 6 Click Score: 23   End of Session    Activity Tolerance: Patient tolerated treatment well Patient left: in bed;with call bell/phone within reach;with family/visitor present  OT Visit Diagnosis: Pain Pain - part of body:  (Incision)                Time: 8676-1950 OT Time  Calculation (min): 9 min Charges:  OT General Charges $OT Visit: 1 Visit OT Evaluation $OT Eval Low Complexity: 1 Low  Finley Chevez H. OTR/L Supplemental OT, Department of rehab services [phone]  Deijah Spikes R H. 02/10/2020, 9:48 AM

## 2020-02-10 NOTE — Progress Notes (Signed)
  NEUROSURGERY PROGRESS NOTE   No issues overnight.  Pre op sx resolved Eager for d/c home  EXAM:  BP 128/77 (BP Location: Left Arm)   Pulse 96   Temp 98.1 F (36.7 C) (Oral)   Resp 18   Ht 5\' 4"  (1.626 m)   Wt 131.5 kg   LMP  (LMP Unknown)   SpO2 100%   BMI 49.78 kg/m   Awake, alert, oriented  Speech fluent, appropriate  CN grossly intact  5/5 BUE/BLE  Incision: c/d/i  IMPRESSION/PLAN 44 y.o. female POD1 wound exploration. Doing well. Discharge home

## 2020-02-10 NOTE — Discharge Summary (Signed)
Physician Discharge Summary  Patient ID: Amber Moss MRN: [id number] DOB/AGE: 43/03/1975 43 y.o.  Admit date: 02/09/2020 Discharge date: 02/10/2020  Admission Diagnoses:  Back pain Unspecified postop fluid collection   Discharge Diagnoses:  Same Active Problems:   Lumbar radiculopathy   Discharged Condition: Stable  Hospital Course:  Amber Moss is a 44 y.o. female who was admitted for the below procedure. There were no post operative complications. At time of discharge, pain was well controlled, ambulating with Pt/OT, tolerating po, voiding normal. Ready for discharge.  Treatments: Surgery - wound exploration, unspecified fluid collection, likely seroma  Discharge Exam: Blood pressure 128/77, pulse 96, temperature 98.1 F (36.7 C), temperature source Oral, resp. rate 18, height 5\' 4"  (1.626 m), weight 131.5 kg, SpO2 100 %. Awake, alert, oriented Speech fluent, appropriate CN grossly intact 5/5 BUE/BLE Wound c/d/i  Disposition: Discharge disposition: 01-Home or Self Care       Discharge Instructions    Call MD for:  difficulty breathing, headache or visual disturbances   Complete by: As directed    Call MD for:  persistant dizziness or light-headedness   Complete by: As directed    Call MD for:  redness, tenderness, or signs of infection (pain, swelling, redness, odor or green/yellow discharge around incision site)   Complete by: As directed    Call MD for:  severe uncontrolled pain   Complete by: As directed    Call MD for:  temperature >100.4   Complete by: As directed    Diet - low sodium heart healthy   Complete by: As directed    Driving Restrictions   Complete by: As directed    Do not drive until given clearance.   Increase activity slowly   Complete by: As directed    Lifting restrictions   Complete by: As directed    Do not lift anything >10lbs. Avoid bending and twisting in awkward positions. Avoid bending at the back.   May  shower / Bathe   Complete by: As directed    In 24 hours. Okay to wash wound with warm soapy water. Avoid scrubbing the wound. Pat dry.   Remove dressing in 48 hours   Complete by: As directed      Allergies as of 02/10/2020      Reactions   Doxycycline Hives, Diarrhea, Nausea And Vomiting   Keflex [cephalexin] Hives, Diarrhea, Nausea And Vomiting   Baclofen Other (See Comments)   Migraine   Vancomycin Itching      Medication List    TAKE these medications   Aimovig 140 MG/ML Soaj Generic drug: Erenumab-aooe Inject 140 mg into the skin every 28 (twenty-eight) days.   citalopram 40 MG tablet Commonly known as: CELEXA Take 40 mg by mouth daily.   HYDROcodone-acetaminophen 5-325 MG tablet Commonly known as: NORCO/VICODIN Take 1 tablet by mouth every 4 (four) hours as needed for moderate pain ((score 4 to 6)).   ibuprofen 800 MG tablet Commonly known as: ADVIL Take 800 mg by mouth daily as needed for mild pain or moderate pain.   levothyroxine 50 MCG tablet Commonly known as: SYNTHROID Take 100 mcg by mouth daily before breakfast. everyday with Synthroid 200 mg. MUST HAVE BRAND NAME What changed: Another medication with the same name was changed. Make sure you understand how and when to take each.   levothyroxine 200 MCG tablet Commonly known as: SYNTHROID Take 1 tablet (200 mcg total) by mouth daily before breakfast. What changed: additional instructions  Synthroid 300 MCG tablet Generic drug: levothyroxine Take 300 mcg by mouth daily before breakfast. What changed: Another medication with the same name was changed. Make sure you understand how and when to take each.   meloxicam 15 MG tablet Commonly known as: MOBIC Take 15 mg by mouth daily as needed for pain.   metaxalone 800 MG tablet Commonly known as: Skelaxin Take 1 tablet (800 mg total) by mouth 3 (three) times daily.   pantoprazole 40 MG tablet Commonly known as: PROTONIX Take 40 mg by mouth daily as  needed (reflux).   rizatriptan 10 MG tablet Commonly known as: MAXALT Take 10 mg by mouth as needed for migraine.   rOPINIRole 0.25 MG tablet Commonly known as: REQUIP Take 0.25 mg by mouth at bedtime as needed (restless leg).   Rybelsus 7 MG Tabs Generic drug: Semaglutide Take 7 mg by mouth daily.   traMADol 50 MG tablet Commonly known as: ULTRAM Take 50 mg by mouth 3 (three) times daily as needed for moderate pain.       Follow-up Information    Consuella Lose, MD. Schedule an appointment as soon as possible for a visit in 3 week(s).   Specialty: Neurosurgery Contact information: [address] [place] [address] [phone]               Signed: Traci Sermon 02/10/2020, 9:06 AM

## 2020-02-10 NOTE — Evaluation (Signed)
Physical Therapy Evaluation Patient Details Name: Winola Drum MRN: [id number] DOB: 01-27-1976 Today's Date: 02/10/2020   History of Present Illness  44 y.o. female presenting with progressively worsening L-sided back pain with L leg numbness and weakness s/p wound exploration and revision of L L5-S1 cage position on 12/2. PMHx significant for multiple back surgeries including recent L5-S1 decompression and fusion 01/2020, depression, OA, hypothyroidism, and migrains.   Clinical Impression  Patient presents with post surgical deficits s/p above. Pt reports using SPC as needed for ambulation PTA and was able to do her own ADLs but needed assist with stairs and getting in/out of the tub. Lives at home with her spouse and 2 children, ages 43 and 21. Today, pt tolerated bed mobility, transfers, gait training, stair training with Mod I for safety. Reports no pain and eager to return to work. Education re: handout, back precautions, positioning, log roll technique, walking program etc. All education completed. Pt does not require skilled therapy services. Discharge from therapy/    Follow Up Recommendations No PT follow up    Equipment Recommendations  None recommended by PT    Recommendations for Other Services       Precautions / Restrictions Precautions Precautions: Back;Fall Precaution Booklet Issued: Yes (comment) Precaution Comments: Provided handout and reviewed precautions Restrictions Weight Bearing Restrictions: No      Mobility  Bed Mobility Overal bed mobility: Modified Independent             General bed mobility comments: Cues for log roll technique.    Transfers Overall transfer level: Modified independent Equipment used: None             General transfer comment: Stood from EOB x1, no difficulties  Ambulation/Gait Ambulation/Gait assistance: Modified independent (Device/Increase time) Gait Distance (Feet): 400 Feet Assistive device: None Gait  Pattern/deviations: Step-through pattern;Decreased stride length   Gait velocity interpretation: 1.31 - 2.62 ft/sec, indicative of limited community ambulator General Gait Details: Steady gait with no evidence of imbalance. 2/4 DOE.  Stairs Stairs: Yes Stairs assistance: Modified independent (Device/Increase time) Stair Management: One rail Right Number of Stairs: 13 General stair comments: Cues for technique/safety. 2/4 DOE.  Wheelchair Mobility    Modified Rankin (Stroke Patients Only)       Balance Overall balance assessment: No apparent balance deficits (not formally assessed)                                           Pertinent Vitals/Pain Pain Assessment: No/denies pain    Home Living Family/patient expects to be discharged to:: Private residence Living Arrangements: Spouse/significant other;Children (2 kids, 35 and 27 y/o) Available Help at Discharge: Family;Available PRN/intermittently Type of Home: House Home Access: Stairs to enter Entrance Stairs-Rails: None Entrance Stairs-Number of Steps: 3 Home Layout: Two level;Laundry or work area in [place]: Kasandra Knudsen - single point      Prior Function Level of Independence: Independent with assistive device(s)         Comments: Independent with ADLs, has been using SPC recently; gets some assist with getting in/out of the shower. Does some cleaning. 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     Hand Dominance        Extremity/Trunk Assessment   Upper Extremity Assessment Upper Extremity Assessment: Defer to OT evaluation    Lower Extremity Assessment Lower Extremity Assessment: Overall WFL for  tasks assessed    Cervical / Trunk Assessment Cervical / Trunk Assessment: Other exceptions Cervical / Trunk Exceptions: s/p back surgery  Communication   Communication: No difficulties  Cognition Arousal/Alertness: Awake/alert Behavior During Therapy: WFL for tasks  assessed/performed Overall Cognitive Status: Within Functional Limits for tasks assessed                                        General Comments General comments (skin integrity, edema, etc.): Bandage with some mild weeping.    Exercises     Assessment/Plan    PT Assessment Patent does not need any further PT services  PT Problem List         PT Treatment Interventions      PT Goals (Current goals can be found in the Care Plan section)  Acute Rehab PT Goals Patient Stated Goal: to go home and get back to work PT Goal Formulation: All assessment and education complete, DC therapy    Frequency     Barriers to discharge        Co-evaluation               AM-PAC PT "6 Clicks" Mobility  Outcome Measure Help needed turning from your back to your side while in a flat bed without using bedrails?: None Help needed moving from lying on your back to sitting on the side of a flat bed without using bedrails?: None Help needed moving to and from a bed to a chair (including a wheelchair)?: None Help needed standing up from a chair using your arms (e.g., wheelchair or bedside chair)?: None Help needed to walk in hospital room?: None Help needed climbing 3-5 steps with a railing? : None 6 Click Score: 24    End of Session   Activity Tolerance: Patient tolerated treatment well Patient left: in bed;with call bell/phone within reach;with family/visitor present Nurse Communication: Mobility status PT Visit Diagnosis: Difficulty in walking, not elsewhere classified (R26.2)    Time: 3291-9166 PT Time Calculation (min) (ACUTE ONLY): 17 min   Charges:   PT Evaluation $PT Eval Moderate Complexity: 1 Mod          Marisa Severin, PT, DPT Acute Rehabilitation Services Pager [phone] Office [place] 02/10/2020, 8:39 AM

## 2020-02-10 NOTE — Plan of Care (Signed)
Patient alert and oriented, voiding adequately, and no c/o pain at discharged. Discharged instructions given to patient and mother at bedside, with stated understanding of instructions given. Patient has an appointment with MD in 2 weeks.

## 2020-02-12 NOTE — Anesthesia Postprocedure Evaluation (Signed)
Anesthesia Post Note  Patient: Amber Moss  Procedure(s) Performed: WOUND EXPLORATION;HARDWARE REVISION Lumbar five-Sacral one (N/A Back)     Patient location during evaluation: PACU Anesthesia Type: General Level of consciousness: awake and alert Pain management: pain level controlled Vital Signs Assessment: post-procedure vital signs reviewed and stable Respiratory status: spontaneous breathing, nonlabored ventilation, respiratory function stable and patient connected to nasal cannula oxygen Cardiovascular status: blood pressure returned to baseline and stable Postop Assessment: no apparent nausea or vomiting Anesthetic complications: no   No complications documented.  Last Vitals:  Vitals:   02/10/20 0440 02/10/20 0751  BP: (!) 140/100 128/77  Pulse: (!) 105 96  Resp: 20 18  Temp: 36.6 C 36.7 C  SpO2: 100% 100%    Last Pain:  Vitals:   02/10/20 0751  TempSrc: Oral  PainSc:                  [place] S

## 2020-02-22 DIAGNOSIS — M4306 Spondylolysis, lumbar region: Secondary | ICD-10-CM | POA: Diagnosis not present

## 2020-05-15 ENCOUNTER — Ambulatory Visit (INDEPENDENT_AMBULATORY_CARE_PROVIDER_SITE_OTHER): Payer: BC Managed Care – PPO | Admitting: Neurology

## 2020-05-15 ENCOUNTER — Encounter: Payer: Self-pay | Admitting: Neurology

## 2020-05-15 VITALS — BP 142/83 | HR 83 | Ht 64.0 in | Wt 299.0 lb

## 2020-05-15 DIAGNOSIS — R600 Localized edema: Secondary | ICD-10-CM | POA: Insufficient documentation

## 2020-05-15 DIAGNOSIS — G43709 Chronic migraine without aura, not intractable, without status migrainosus: Secondary | ICD-10-CM | POA: Diagnosis not present

## 2020-05-15 MED ORDER — METHYLPREDNISOLONE 4 MG PO TBPK: ORAL_TABLET | ORAL | 1 refills | Status: DC

## 2020-05-15 MED ORDER — AJOVY 225 MG/1.5ML ~~LOC~~ SOAJ: 225.0000 mg | SUBCUTANEOUS | 11 refills | Status: DC

## 2020-05-15 NOTE — Patient Instructions (Signed)
Start Ajovy  Fremanezumab injection What is this medicine? FREMANEZUMAB (fre ma NEZ ue mab) is used to prevent migraine headaches. This medicine may be used for other purposes; ask your health care provider or pharmacist if you have questions. COMMON BRAND NAME(S): AJOVY What should I tell my health care provider before I take this medicine? They need to know if you have any of these conditions:  an unusual or allergic reaction to fremanezumab, other medicines, foods, dyes, or preservatives  pregnant or trying to get pregnant  breast-feeding How should I use this medicine? This medicine is for injection under the skin. You will be taught how to prepare and give this medicine. Use exactly as directed. Take your medicine at regular intervals. Do not take your medicine more often than directed. It is important that you put your used needles and syringes in a special sharps container. Do not put them in a trash can. If you do not have a sharps container, call your pharmacist or healthcare provider to get one. Talk to your pediatrician regarding the use of this medicine in children. Special care may be needed. Overdosage: If you think you have taken too much of this medicine contact a poison control center or emergency room at once. NOTE: This medicine is only for you. Do not share this medicine with others. What if I miss a dose? If you miss a dose, take it as soon as you can. If it is almost time for your next dose, take only that dose. Do not take double or extra doses. What may interact with this medicine? Interactions are not expected. This list may not describe all possible interactions. Give your health care provider a list of all the medicines, herbs, non-prescription drugs, or dietary supplements you use. Also tell them if you smoke, drink alcohol, or use illegal drugs. Some items may interact with your medicine. What should I watch for while using this medicine? Tell your doctor or  healthcare professional if your symptoms do not start to get better or if they get worse. What side effects may I notice from receiving this medicine? Side effects that you should report to your doctor or health care professional as soon as possible:  allergic reactions like skin rash, itching or hives, swelling of the face, lips, or tongue Side effects that usually do not require medical attention (report these to your doctor or health care professional if they continue or are bothersome):  pain, redness, or irritation at site where injected This list may not describe all possible side effects. Call your doctor for medical advice about side effects. You may report side effects to FDA at 1-800-FDA-1088. Where should I keep my medicine? Keep out of the reach of children. You will be instructed on how to store this medicine. Throw away any unused medicine after the expiration date on the label. NOTE: This sheet is a summary. It may not cover all possible information. If you have questions about this medicine, talk to your doctor, pharmacist, or health care provider.  2021 [employer]/Gold Standard (2016-11-24 17:22:56)

## 2020-05-15 NOTE — Progress Notes (Signed)
[facility]    Provider:  Dr Jaynee Eagles Requesting Provider: Consuella Lose, MD Primary Care Provider:  Rosalee Kaufman, PA-C  CC:  Left leg swelling, headaches and dizziness  HPI:  Amber Moss is a 45 y.o. female here as requested by Consuella Lose, MD for headache.  I reviewed Dr. 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 several months prior, she had good improvement for a while then recurrence of subjective left leg weakness and leg pain, ultimately underwent follow-up imaging demonstrating a small fluid collection, she was scheduled for wound exploration however the day of surgery while under anesthesia she developed significant cardiopulmonary instability and the surgery was aborted before began, cardiac and pulmonary work-up was all benign, she was then rescheduled for her wound exploration, there is no evidence of CSF leak, her interbody cage was tapped further anteriorly by a few millimeters, she noted drastic improvement in her subjective left leg weakness and pain and was discharged home the next day, unfortunately she continued decline of upper neck pain, headache and dizziness, MRI of the brain was completed, she continues to have headache and dizziness.  Dr. Cleotilde Neer examination showed 5 out of 5 strength, normal deep tendon reflexes, normal gait and station.  MRI of the brain showed white matter disease and she does have a history of migraine headaches.  I do not have imaging however I reviewed MRI report which showed mild multifocal T2 flair hyperintensity within the cerebral white matter, likely secondary to chronic small vessel ischemic disease, could also be due to migraine.  She has had headaches and dizziness, Her left leg swells and cramps if she  doesn't wear stockings since the first surgery. She is still swelling. Unknown why and cramping. Dizziness started September/November Jun 26, 2019 after she coded during failed surgery (see above), she feels like her head is "shaking on the inside", she was wondering if it was her eyes, no room spinning, comes and goes, has been gone since January and now back for a week, continuous, not dependent on movement or position (can be sitting or standing or laying or not moving), no headache, she did have a migraine Sunday. She is unsure if she has always had dizziness with the migraines. Her pcp has looked in her ears there was a little fluid but nothing significant, no headache or neck pain, no recent viruses or illness, nothing makes it better or worse, she is not having light sensitivity and unclear if it is associated with the headache. She is having 4-5 migraines in a month. She was doing great on Aimovig, insurance won;t pay for it, last Auguest stopped aimovig and that is possibly when she started getting more migraines and possibly the dizziness.   Reviewed notes, labs and imaging from outside physicians, which showed:  01/26/2020: TSH 2.313  03/06/2020: MRI brain w/wo: Mild multifocal T2/FLAIR hyperintensity within the cerebral white matter, nonspecific but most often secondary to chronic small vessel ischemic disease. Potential alternative etiologies would include sequela of a prior infectious/inflammatory process, migraine headaches or a demyelinating process such as multiple sclerosis.   Mild right ethmoid sinusitis.   Trace right mastoid effusion.   CT lumbar spine 01/24/2020: reviewed report 1. New L5-S1 fusion and decompression from the August CT. Interbody  implants, that on the left is more posteriorly positioned and  encroaches on the left L5  neural foramina (sagittal image 46).   2. Chronic decompression and fusion at L4-L5 with removed L4 pedicle  screws since August. Evidence of solid  posterior interbody  arthrodesis.   3. Other lumbar levels are stable. No acute osseous abnormality.    Electronically Signed  By: Genevie Ann M.D.  On: 01/24/2020 14:22  MRI lumbar spine 01/23/2020: Since the prior CT of 10/28/2019 and lumbar spine MRI of 07/06/2018,  there has been interval posterior decompression, presumed discectomy  and posterior spinal fusion at L5-S1. At L5-S1, there is prominent  signal hypointensity within the ventral aspectof the spinal canal  concerning for possible posterior displacement of the interbody  spacer. CT of the lumbar spine is recommended for further  evaluation. Also at this level, there is a dorsal fluid collection  with mass effect upon the thecal sac. Suspected multifactorial  severe spinal canal stenosis and mild left neural foraminal  narrowing at this level.   At L4-L5, redemonstrated sequela of prior posterior decompression  and fusion. No significant canal stenosis at the disc space level.  However, a dorsal fluid collection partially effaces the thecal sac  at the L5 vertebral body level with moderate canal narrowing.   No significant disc herniation, spinal canal stenosis or neural  foraminal narrowing at the remaining lumbar levels.     Review of Systems: Patient complains of symptoms per HPI as well as the following symptoms: left leg cramping. Pertinent negatives and positives per HPI. All others negative.   Social History   Socioeconomic History  . Marital status: Married    Spouse name: Not on file  . Number of children: Not on file  . Years of education: Not on file  . Highest education level: Not on file  Occupational History  . Not on file  Tobacco Use  . Smoking status: Never Smoker  . Smokeless tobacco: Never Used  Vaping Use  . Vaping Use: Never used  Substance and Sexual Activity  . Alcohol use: Never  . Drug use: Never  . Sexual activity: Not Currently    Comment: ablation  Other Topics Concern   . Not on file  Social History Narrative   Lives at home with spouse    Right handed   Caffeine: 1 cup with dinner (soda or tea)    Social Determinants of Health   Financial Resource Strain: Not on file  Food Insecurity: Not on file  Transportation Needs: Not on file  Physical Activity: Not on file  Stress: Not on file  Social Connections: Not on file  Intimate Partner Violence: Not on file    Family History  Problem Relation Age of Onset  . Thyroid disease Maternal Grandfather   . Hypertension Mother   . Hyperlipidemia Father   . Hypertension Father   . Diabetes Mellitus II Father   . Headache Maternal Grandmother        ?migraines    Past Medical History:  Diagnosis Date  . Arthritis    back  . Depression   . Easy bruising   . Fatty liver   . GERD (gastroesophageal reflux disease)   . Hypercholesterolemia    diet controlled, no med  . Hypothyroidism   . Migraines   . Polycystic ovarian disease   . PONV (postoperative nausea and vomiting)     Patient Active Problem List   Diagnosis Date Noted  . Edema of left lower leg 05/15/2020  . Chronic migraine without aura without status migrainosus, not intractable 05/15/2020  .  Lumbar radiculopathy 02/09/2020  . Post op infection 01/26/2020  . Respiratory failure with hypoxia ([place]) 01/26/2020  . Lumbosacral radiculopathy at S1 10/06/2019  . 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 HNP (herniated nucleus pulposus), lumbar 10/22/2017  . Lumbar herniated disc 10/22/2017    Past Surgical History:  Procedure Laterality Date  . ABLATION     cervical ablation at MD's Office  . BACK SURGERY  12/2017, 11/22/19  . CARPAL TUNNEL RELEASE Bilateral   . CHOLECYSTECTOMY  04/2007   Dr. Lindalou Hose  . DILATION AND CURETTAGE OF UTERUS  12/2011  . EYE SURGERY Bilateral    Lasik  . LUMBAR LAMINECTOMY/DECOMPRESSION MICRODISCECTOMY Left 10/22/2017   Procedure: LUMBAR - FIVE LAMINOTOMY AND MICRODISCECTOMY;  Surgeon: Consuella Lose, MD;  Location: [facility];  Service: Neurosurgery;  Laterality: Left;   LUMBAR - FIVE LAMINOTOMY AND MICRODISCECTOMY    Current Outpatient Medications  Medication Sig Dispense Refill  . citalopram (CELEXA) 40 MG tablet Take 40 mg by mouth daily.    . Fremanezumab-vfrm (AJOVY) 225 MG/1.5ML SOAJ Inject 225 mg into the skin every 30 (thirty) days. 1.5 mL 11  . ibuprofen (ADVIL) 800 MG tablet Take 800 mg by mouth daily as needed for mild pain or moderate pain.     . indomethacin (INDOCIN) 50 MG capsule Take 50 mg by mouth 2 (two) times daily with a meal.    . methylPREDNISolone (MEDROL DOSEPAK) 4 MG TBPK tablet Take pills daily together in the morning with food for 6 days. 6-5-4-3-2-1. 21 tablet 1  . pantoprazole (PROTONIX) 40 MG tablet Take 40 mg by mouth daily as needed (reflux).     . rizatriptan (MAXALT) 10 MG tablet Take 10 mg by mouth as needed for migraine.     Marland Kitchen rOPINIRole (REQUIP) 0.25 MG tablet Take 0.25 mg by mouth at bedtime as needed (restless leg).     . SYNTHROID 300 MCG tablet Take 300 mcg by mouth daily before breakfast.    . traMADol (ULTRAM) 50 MG tablet Take 50 mg by mouth 3 (three) times daily as needed for moderate pain.     Marland Kitchen venlafaxine (EFFEXOR) 37.5 MG tablet Take 37.5 mg by mouth 2 (two) times daily.     No current facility-administered medications for this visit.    Allergies as of 05/15/2020 - Review Complete 05/15/2020  Allergen Reaction Noted  . Doxycycline Hives, Diarrhea, and Nausea And Vomiting 02/20/2017  . Keflex [cephalexin] Hives, Diarrhea, and Nausea And Vomiting 10/22/2017  . Baclofen Other (See Comments) 01/24/2020  . Vancomycin Itching 01/24/2020    Vitals: BP (!) 142/83 (BP Location: Left Arm, Patient Position: Sitting) Comment (BP Location): forearm  Pulse 83   Ht 5\' 4"  (1.626 m)   Wt 299 lb (135.6 kg)   BMI 51.32 kg/m  Last Weight:  Wt Readings from Last 1 Encounters:  05/15/20 299 lb (135.6 kg)   Last Height:   Ht Readings from  Last 1 Encounters:  05/15/20 5\' 4"  (1.626 m)     Physical exam: Exam: Gen: NAD, conversant, well nourised, obese, well groomed                     CV: RRR, no MRG. No Carotid Bruits. No peripheral edema, warm, nontender Eyes: Conjunctivae clear without exudates or hemorrhage  Neuro: Detailed Neurologic Exam  Speech:    Speech is normal; fluent and spontaneous with normal comprehension.  Cognition:    The patient is oriented to person, place, and time;  recent and remote memory intact;     language fluent;     normal attention, concentration,     fund of knowledge Cranial Nerves:    The pupils are equal, round, and reactive to light. The fundi are normal and spontaneous venous pulsations are present.flat.  Visual fields are full to finger confrontation. Extraocular movements are intact. Trigeminal sensation is intact and the muscles of mastication are normal. The face is symmetric. The palate elevates in the midline. Hearing intact. Voice is normal. Shoulder shrug is normal. The tongue has normal motion without fasciculations.   Coordination:    No ataxia or dysmetria   Gait:    slighty antalgic  Motor Observation:    No asymmetry, no atrophy, and no involuntary movements noted. Tone:    Normal muscle tone.    Posture:    Posture is normal. normal erect    Strength: Mile left leg flexion weakness otherwise strength is V/V in the upper and lower limbs.      Sensation: intact to LT     Reflex Exam:  DTR's:    Deep tendon reflexes in the upper and lower extremities are normal bilaterally.  Slight decrease right AJ vs left AJ Toes:    The toes are downgoing bilaterally.   Clonus:    Clonus is absent.    Assessment/Plan:  45 year old with complicated history of multiple lumbar spine surgeries.   Chronic left leg swelling since surgery in 2019, no etiology known, swelling and cramping necessitating compression stockings, she has had LE dopplers, could this be a more  proximal arterial/venous problem? This is not commonly due to radiculopathy or nerve impingement, can't give her an answer, pcp can't give her an answer, Will send her to vascular per request.   Dizziness and migraines: Worsened since stopped aimovig. Restart Ajovy and hopefully the $5 copay card will work. Failed lyrica, amitriptyline, celexa, aimovig, indocin, maxalt, sumatriptan, effexor, tramadol, tylenol, verapamil, gabapentin, robaxin, zofran, phenergan, topiramate  Orders Placed This Encounter  Procedures  . Ambulatory referral to Vascular Surgery   Meds ordered this encounter  Medications  . methylPREDNISolone (MEDROL DOSEPAK) 4 MG TBPK tablet    Sig: Take pills daily together in the morning with food for 6 days. 6-5-4-3-2-1.    Dispense:  21 tablet    Refill:  1  . Fremanezumab-vfrm (AJOVY) 225 MG/1.5ML SOAJ    Sig: Inject 225 mg into the skin every 30 (thirty) days.    Dispense:  1.5 mL    Refill:  11    Cc: Consuella Lose, MD,  Rosalee Kaufman, [place]  Sarina Ill, MD  [facility] [address] [place], [place] [address]  Phone [phone] Fax [phone]

## 2020-05-23 ENCOUNTER — Telehealth: Payer: Self-pay | Admitting: Neurology

## 2020-05-23 NOTE — Telephone Encounter (Signed)
PA c/o through [employer]MM/[employer] KEY: [id number] Approved immediately 05/23/2020 through 08/14/2020.

## 2020-06-04 ENCOUNTER — Other Ambulatory Visit: Payer: Self-pay | Admitting: Neurology

## 2020-06-04 ENCOUNTER — Other Ambulatory Visit: Payer: Self-pay | Admitting: *Deleted

## 2020-06-04 DIAGNOSIS — R600 Localized edema: Secondary | ICD-10-CM

## 2020-06-04 DIAGNOSIS — G43709 Chronic migraine without aura, not intractable, without status migrainosus: Secondary | ICD-10-CM

## 2020-06-11 ENCOUNTER — Ambulatory Visit (HOSPITAL_COMMUNITY)
Admission: RE | Admit: 2020-06-11 | Discharge: 2020-06-11 | Disposition: A | Discharge status: Home / Self Care | Payer: BC Managed Care – PPO | Source: Ambulatory Visit | Attending: Surgery | Admitting: Surgery

## 2020-06-11 ENCOUNTER — Other Ambulatory Visit: Payer: Self-pay

## 2020-06-11 ENCOUNTER — Ambulatory Visit (INDEPENDENT_AMBULATORY_CARE_PROVIDER_SITE_OTHER): Payer: BC Managed Care – PPO | Admitting: Physician Assistant

## 2020-06-11 ENCOUNTER — Encounter: Payer: Self-pay | Admitting: Physician Assistant

## 2020-06-11 VITALS — BP 158/94 | HR 79 | Temp 98.6°F | Resp 20 | Ht 64.0 in | Wt 295.2 lb

## 2020-06-11 DIAGNOSIS — R600 Localized edema: Secondary | ICD-10-CM | POA: Insufficient documentation

## 2020-06-11 DIAGNOSIS — M79605 Pain in left leg: Secondary | ICD-10-CM | POA: Diagnosis not present

## 2020-06-11 NOTE — Progress Notes (Signed)
[facility] [place]   Reason for referral: left leg numbness with GT toe cramping  History of Present Illness  Amber Moss is a 45 y.o. female who presents with chief complaint: swollen leg.  Patient notes, onset  2019, associated with multiple spinal surgeries.  The patient has had no history of DVT, no history of varicose vein, no history of venous stasis ulcers, no history of  Lymphedema and no history of skin changes in lower legs.  There is no family history of venous disorders.  The patient has  used compression stockings in the past.  She denise claudication, rest pain, and non healing wounds.  She states her left GT is painful at the tip and it craps at night.  She is unable to walk for exercise because of her spinal surgeries x 4. The numbness does not bother her too much.         Past Medical History:  Diagnosis Date  . Arthritis    back  . Depression   . Easy bruising   . Fatty liver   . GERD (gastroesophageal reflux disease)   . Hypercholesterolemia    diet controlled, no med  . Hypothyroidism   . Migraines   . Polycystic ovarian disease   . PONV (postoperative nausea and vomiting)     Past Surgical History:  Procedure Laterality Date  . ABLATION     cervical ablation at MD's Office  . BACK SURGERY  12/2017, 11/22/19  . CARPAL TUNNEL RELEASE Bilateral   . CHOLECYSTECTOMY  04/2007   Dr. Lindalou Hose  . DILATION AND CURETTAGE OF UTERUS  12/2011  . EYE SURGERY Bilateral    Lasik  . LUMBAR LAMINECTOMY/DECOMPRESSION MICRODISCECTOMY Left 10/22/2017   Procedure: LUMBAR - FIVE LAMINOTOMY AND MICRODISCECTOMY;  Surgeon: Consuella Lose, MD;  Location: [facility];  Service: Neurosurgery;  Laterality: Left;   LUMBAR - FIVE LAMINOTOMY AND MICRODISCECTOMY    Social History   Socioeconomic History  . Marital status: Married    Spouse name: Not on file  . Number of children: 2  . Years of education: Not on file  . Highest education level: Not on  file  Occupational History  . Not on file  Tobacco Use  . Smoking status: Never Smoker  . Smokeless tobacco: Never Used  Vaping Use  . Vaping Use: Never used  Substance and Sexual Activity  . Alcohol use: Never  . Drug use: Never  . Sexual activity: Not Currently    Comment: ablation  Other Topics Concern  . Not on file  Social History Narrative   Lives at home with spouse    1 Biological child, 1 adopted   Right handed    Caffeine: 1 cup with dinner (soda or tea)    Social Determinants of Health   Financial Resource Strain: Not on file  Food Insecurity: Not on file  Transportation Needs: Not on file  Physical Activity: Not on file  Stress: Not on file  Social Connections: Not on file  Intimate Partner Violence: Not on file    Family History  Problem Relation Age of Onset  . Thyroid disease Maternal Grandfather   . Hypertension Mother   . Hyperlipidemia Father   . Hypertension Father   . Diabetes Mellitus II Father   . Headache Maternal Grandmother        ?migraines    Current Outpatient Medications on File Prior to Visit  Medication Sig Dispense Refill  . citalopram (CELEXA) 40  MG tablet Take 40 mg by mouth daily.    . Fremanezumab-vfrm (AJOVY) 225 MG/1.5ML SOAJ Inject 225 mg into the skin every 30 (thirty) days. 1.5 mL 11  . ibuprofen (ADVIL) 800 MG tablet Take 800 mg by mouth daily as needed for mild pain or moderate pain.     . indomethacin (INDOCIN) 50 MG capsule Take 50 mg by mouth 2 (two) times daily with a meal.    . meclizine (ANTIVERT) 25 MG tablet Take 25 mg by mouth 3 (three) times daily as needed.    . pantoprazole (PROTONIX) 40 MG tablet Take 40 mg by mouth daily as needed (reflux).     . promethazine (PHENERGAN) 25 MG tablet Take 25 mg by mouth every 8 (eight) hours.    . rizatriptan (MAXALT) 10 MG tablet Take 10 mg by mouth as needed for migraine.     Marland Kitchen rOPINIRole (REQUIP) 0.25 MG tablet Take 0.25 mg by mouth at bedtime as needed (restless leg).      . SYNTHROID 300 MCG tablet Take 300 mcg by mouth daily before breakfast.    . traMADol (ULTRAM) 50 MG tablet Take 50 mg by mouth 3 (three) times daily as needed for moderate pain.     Marland Kitchen triamcinolone (KENALOG) 0.1 % SMARTSIG:1 Application Topical 2-3 Times Daily    . venlafaxine (EFFEXOR) 37.5 MG tablet Take 37.5 mg by mouth 2 (two) times daily.     No current facility-administered medications on file prior to visit.    Allergies as of 06/11/2020 - Review Complete 06/11/2020  Allergen Reaction Noted  . Doxycycline Hives, Diarrhea, and Nausea And Vomiting 02/20/2017  . Keflex [cephalexin] Hives, Diarrhea, and Nausea And Vomiting 10/22/2017  . Baclofen Other (See Comments) 01/24/2020  . Vancomycin Itching 01/24/2020     ROS:   General:  No weight loss, Fever, chills  HEENT: No recent headaches, no nasal bleeding, no visual changes, no sore throat  Neurologic: No dizziness, blackouts, seizures. No recent symptoms of stroke or mini- stroke. No recent episodes of slurred speech, or temporary blindness.  Cardiac: No recent episodes of chest pain/pressure, no shortness of breath at rest.  No shortness of breath with exertion.  Denies history of atrial fibrillation or irregular heartbeat  Vascular: No history of rest pain in feet.  No history of claudication.  No history of non-healing ulcer, No history of DVT   Pulmonary: No home oxygen, no productive cough, no hemoptysis,  No asthma or wheezing  Musculoskeletal:  [ ]  Arthritis, [x ] Low back pain,  [ ]  Joint pain  Hematologic:No history of hypercoagulable state.  No history of easy bleeding.  No history of anemia  Gastrointestinal: No hematochezia or melena,  No gastroesophageal reflux, no trouble swallowing  Urinary: [ ]  chronic Kidney disease, [ ]  on HD - [ ]  MWF or [ ]  TTHS, [ ]  Burning with urination, [ ]  Frequent urination, [ ]  Difficulty urinating;   Skin: No rashes  Psychological: No history of anxiety,  No history of  depression  Physical Examination  Vitals:   06/11/20 1301  BP: (!) 158/94  Pulse: 79  Resp: 20  Temp: 98.6 F (37 C)  TempSrc: Temporal  SpO2: 99%  Weight: 295 lb 3.2 oz (133.9 kg)  Height: 5\' 4"  (1.626 m)    Body mass index is 50.67 kg/m.  General:  Alert and oriented, no acute distress HEENT: Normal Neck: No bruit or JVD Pulmonary: Clear to auscultation bilaterally Cardiac: Regular Rate and Rhythm  without murmur Abdomen: Soft, non-tender, non-distended, no mass, no scars Skin: No rash Extremity Pulses:  2+ radial, brachial, dorsalis pedis, non palpable posterior tibial pulses bilaterally.  Right LE doppler DP/PT/peroneal, left peroneal signal only Musculoskeletal: No deformity or edema  Neurologic: Upper and lower extremity motor 5/5 and symmetric, left < sensation compared to the right.  DATA: +--------------+---------+------+-----------+------------+-------------+  LEFT     Reflux NoRefluxReflux TimeDiameter cmsComments                  Yes                      +--------------+---------+------+-----------+------------+-------------+  CFV      no                            +--------------+---------+------+-----------+------------+-------------+  FV mid    no                            +--------------+---------+------+-----------+------------+-------------+  Popliteal   no                            +--------------+---------+------+-----------+------------+-------------+  GSV at SFJ        yes  >500 ms   0.77           +--------------+---------+------+-----------+------------+-------------+  GSV prox thighno               0.46           +--------------+---------+------+-----------+------------+-------------+  GSV mid thigh no               0.35            +--------------+---------+------+-----------+------------+-------------+  GSV dist thighno               0.35           +--------------+---------+------+-----------+------------+-------------+  GSV at knee  no               0.34           +--------------+---------+------+-----------+------------+-------------+  GSV prox calf       yes  >500 ms   0.29  out of fascia  +--------------+---------+------+-----------+------------+-------------+  GSV mid calf no               0.18           +--------------+---------+------+-----------+------------+-------------+  SSV Pop Fossa no               0.22           +--------------+---------+------+-----------+------------+-------------+  SSV prox calf no               0.21           +--------------+---------+------+-----------+------------+-------------+  SSV mid calf no               0.20           +--------------+---------+------+-----------+------------+-------------+  AASV     no               0.29           +--------------+---------+------+-----------+------------+-------------+   Summary:  Left:  - No evidence of deep vein thrombosis seen in the left lower extremity,  from the common femoral through the popliteal veins.  - No evidence of superficial venous reflux seen in the left short  saphenous vein.  - Venous reflux is noted in the  left sapheno-femoral junction.  - Venous reflux is noted in the left greater saphenous vein in the  proximal calf.   Assessment: Lumbar radiculopathy s/p surgery with fusion x 4  Venous reflux, negative DVT left LE She has minimal reflux and no complaint of edema.    Her biggest complaint is numbness and the toe cramping.  She has no complaints of claudication, but her back pain  prevents her from walking to much, no non healing wounds.  I was concerned that I could not palpate her pulses on the left and that I got 1 of the three doppler signals .  I will order ABI's to check her arterial flow.  If the ABI is normal she will be measured for knee high compression 15-20 for comfort.  If her ABI is abnormal we will schedule her for angiogram.  The ABI will be scheduled as a phone call visit.     Roxy Horseman PA-C [facility] Office: [phone]   MD in clinic [place]

## 2020-06-12 ENCOUNTER — Other Ambulatory Visit: Payer: Self-pay

## 2020-06-12 DIAGNOSIS — M79605 Pain in left leg: Secondary | ICD-10-CM

## 2020-07-04 ENCOUNTER — Other Ambulatory Visit: Payer: Self-pay

## 2020-07-04 ENCOUNTER — Ambulatory Visit (HOSPITAL_COMMUNITY)
Admission: RE | Admit: 2020-07-04 | Discharge: 2020-07-04 | Disposition: A | Discharge status: Home / Self Care | Payer: BC Managed Care – PPO | Source: Ambulatory Visit | Attending: Vascular Surgery | Admitting: Vascular Surgery

## 2020-07-04 DIAGNOSIS — M79605 Pain in left leg: Secondary | ICD-10-CM

## 2020-07-06 ENCOUNTER — Other Ambulatory Visit: Payer: Self-pay

## 2020-07-06 ENCOUNTER — Ambulatory Visit (INDEPENDENT_AMBULATORY_CARE_PROVIDER_SITE_OTHER): Payer: BC Managed Care – PPO | Admitting: Physician Assistant

## 2020-07-06 DIAGNOSIS — M79605 Pain in left leg: Secondary | ICD-10-CM

## 2020-07-06 NOTE — Progress Notes (Signed)
Virtual Visit via Telephone Note  Follow-up recent consultation for LE edema  I connected with Amber Moss on 07/06/2020 using by telephone and verified that I was speaking with the correct person. I am located at [facility].   The limitations of evaluation and management by telemedicine and the availability of in person appointments have been previously discussed with the patient and are documented in the patients chart. The patient expressed understanding and consented to proceed.  PCP: Rosalee Kaufman, PA-C  Chief Complaint: Follow-up ABIs  History of Present Illness: Amber Moss is a 45 y.o. female with complaint is numbness and the toe cramping.  She has no complaints of claudication, but her back pain prevents her from walking to much, no non healing wounds.             There was concerns regarding non palpaable pulses on the left and  1 of the three doppler signals .  I will order ABI's to check her arterial flow.  If the ABI is normal she will be measured for knee high compression 15-20 for comfort.  If her ABI is abnormal we will schedule her for angiogram.  The ABI will be scheduled as a phone call visit.   Past Medical History:  Diagnosis Date  . Arthritis    back  . Depression   . Easy bruising   . Fatty liver   . GERD (gastroesophageal reflux disease)   . Hypercholesterolemia    diet controlled, no med  . Hypothyroidism   . Migraines   . Polycystic ovarian disease   . PONV (postoperative nausea and vomiting)     Past Surgical History:  Procedure Laterality Date  . ABLATION     cervical ablation at MD's Office  . BACK SURGERY  12/2017, 11/22/19  . CARPAL TUNNEL RELEASE Bilateral   . CHOLECYSTECTOMY  04/2007   Dr. Lindalou Hose  . DILATION AND CURETTAGE OF UTERUS  12/2011  . EYE SURGERY Bilateral    Lasik  . LUMBAR LAMINECTOMY/DECOMPRESSION MICRODISCECTOMY Left 10/22/2017   Procedure: LUMBAR - FIVE LAMINOTOMY AND MICRODISCECTOMY;   Surgeon: Consuella Lose, MD;  Location: [facility];  Service: Neurosurgery;  Laterality: Left;   LUMBAR - FIVE LAMINOTOMY AND MICRODISCECTOMY    No outpatient medications have been marked as taking for the 07/06/20 encounter (Office Visit) with [facility].    12 system ROS was negative unless otherwise noted in HPI   Observations/Objective: 07/06/2020 Right: Resting right ankle-brachial index is within normal range. No  evidence of significant right lower extremity arterial disease.  The right toe-brachial index is normal.   Left: Resting left ankle-brachial index is within normal range. No  evidence of significant left lower extremity arterial disease.  The left toe-brachial index is normal.   Assessment and Plan: Lumbar radiculopathy s/p surgery with fusion x 4 . Follow-up with neurology Venous reflux, negative DVT left LE She has minimal reflux and no complaint of edema.     Follow Up Instructions:   Follow up prn   I discussed the assessment and treatment plan with the patient. The patient was provided an opportunity to ask questions and all were answered. The patient agreed with the plan and demonstrated an understanding of the instructions.   The patient was advised to call back or seek an in-person evaluation if the symptoms worsen or if the condition fails to improve as anticipated.  I spent 5 minutes with the patient via telephone encounter.   Signed,  Barbie Banner [facility] Office: [phone]  07/06/2020, 12:01 PM

## 2020-08-01 NOTE — Progress Notes (Signed)
History: This is a patient with low back pain, paresthesias and history of surgery.  She saw Dr. Kathyrn Sheriff at [facility] in the past, she underwent left-sided L4-L5 laminotomy and microdiscectomy in August 2019, with good improvement in her left leg but then had recurrence of her pain, follow-up imaging demonstrated recurrent disc herniation in the same area and she elected to proceed with surgical decompression and fusion which was done in October 2019.  She followed up with Dr. Kathyrn Sheriff in Jun 26, 2018 complaining of right-sided back and leg pain and recurrent left-sided leg pain.  Another MRI was completed in April 2020 and by his review there was good surgical decompression without any identifiable stenosis at L4-L5 however at L5-S1 there was a disc desiccation, fairly broad-based right eccentric disc bulge with resultant primary lateral recess stenosis now at the L5-S1 level.  When I saw her initially in July 2020 and reviewed her MRI it indeed showed PLIF at L4-L5 without residual or recurrent stenosis but showed a broad-based right subarticular disc protrusion at L5-S1 impinging upon the descending right S1 nerve root in the right lateral recess.  Symptoms on the left thought to be residual from nerve trapped meant status post surgery and symptoms on the right thought to be ongoing S1 nerve impingement and she was referred back to Dr. Kathyrn Sheriff. MRI in November 2021 showed a dorsal fluid collection partially effacing the thecal sac at the L5 vertebral body with moderate canal narrowing but no significant disc herniation spinal canal stenosis or neuroforaminal narrowing at the remaining lumbar levels and she had already followed up with neurosurgery about these MRI findings. We also recommended weight loss.  Patient was seen again in my clinic in 06/26/2022 of this year 2020/06/25 with complaints mostly of left leg swelling and cramping since the surgeries.    I advised her that swelling not very common for primary  radiculopathy etiology and we sent her to vascular surgery for more imaging.  She is back today with complaints of left leg problems, denies any right leg issues currently. Patient states today since 2017/06/25 her left leg feels "dead", starts in the low back and radiates down the back of the thigh to the back of the leg and bottom of the foot and top of the toes. No tenderness at the fibular head. She has a lot of cramping in the left leg in the calf and she can only feel her big toe. Focused exam shows symmetrical 1+ AJs, left leg extension/flexion weakness and mild left dorsiflexion weakness.  Decreased pin prick proximally and distally in L5/S1 but has more feeling top of great right toe without pain on palpation at the fibular head. Worsening low backpain and numbness, last mri 01/2020 can repeat MRI lumbar spine and also send to Dr. Wynelle Link to ensure no peroneal entrapment at the knees. Arms are not involved and sensory changes definitely in a radicular pattern so do not suspect any other more proximal central involvement.   As far as migraines go, triptan did not work, hasn't tried the nurtec yet, back on the Aimovig(no side effects), will follow up in a few months. We discussed nurtec qod, qulipta, botox, Roselyn Meier will follow up with megan/amy in 3 months.   - Suspect bilateral peroneal motor and sensory conduction abnormalities are technical due to morbid obesity and large leg because right leg is uninvoled and clinical symptoms do not entirely correlate but will xr the left knee and send to ortho for evaluation of possible peroneal neuropathy.  Will repeat MRI lumbar spine due to worsening left leg weakness and sensory changes in L5/S1 radicular pattern. Continue to lose weight, she is on Ozempic now, recommended the [facility] but she lives in [place] and that is too far, continue with primary care.   Orders Placed This Encounter  Procedures  . DG Knee 4 Views W/Patella Left  . MR  LUMBAR SPINE WO CONTRAST  . Ambulatory referral to Orthopedic Surgery   I spent 30 minutes of face-to-face and non-face-to-face time with patient on the  1. Left leg weakness   2. Left leg numbness   3. Fall, initial encounter   4. Chronic left-sided lumbar radiculopathy   5. Acute pain of left knee   6. Neuropathy of left peroneal nerve   7. Chronic migraine without aura without status migrainosus, not intractable    diagnosis.  This included previsit chart review, lab review, study review, order entry, electronic health record documentation, patient education on the different diagnostic and therapeutic options, counseling and coordination of care, risks and benefits of management, compliance, or risk factor reduction. This does not include time spent on emg/ncs.

## 2020-08-02 ENCOUNTER — Ambulatory Visit (INDEPENDENT_AMBULATORY_CARE_PROVIDER_SITE_OTHER): Payer: BC Managed Care – PPO | Admitting: Neurology

## 2020-08-02 ENCOUNTER — Telehealth: Payer: Self-pay | Admitting: Neurology

## 2020-08-02 DIAGNOSIS — G43709 Chronic migraine without aura, not intractable, without status migrainosus: Secondary | ICD-10-CM

## 2020-08-02 DIAGNOSIS — R29898 Other symptoms and signs involving the musculoskeletal system: Secondary | ICD-10-CM | POA: Diagnosis not present

## 2020-08-02 DIAGNOSIS — M5416 Radiculopathy, lumbar region: Secondary | ICD-10-CM

## 2020-08-02 DIAGNOSIS — Z0289 Encounter for other administrative examinations: Secondary | ICD-10-CM

## 2020-08-02 DIAGNOSIS — G5732 Lesion of lateral popliteal nerve, left lower limb: Secondary | ICD-10-CM

## 2020-08-02 DIAGNOSIS — R200 Anesthesia of skin: Secondary | ICD-10-CM

## 2020-08-02 DIAGNOSIS — M79605 Pain in left leg: Secondary | ICD-10-CM

## 2020-08-02 DIAGNOSIS — M25562 Pain in left knee: Secondary | ICD-10-CM

## 2020-08-02 DIAGNOSIS — W19XXXA Unspecified fall, initial encounter: Secondary | ICD-10-CM

## 2020-08-02 NOTE — Progress Notes (Signed)
Full Name: Amber Moss Gender: Female MRN #: [id number] Date of Birth: [birth year]    Visit Date: 08/02/2020 07:44 Age: 45 Years Examining Physician: Sarina Ill, MD  Referring Physician: Clemmie Krill, PA Height: 5 feet 4 inch Patient History: 295lbs    History: Patient states today since 2017/05/31 her left leg feels "dead", starts in the low back and radiates down the back of the thigh to the back of the leg and bottom of the foot and top of the toes. No tenderness at the fibular head. She has a lot of cramping in the left leg in the calf and she can only feel her big toe. Focused exam shows symmetrical 1+ AJs, left leg extension/flexion weakness and mild left dorsiflexion weakness.    Summary: NCS performed on the bilateral lower extremities EMG performed on the left lower extremity.  The left peroneal EDB motor nerve showed reduced amplitude (1.3 mV, normal greater than 2).  The right peroneal EDB motor nerve showed reduced amplitude (0.9 mV, normal greater than 2).  Bilateral tibialis anterior peroneal motor nerves showed no response. All remaining nerves (as indicated in the following tables) were within normal limits. All muscles (as indicated in the following tables) were within normal limits.    Conclusion: NCS show abnormal bilateral peroneal motor conductions which may be consistent with lumbar radiculopathy or, less likely, peroneal neuropathy(normal sensory conductions). EMG needle study was normal without acute/ongoing denervation to indicate a current/ongoing L5/S1 lumbar radiculopathy.  Will X-Ray the left knee and repeat MRI lumbar spine due to worsening left leg weakness and sensory changes in L5/S1 radicular pattern.   Sarina Ill, M.D.  [facility] [address], [place], [place] [address] Tel: [phone] Fax: [phone]  Verbal informed consent was obtained from the patient, patient was informed of potential risk of procedure,  including bruising, bleeding, hematoma formation, infection, muscle weakness, muscle pain, numbness, among others.        [place]    Nerve / Sites Muscle Latency Ref. Amplitude Ref. Rel Amp Segments Distance Velocity Ref. Area    ms ms mV mV %  cm m/s m/s mVms  L Peroneal - EDB     Ankle EDB 3.5 ?6.5 1.3 ?2.0 100 Ankle - EDB 9   3.9     Fib head EDB 9.6  0.5  37.1 Fib head - Ankle 27 44 ?44 2.5     Pop fossa EDB 11.9  1.2  248 Pop fossa - Fib head 10 44 ?44 4.1         Pop fossa - Ankle      R Peroneal - EDB     Ankle EDB 3.8 ?6.5 0.9 ?2.0 100 Ankle - EDB 9   4.2     Fib head EDB 9.9  0.5  56.5 Fib head - Ankle 27 44 ?44 1.6     Pop fossa EDB 12.2  0.6  110 Pop fossa - Fib head 10 44 ?44 2.8         Pop fossa - Ankle      L Tibial - AH     Ankle AH 4.3 ?5.8 10.6 ?4.0 100 Ankle - AH 9   22.4     Pop fossa AH 12.7  3.2  29.9 Pop fossa - Ankle 35 41 ?41 7.7  L Peroneal - Tib Ant     Fib Head Tib Ant NR ?4.7 NR ?3.0 NR Fib Head - Tib Ant  10 NR  NR  R Peroneal - Tib Ant     Fib Head Tib Ant NR ?4.7 NR ?3.0 NR Fib Head - Tib Ant 10 NR  NR               SNC    Nerve / Sites Rec. Site Peak Lat Ref.  Amp Ref. Segments Distance    ms ms V V  cm  L Sural - Ankle (Calf)     Calf Ankle 3.4 ?4.4 14 ?6 Calf - Ankle 14  L Superficial peroneal - Ankle     Lat leg Ankle 2.7 ?4.4 10 ?6 Lat leg - Ankle 14  R Superficial peroneal - Ankle     Lat leg Ankle 2.5 ?4.4 6 ?6 Lat leg - Ankle 14           F  Wave    Nerve F Lat Ref.   ms ms  L Tibial - AH 45.7 ?56.0       EMG Summary Table    Spontaneous MUAP Recruitment  Muscle IA Fib PSW Fasc Other Amp Dur. Poly Pattern  L. Vastus medialis Normal None None None _______ Normal Normal Normal Normal  L. Tibialis anterior Normal None None None _______ Normal Normal Normal Normal  L. Gastrocnemius (Medial head) Normal None None None _______ Normal Normal Normal Normal  L. Extensor hallucis longus Normal None None None _______ Normal Normal Normal  Normal  L. Peroneus longus Normal None None None _______ Normal Normal Normal Normal  L. Biceps femoris (long head) Normal None None None _______ Normal Normal Normal Normal  L. Biceps femoris (short head) Normal None None None _______ Normal Normal Normal Normal  L. Gluteus maximus Normal None None None _______ Normal Normal Normal Normal  L. Gluteus medius Normal None None None _______ Normal Normal Normal Normal

## 2020-08-02 NOTE — Telephone Encounter (Signed)
MR Lumbar Spine wo contrast bcbs auth: [id number] (08/01/20-01/28/21)  Scheduled at [facility]

## 2020-08-02 NOTE — Progress Notes (Signed)
See procedure note.

## 2020-08-07 ENCOUNTER — Other Ambulatory Visit: Payer: BC Managed Care – PPO

## 2020-08-07 NOTE — Procedures (Signed)
Full Name: Amber Moss Gender: Female MRN #: [id number] Date of Birth: [birth year]    Visit Date: 08/02/2020 07:44 Age: 45 Years Examining Physician: Sarina Ill, MD  Referring Physician: Clemmie Krill, PA Height: 5 feet 4 inch Patient History: 295lbs    History: Patient states today since 06/11/2017 her left leg feels "dead", starts in the low back and radiates down the back of the thigh to the back of the leg and bottom of the foot and top of the toes. No tenderness at the fibular head. She has a lot of cramping in the left leg in the calf and she can only feel her big toe. Focused exam shows symmetrical 1+ AJs, left leg extension/flexion weakness and mild left dorsiflexion weakness.    Summary: NCS performed on the bilateral lower extremities EMG performed on the left lower extremity.  The left peroneal EDB motor nerve showed reduced amplitude (1.3 mV, normal greater than 2).  The right peroneal EDB motor nerve showed reduced amplitude (0.9 mV, normal greater than 2).  Bilateral tibialis anterior peroneal motor nerves showed no response. All remaining nerves (as indicated in the following tables) were within normal limits. All muscles (as indicated in the following tables) were within normal limits.    Conclusion: NCS show abnormal bilateral peroneal motor conductions which may be consistent with lumbar radiculopathy or, less likely, peroneal neuropathy(normal sensory conductions). EMG needle study was normal without acute/ongoing denervation to indicate a current/ongoing L5/S1 lumbar radiculopathy.  Will X-Ray the left knee and repeat MRI lumbar spine due to worsening left leg weakness and sensory changes in L5/S1 radicular pattern.   Sarina Ill, M.D.  [facility] [address] [place], [place] [address] Tel: [phone] Fax: [phone]  Verbal informed consent was obtained from the patient, patient was informed of potential risk of procedure,  including bruising, bleeding, hematoma formation, infection, muscle weakness, muscle pain, numbness, among others.        [place]    Nerve / Sites Muscle Latency Ref. Amplitude Ref. Rel Amp Segments Distance Velocity Ref. Area    ms ms mV mV %  cm m/s m/s mVms  L Peroneal - EDB     Ankle EDB 3.5 ?6.5 1.3 ?2.0 100 Ankle - EDB 9   3.9     Fib head EDB 9.6  0.5  37.1 Fib head - Ankle 27 44 ?44 2.5     Pop fossa EDB 11.9  1.2  248 Pop fossa - Fib head 10 44 ?44 4.1         Pop fossa - Ankle      R Peroneal - EDB     Ankle EDB 3.8 ?6.5 0.9 ?2.0 100 Ankle - EDB 9   4.2     Fib head EDB 9.9  0.5  56.5 Fib head - Ankle 27 44 ?44 1.6     Pop fossa EDB 12.2  0.6  110 Pop fossa - Fib head 10 44 ?44 2.8         Pop fossa - Ankle      L Tibial - AH     Ankle AH 4.3 ?5.8 10.6 ?4.0 100 Ankle - AH 9   22.4     Pop fossa AH 12.7  3.2  29.9 Pop fossa - Ankle 35 41 ?41 7.7  L Peroneal - Tib Ant     Fib Head Tib Ant NR ?4.7 NR ?3.0 NR Fib Head - Tib Ant  10 NR  NR  R Peroneal - Tib Ant     Fib Head Tib Ant NR ?4.7 NR ?3.0 NR Fib Head - Tib Ant 10 NR  NR               SNC    Nerve / Sites Rec. Site Peak Lat Ref.  Amp Ref. Segments Distance    ms ms V V  cm  L Sural - Ankle (Calf)     Calf Ankle 3.4 ?4.4 14 ?6 Calf - Ankle 14  L Superficial peroneal - Ankle     Lat leg Ankle 2.7 ?4.4 10 ?6 Lat leg - Ankle 14  R Superficial peroneal - Ankle     Lat leg Ankle 2.5 ?4.4 6 ?6 Lat leg - Ankle 14           F  Wave    Nerve F Lat Ref.   ms ms  L Tibial - AH 45.7 ?56.0       EMG Summary Table    Spontaneous MUAP Recruitment  Muscle IA Fib PSW Fasc Other Amp Dur. Poly Pattern  L. Vastus medialis Normal None None None _______ Normal Normal Normal Normal  L. Tibialis anterior Normal None None None _______ Normal Normal Normal Normal  L. Gastrocnemius (Medial head) Normal None None None _______ Normal Normal Normal Normal  L. Extensor hallucis longus Normal None None None _______ Normal Normal Normal  Normal  L. Peroneus longus Normal None None None _______ Normal Normal Normal Normal  L. Biceps femoris (long head) Normal None None None _______ Normal Normal Normal Normal  L. Biceps femoris (short head) Normal None None None _______ Normal Normal Normal Normal  L. Gluteus maximus Normal None None None _______ Normal Normal Normal Normal  L. Gluteus medius Normal None None None _______ Normal Normal Normal Normal

## 2020-08-14 NOTE — Telephone Encounter (Signed)
We received another PA for Ajovy. I called the patient.  She is actually no longer taking the Ajovy but has switched back to Aimovig 140 mg.  PA canceled. This was also taken off her med list and Aimovig was placed back on there as a historical med.

## 2020-08-14 NOTE — Addendum Note (Signed)
Addended by: Gildardo Griffes on: 08/14/2020 11:02 AM   Modules accepted: Orders

## 2020-11-06 ENCOUNTER — Ambulatory Visit: Payer: BC Managed Care – PPO | Admitting: Family Medicine

## 2020-11-20 ENCOUNTER — Telehealth: Payer: BC Managed Care – PPO | Admitting: Neurology

## 2020-12-04 ENCOUNTER — Telehealth: Payer: Self-pay | Admitting: Neurology

## 2020-12-04 NOTE — Telephone Encounter (Signed)
Pt called, last Thursday had a migraine, vision was fuzzy. Would like a call from the nurse to discuss a sooner appt.

## 2020-12-04 NOTE — Telephone Encounter (Signed)
LMVM for pt that returned call, opening with MM/NP for migraines at 12-05-20 1130 if that works for her when she call back.

## 2020-12-04 NOTE — Telephone Encounter (Signed)
FYI Pt called back and accepted the appointment for 9-28 and is aware to check in at 11:00

## 2020-12-04 NOTE — Telephone Encounter (Signed)
Noted  

## 2020-12-05 ENCOUNTER — Encounter: Payer: Self-pay | Admitting: Adult Health

## 2020-12-05 ENCOUNTER — Ambulatory Visit (INDEPENDENT_AMBULATORY_CARE_PROVIDER_SITE_OTHER): Payer: BC Managed Care – PPO | Admitting: Adult Health

## 2020-12-05 VITALS — BP 125/81 | Temp 82.0°F | Ht 64.0 in

## 2020-12-05 DIAGNOSIS — G43709 Chronic migraine without aura, not intractable, without status migrainosus: Secondary | ICD-10-CM

## 2020-12-05 MED ORDER — ERENUMAB-AOOE 140 MG/ML ~~LOC~~ SOAJ: 140.0000 mg | SUBCUTANEOUS | 11 refills | Status: DC

## 2020-12-05 MED ORDER — AIMOVIG 140 MG/ML ~~LOC~~ SOAJ: 140.0000 mg | SUBCUTANEOUS | 0 refills | Status: AC

## 2020-12-05 NOTE — Progress Notes (Signed)
PATIENT: Amber Moss DOB: 1975-07-22  REASON FOR VISIT: follow up HISTORY FROM: patient PRIMARY NEUROLOGIST: Dr. Jaynee Eagles   HISTORY OF PRESENT ILLNESS: Today 12/05/20 Amber Moss is a 45 y.o. 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 She is here today for a follow up on her migraines.  Patient reports insurance is denying Aimovig due to patient taking Nurtec, Nurtec is working well for her migraines. She was unable to get Aimovig this month due to insurance.  Patient is having migraines almost daily. Last Thursday she had a migraine precipitated by an aura, this migraine was severe requiring her to take the Nurtec and Phenergan, she stood up to use the restroom and had a blackout episode including both visual fields followed by blurry vision in both eyes lasting several hours. Her migraines typically start behind left eye.  She is currently having a migraine behind the left eye and radiating through the parietal region. She rates this as a 4 on a 1 to 10 pain scale. Previously patient was only taking Nurtec every other day as described in the instructions inside the medication box. Educated the patient that since this is an abortive medication she is able to take it daily as needed. Plans to see opthalmology tomorrow.  HISTORY Copied from Dr. Cathren Laine note  History: This is a patient with low back pain, paresthesias and history of surgery.  She saw Dr. Kathyrn Sheriff at [facility] in the past, she underwent left-sided L4-L5 laminotomy and microdiscectomy in August 2019, with good improvement in her left leg but then had recurrence of her pain, follow-up imaging demonstrated  recurrent disc herniation in the same area and she elected to proceed with surgical decompression and fusion which was done in October 2019.  She followed up with Dr. Kathyrn Sheriff in 2020 complaining of right-sided back and leg pain and recurrent left-sided leg pain.  Another MRI was completed in April 2020 and by his review there was good surgical decompression without any identifiable stenosis at L4-L5 however at L5-S1 there was a disc desiccation, fairly broad-based right eccentric disc bulge with resultant primary lateral recess stenosis now at the L5-S1 level.  When I saw her initially in July 2020 and reviewed her MRI it indeed showed PLIF at L4-L5 without residual or recurrent stenosis but showed a broad-based right subarticular disc protrusion at L5-S1 impinging upon the descending right S1 nerve root in the right lateral recess.  Symptoms on the left thought to be residual from nerve trapped meant status post surgery and symptoms on the right thought to be ongoing S1 nerve impingement and she was referred back to Dr. Kathyrn Sheriff. MRI in November 2021 showed a dorsal fluid collection partially effacing the thecal sac at the L5 vertebral body with moderate canal narrowing but no significant disc herniation spinal canal stenosis or neuroforaminal narrowing at the remaining lumbar levels and she had already followed up with neurosurgery about these MRI findings. We also recommended weight loss.  Patient was seen again in my clinic in March of this year 2022 with complaints mostly of left leg swelling and cramping since the surgeries.    I advised her that swelling not very common for primary radiculopathy etiology and we sent her to vascular surgery  for more imaging.  She is back today with complaints of left leg problems, denies any right leg issues currently. Patient states today since 20-Jun-2017 her left leg feels "dead", starts in the low back and radiates down the back of the thigh to the back of the leg and bottom  of the foot and top of the toes. No tenderness at the fibular head. She has a lot of cramping in the left leg in the calf and she can only feel her big toe. Focused exam shows symmetrical 1+ AJs, left leg extension/flexion weakness and mild left dorsiflexion weakness.  Decreased pin prick proximally and distally in L5/S1 but has more feeling top of great right toe without pain on palpation at the fibular head. Worsening low backpain and numbness, last mri 01/2020 can repeat MRI lumbar spine and also send to Dr. Wynelle Link to ensure no peroneal entrapment at the knees. Arms are not involved and sensory changes definitely in a radicular pattern so do not suspect any other more proximal central involvement.    As far as migraines go, triptan did not work, hasn't tried the nurtec yet, back on the Aimovig(no side effects), will follow up in a few months. We discussed nurtec qod, qulipta, botox, Roselyn Meier will follow up with Shaniya Tashiro/amy in 3 months.    - Suspect bilateral peroneal motor and sensory conduction abnormalities are technical due to morbid obesity and large leg because right leg is uninvoled and clinical symptoms do not entirely correlate but will xr the left knee and send to ortho for evaluation of possible peroneal neuropathy. Will repeat MRI lumbar spine due to worsening left leg weakness and sensory changes in L5/S1 radicular pattern. Continue to lose weight, she is on Ozempic now, recommended [facility] but she lives in [place] and that is too far, continue with primary care.   REVIEW OF SYSTEMS: Out of a complete 14 system review of symptoms, the patient complains only of the following symptoms, and all other reviewed systems are negative.  ALLERGIES: Allergies  Allergen Reactions   Doxycycline Hives, Diarrhea and Nausea And Vomiting   Keflex [Cephalexin] Hives, Diarrhea and Nausea And Vomiting   Baclofen Other (See Comments)    Migraine   Vancomycin Itching    HOME  MEDICATIONS: Outpatient Medications Prior to Visit  Medication Sig Dispense Refill   citalopram (CELEXA) 40 MG tablet Take 40 mg by mouth daily.     Erenumab-aooe 140 MG/ML SOAJ Inject 140 mg into the skin every 30 (thirty) days.     ibuprofen (ADVIL) 800 MG tablet Take 800 mg by mouth daily as needed for mild pain or moderate pain.      indomethacin (INDOCIN) 50 MG capsule Take 50 mg by mouth 2 (two) times daily with a meal.     meclizine (ANTIVERT) 25 MG tablet Take 25 mg by mouth 3 (three) times daily as needed.     pantoprazole (PROTONIX) 40 MG tablet Take 40 mg by mouth daily as needed (reflux).      promethazine (PHENERGAN) 25 MG tablet Take 25 mg by mouth every 8 (eight) hours.     rizatriptan (MAXALT) 10 MG tablet Take 10 mg by mouth as needed for migraine.      rOPINIRole (REQUIP) 0.25 MG tablet Take 0.25 mg by mouth at bedtime as needed (restless leg).      SYNTHROID 300 MCG tablet Take 300 mcg by mouth daily before breakfast.     traMADol (ULTRAM) 50 MG  tablet Take 50 mg by mouth 3 (three) times daily as needed for moderate pain.      triamcinolone (KENALOG) 0.1 % SMARTSIG:1 Application Topical 2-3 Times Daily     venlafaxine (EFFEXOR) 37.5 MG tablet Take 37.5 mg by mouth 2 (two) times daily.     No facility-administered medications prior to visit.    PAST MEDICAL HISTORY: Past Medical History:  Diagnosis Date   Arthritis    back   Depression    Easy bruising    Fatty liver    GERD (gastroesophageal reflux disease)    Hypercholesterolemia    diet controlled, no med   Hypothyroidism    Migraines    Polycystic ovarian disease    PONV (postoperative nausea and vomiting)     PAST SURGICAL HISTORY: Past Surgical History:  Procedure Laterality Date   ABLATION     cervical ablation at MD's Office   BACK SURGERY  12/2017, 11/22/19   CARPAL TUNNEL RELEASE Bilateral    CHOLECYSTECTOMY  04/2007   Dr. Lindalou Hose   DILATION AND CURETTAGE OF UTERUS  12/2011   EYE SURGERY  Bilateral    Lasik   LUMBAR LAMINECTOMY/DECOMPRESSION MICRODISCECTOMY Left 10/22/2017   Procedure: LUMBAR - FIVE LAMINOTOMY AND MICRODISCECTOMY;  Surgeon: Consuella Lose, MD;  Location: [facility];  Service: Neurosurgery;  Laterality: Left;   LUMBAR - FIVE LAMINOTOMY AND MICRODISCECTOMY    FAMILY HISTORY: Family History  Problem Relation Age of Onset   Thyroid disease Maternal Grandfather    Hypertension Mother    Hyperlipidemia Father    Hypertension Father    Diabetes Mellitus II Father    Headache Maternal Grandmother        ?migraines    SOCIAL HISTORY: Social History   Socioeconomic History   Marital status: Married    Spouse name: Not on file   Number of children: 2   Years of education: Not on file   Highest education level: Not on file  Occupational History   Not on file  Tobacco Use   Smoking status: Never   Smokeless tobacco: Never  Vaping Use   Vaping Use: Never used  Substance and Sexual Activity   Alcohol use: Never   Drug use: Never   Sexual activity: Not Currently    Comment: ablation  Other Topics Concern   Not on file  Social History Narrative   Lives at home with spouse    1 Biological child, 1 adopted   Right handed    Caffeine: 1 cup with dinner (soda or tea)    Social Determinants of Health   Financial Resource Strain: Not on file  Food Insecurity: Not on file  Transportation Needs: Not on file  Physical Activity: Not on file  Stress: Not on file  Social Connections: Not on file  Intimate Partner Violence: Not on file      PHYSICAL EXAM  There were no vitals filed for this visit. There is no height or weight on file to calculate BMI.  Generalized: Well developed, in no acute distress   Neurological examination  Mentation: Alert oriented to time, place, history taking. Follows all commands speech and language fluent Cranial nerve II-XII: Pupils were equal round reactive to light. Extraocular movements were full, visual field were  full on confrontational test. Facial sensation and strength were normal. Uvula tongue midline. Head turning and shoulder shrug  were normal and symmetric. Motor: The motor testing reveals 5 over 5 strength in bilateral upper extremities and her right lower  extremity and a 4 over 5 strength in her left lower extremity. Good symmetric motor tone is noted throughout.  Sensory: Sensory testing is intact to soft touch on all 4 extremities. No evidence of extinction is noted.  Coordination: Cerebellar testing reveals good finger-nose-finger and heel-to-shin bilaterally.  Gait and station: Gait is normal. Tandem gait is normal. Romberg is negative. No drift is seen.  Reflexes: Deep tendon reflexes are symmetric and normal bilaterally.   DIAGNOSTIC DATA (LABS, IMAGING, TESTING) - I reviewed patient records, labs, notes, testing and imaging myself where available.  Lab Results  Component Value Date   WBC 12.4 (H) 02/10/2020   HGB 12.1 02/10/2020   HCT 35.2 (L) 02/10/2020   MCV 88.9 02/10/2020   PLT 241 02/10/2020      Component Value Date/Time   NA 136 02/10/2020 0139   K 4.4 02/10/2020 0139   CL 103 02/10/2020 0139   CO2 22 02/10/2020 0139   GLUCOSE 197 (H) 02/10/2020 0139   BUN 6 02/10/2020 0139   CREATININE 0.86 02/10/2020 0139   CALCIUM 8.9 02/10/2020 0139   PROT 6.4 (L) 01/27/2020 0053   ALBUMIN 3.5 01/27/2020 0053   AST 32 01/27/2020 0053   ALT 27 01/27/2020 0053   ALKPHOS 51 01/27/2020 0053   BILITOT 0.8 01/27/2020 0053   GFRNONAA >60 02/10/2020 0139   GFRAA >60 12/11/2017 0856   Lab Results  Component Value Date   CHOL 258 (H) 01/27/2020   HDL 43 01/27/2020   LDLCALC 195 (H) 01/27/2020   TRIG 100 01/27/2020   CHOLHDL 6.0 01/27/2020   Lab Results  Component Value Date   HGBA1C 5.7 (H) 01/26/2020   No results found for: VITAMINB12 Lab Results  Component Value Date   TSH 2.313 01/26/2020      ASSESSMENT AND PLAN 45 y.o. year old female  has a past medical history  of Arthritis, Depression, Easy bruising, Fatty liver, GERD (gastroesophageal reflux disease), Hypercholesterolemia, Hypothyroidism, Migraines, Polycystic ovarian disease, and PONV (postoperative nausea and vomiting). here with  Migraine:  - Coninue Nurtec 75 mg daily as needed for migraines.  - New order placed for Aimovig, patient sent with 2 samples today. - if insurance denies Aimovig this time and we will move towards trying Botox injections for your migraines. - Follow up in 6 months or sooner if needed    Ward Givens, MSN, NP-C 12/05/2020, 11:11 AM [facility] [address] [place], [place] [address] [phone]

## 2020-12-06 ENCOUNTER — Telehealth: Payer: Self-pay | Admitting: Adult Health

## 2020-12-06 NOTE — Telephone Encounter (Signed)
I called pt and relayed the message per MM/NP after discussing the episode with Dr. Jaynee Eagles, since briefly lost vision in both eyes feel vision most likely consistent with an aura, no need to repeat MRI unless pt wishes.  She is ok to hold off for now since only one episode.  She will call back if happens again. She appreciated call back.

## 2020-12-06 NOTE — Telephone Encounter (Signed)
Returned Amber Moss's call. Pt saw Dr Shanon Brow (?) today. They do recommend patient gets a repeat MRI. Amber Moss stated today the visual workup today was normal, healthy eyes.

## 2020-12-06 NOTE — Telephone Encounter (Signed)
Amber Moss from [facility] called stating pt's migraine symptoms are worsening to the point where the pt is having black outs and it is causing a concern. Amber Moss is requesting a call back at [phone] or she states you may speak with Joycelyn Schmid.

## 2020-12-06 NOTE — Telephone Encounter (Signed)
Please let the patient know that I discussed with Dr. Lavell Anchors.  She felt that since the patient briefly lost vision in both eyes most likely consistent with an aura and repeat MRI was not needed.  However if the patient would like this repeated I am happy to place order

## 2021-04-03 ENCOUNTER — Ambulatory Visit: Payer: BC Managed Care – PPO | Admitting: Neurology

## 2021-04-17 ENCOUNTER — Other Ambulatory Visit (HOSPITAL_COMMUNITY): Payer: Self-pay | Admitting: Surgery

## 2021-04-17 ENCOUNTER — Other Ambulatory Visit: Payer: Self-pay | Admitting: Surgery

## 2021-05-02 ENCOUNTER — Encounter: Payer: BC Managed Care – PPO | Attending: Surgery | Admitting: Skilled Nursing Facility1

## 2021-05-02 ENCOUNTER — Other Ambulatory Visit: Payer: Self-pay

## 2021-05-02 DIAGNOSIS — Z713 Dietary counseling and surveillance: Secondary | ICD-10-CM | POA: Diagnosis not present

## 2021-05-02 DIAGNOSIS — Z6841 Body Mass Index (BMI) 40.0 and over, adult: Secondary | ICD-10-CM | POA: Diagnosis not present

## 2021-05-02 DIAGNOSIS — E669 Obesity, unspecified: Secondary | ICD-10-CM

## 2021-05-02 NOTE — Progress Notes (Signed)
Nutrition Assessment for Bariatric Surgery Medical Nutrition Therapy Appt Start Time: 3:00    End Time: 3:50  Patient was seen on 05/02/2021 for Pre-Operative Nutrition Assessment. Letter of approval faxed to [facility] bariatric surgery program coordinator on 05/02/2021.   Referral stated Supervised Weight Loss (SWL) visits needed: 0  Pt completed visits.   Pt has cleared nutrition requirements.    Planned surgery: sleeve gastrectomy  Pt expectation of surgery: to be healthier  Pt expectation of dietitian: none identified     NUTRITION ASSESSMENT   Anthropometrics  Start weight at [facility]: 316.8 lbs (date: 05/02/21)  Height: 64 in BMI: 54.38 kg/m2     Clinical  Medical hx: PCOS, depression, hyperlipidemia, GERD, kidney stone Medications: Protonix: see list  Labs: none updated in EMR Notable signs/symptoms: migraines Any previous deficiencies? Yes: vitamin D, folate, iron  Micronutrient Nutrition Focused Physical Exam: Hair: No issues observed Eyes: No issues observed Mouth: No issues observed Neck: No issues observed Nails: No issues observed Skin: No issues observed  Lifestyle & Dietary Hx  Pt states she preps her meals.    24-Hr Dietary Recall First Meal: oatmeal Snack:  Second Meal: chicken rollups + cream cheese Snack:  Third Meal: baked potato + butter + sdour cream + salt + pepper + chives + cheese Snack:  Beverages: water, sweet tea, soda, coffee  Estimated Energy Needs Calories: 1500   NUTRITION DIAGNOSIS  Overweight/obesity (Cadiz-3.3) related to past poor dietary habits and physical inactivity as evidenced by patient w/ planned sleeve surgery following dietary guidelines for continued weight loss.    NUTRITION INTERVENTION  Nutrition counseling (C-1) and education (E-2) to facilitate bariatric surgery goals.  Educated pt on micronutrient deficiencies post surgery and strategies to mitigate that risk Educated pt on label  reading Educated pt on saturated verses unsaturated fat   Pre-Op Goals Reviewed with the Patient Track food and beverage intake (pen and paper, MyFitness Pal, Baritastic app, etc.) Make healthy food choices while monitoring portion sizes Consume 3 meals per day or try to eat every 3-5 hours Avoid concentrated sugars and fried foods Keep sugar & fat in the single digits per serving on food labels Practice CHEWING your food (aim for applesauce consistency) Practice not drinking 15 minutes before, during, and 30 minutes after each meal and snack Avoid all carbonated beverages (ex: soda, sparkling beverages)  Limit caffeinated beverages (ex: coffee, tea, energy drinks) Avoid all sugar-sweetened beverages (ex: regular soda, sports drinks)  Avoid alcohol  Aim for 64-100 ounces of FLUID daily (with at least half of fluid intake being plain water)  Aim for at least 60-80 grams of PROTEIN daily Look for a liquid protein source that contains ?15 g protein and ?5 g carbohydrate (ex: shakes, drinks, shots) Make a list of non-food related activities Physical activity is an important part of a healthy lifestyle so keep it moving! The goal is to reach 150 minutes of exercise per week, including cardiovascular and weight baring activity.  *Goals that are bolded indicate the pt would like to start working towards these  Handouts Provided Include  Bariatric Surgery handouts (Nutrition Visits, Pre-Op Goals, Protein Shakes, Vitamins & Minerals)  Learning Style & Readiness for Change Teaching method utilized: Visual & Auditory  Demonstrated degree of understanding via: Teach Back  Readiness Level: action Barriers to learning/adherence to lifestyle change: none identified    MONITORING & EVALUATION Dietary intake, weekly physical activity, body weight, and pre-op goals reached at next nutrition visit.  Next Steps  Patient is to follow up at [place] for Pre-Op Class >2 weeks before surgery for further  nutrition education.   Pt has completed visits. No further supervised visits required/recomended

## 2021-05-13 ENCOUNTER — Encounter: Payer: BC Managed Care – PPO | Attending: Surgery | Admitting: Skilled Nursing Facility1

## 2021-05-13 ENCOUNTER — Other Ambulatory Visit: Payer: Self-pay

## 2021-05-13 DIAGNOSIS — E669 Obesity, unspecified: Secondary | ICD-10-CM | POA: Diagnosis not present

## 2021-05-13 NOTE — Progress Notes (Signed)
Pre-Operative Nutrition Class:   ? ?Patient was seen on 05/13/2021 for Pre-Operative Bariatric Surgery Education at [facility]   ? ?Surgery date:  ?Surgery type: sleeve ?Start weight at [facility]: 316.8 ?Weight today: 317 ? ?Samples given per MNT protocol. Patient educated on appropriate usage: ? Bariatric Advantage Multivitamin ?Lot # [id number] ?Exp:10/23 ? ?Ensure max  Shake ?Lot # [id number] ?Exp: 06/08/2021 ? ? ?The following the learning objectives were met by the patient during this course: ?Identify Pre-Op Dietary Goals and will begin 2 weeks pre-operatively ?Identify appropriate sources of fluids and proteins  ?State protein recommendations and appropriate sources pre and post-operatively ?Identify Post-Operative Dietary Goals and will follow for 2 weeks post-operatively ?Identify appropriate multivitamin and calcium sources ?Describe the need for physical activity post-operatively and will follow MD recommendations ?State when to call healthcare provider regarding medication questions or post-operative complications ?When having a diagnosis of diabetes understanding hypoglycemia symptoms and the inclusion of 1 complex carbohydrate per meal ? ?Handouts given during class include: ?Pre-Op Bariatric Surgery Diet Handout ?Protein Shake Handout ?Post-Op Bariatric Surgery Nutrition Handout ?[facility] Program Information Flyer ?Support Group Information Flyer ?[facility] Bariatric Supplements Price List ? ?Follow-Up Plan: ?Patient will follow-up at [facility] 2 weeks post operatively for diet advancement per MD. ? ?

## 2021-05-15 ENCOUNTER — Ambulatory Visit: Payer: Self-pay | Admitting: Surgery

## 2021-05-15 NOTE — H&P (View-Only) (Signed)
?Amber Moss ?[id number]  ? ?Referring Provider: Self ? ?Subjective  ? ?Chief Complaint: New Bariatric Consult ? ? ?History of Present Illness: ?Very pleasant 46 year old woman with medical problems listed below who presents for consultation regarding surgical treatment of morbid obesity. She suffers with migraines as well as significant orthopedic issues and back pain (had had previous back surgery x4 and now has left lower extremity radiculopathy related to this) for which she does take indomethacin. Also struggles with PCOS.  ?Previous attempts at weight loss have included Adipex, Saxenda, Rybelsus, weight watchers, Slim fast, high-protein diet, and most recently the keto diet. With each of these attempts she will lose between 12 and 25 pounds but ultimately regains the weight she has lost. ?Currently she does meal prepping each week and adheres to this fairly well. Reports her dietary choices are well-balanced. She also walks over 10,000 steps every day at work, but does not do much other exercise. ? ?Previous abdominal surgery includes cholecystectomy ? ?She is a CMA at [employer]. She has 2 kids at home, age 52 and 62 as well as her husband. ?Denies any tobacco, drug or alcohol use. ? ?Review of Systems: ?A complete review of systems was obtained from the patient. I have reviewed this information and discussed as appropriate with the patient. See HPI as well for other ROS. ? ?Medical History: ?Past Medical History:  ?Diagnosis Date  ? GERD (gastroesophageal reflux disease)  ? Hyperlipidemia  ? Thyroid disease  ? ?There is no problem list on file for this patient. ? ?Past Surgical History:  ?Procedure Laterality Date  ? Lumbar - Five Laminotomy and Microdiscectomy 10/22/2017  ?Dr. Ermalene Moss  ? Posterior Lumbar Interbody Fusion Lumbar Four - Lumbar - Five 12/18/2017  ?Dr. Kathyrn Moss  ? Wound Exploration; Hardware Revision Lumbar Five - Sacral One 02/09/2020  ?Dr. Kathyrn Moss  ? ? ?Allergies  ?Allergen Reactions  ?  Cephalexin Diarrhea, Hives and Nausea And Vomiting  ?Patient states she has had this medication recently and had no reaction. ? ? Doxycycline Diarrhea, Hives and Nausea And Vomiting  ?Pt states she has had this medication recently and had no reaction  ? ? Baclofen Other (See Comments)  ?Migraine  ? Vancomycin Itching  ? ?Current Outpatient Medications on File Prior to Visit  ?Medication Sig Dispense Refill  ? erenumab-aooe (AIMOVIG AUTOINJECTOR) 140 mg/mL AtIn Inject subcutaneously  ? CHOLECALCIFEROL 25 mcg (1,000 unit) tablet Take 2,000 Units by mouth once daily  ? escitalopram oxalate (LEXAPRO) 10 MG tablet Take 10 mg by mouth once daily  ? folic acid (FOLVITE) 1 MG tablet Take 1,000 mcg by mouth once daily  ? levothyroxine (SYNTHROID) 75 MCG tablet TAKE 1 TABLET BY MOUTH EVERY DAY, take WITH THE 200 mcg EVERY DAY  ? meloxicam (MOBIC) 15 MG tablet  ? metFORMIN (GLUCOPHAGE) 500 MG tablet Take 1,000 mg by mouth 2 (two) times daily  ? NURTEC ODT 75 mg disintegrating tablet Take 1 tablet by mouth as directed  ? SYNTHROID 200 mcg tablet TAKE 1 TABLET BY MOUTH EVERY MORNING WITH SYNTHROID 75MCG  ? topiramate (TOPAMAX) 25 MG tablet Take 100 mg by mouth once daily  ? ?No current facility-administered medications on file prior to visit.  ? ?Family History  ?Problem Relation Age of Onset  ? Hyperlipidemia (Elevated cholesterol) Mother  ? High blood pressure (Hypertension) Mother  ? Diabetes Father  ? High blood pressure (Hypertension) Father  ? Hyperlipidemia (Elevated cholesterol) Father  ? Thyroid disease Maternal Grandfather  ? ? ?Social History  ? ?  Tobacco Use  ?Smoking Status Never  ?Smokeless Tobacco Never  ? ? ?Social History  ? ?Socioeconomic History  ? Marital status: Married  ?Tobacco Use  ? Smoking status: Never  ? Smokeless tobacco: Never  ?Substance and Sexual Activity  ? Alcohol use: Never  ? Drug use: Never  ? ?Objective:  ? ?Vitals:  ?03/29/21 1416  ?BP: (!) 148/88  ?Pulse: 106  ?Temp: 36.6 ?C (97.9 ?F)   ?SpO2: 98%  ?Weight: (!) 139.7 kg (308 lb)  ?Height: 162.6 cm ('5\' 4"'$ )  ? ?Body mass index is 52.87 kg/m?. ? ?Alert, well-appearing ?Unlabored respiration ? ?Assessment and Plan:  ?Diagnoses and all orders for this visit: ? ?Morbid obesity (CMS-HCC) ?- CBC, No Differential/Platelet - [facility] ?- Basic Metabolic Panel (BMP) ?- Lipid Panel - [facility] ?- T4+Free T4 - [facility] ?- Thyroid Stimulating-Hormone (TSH) ?- H. pylori Breath Test - [facility] ?- Iron and Total Iron Binding Capacity (TIBC) ?- Vitamin B12 - [facility] ?- Folate ?- Hemoglobin A1C ?- Prothrombin Time (INR) ?- Vitamin D, 25-Hydroxy - [facility] ?- Ambulatory Referral to Nutrition ?- X-ray chest PA and lateral ?- X-ray upper GI ?- ECG 12-lead ?- Ambulatory Referral to Adult Behavioral Health ? ?Arthritis ? ?History of depression ? ?Nonalcoholic hepatosteatosis ? ?Gastroesophageal reflux disease, unspecified whether esophagitis present ?Comments: ?Symptoms are intermittent. Not taking any regular medication to treat this. Feels it was related to another medication she was taking. ? ?Hypercholesteremia ? ?Hypothyroidism, unspecified type ? ?Other migraine without status migrainosus, not intractable ? ?PCOS (polycystic ovarian syndrome) ? ?Lumbar radiculopathy ? ?Edema of left lower extremity ? ? ? ?She is a good candidate for sleeve gastrectomy. We discussed the surgery including technical aspects, the risks of bleeding, infection, pain, scarring, injury to intra-abdominal structures, staple line leak or abscess, chronic abdominal pain or nausea, new onset or worsened GERD, DVT/PE, pneumonia, heart attack, stroke, death, failure to reach weight loss goals and weight regain, hernia. Discussed the typical pre-, peri-, and postoperative course. Discussed the importance of lifelong behavioral changes to combat the chronic and relapsing disease which is obesity. Questions welcomed and answered. We will initiate the bariatric pathway. ? ?Amber Holzer Raquel James, MD  ?

## 2021-05-15 NOTE — H&P (Signed)
?Amber Moss ?[id number]  ? ?Referring Provider: Self ? ?Subjective  ? ?Chief Complaint: New Bariatric Consult ? ? ?History of Present Illness: ?Very pleasant 46 year old woman with medical problems listed below who presents for consultation regarding surgical treatment of morbid obesity. She suffers with migraines as well as significant orthopedic issues and back pain (had had previous back surgery x4 and now has left lower extremity radiculopathy related to this) for which she does take indomethacin. Also struggles with PCOS.  ?Previous attempts at weight loss have included Adipex, Saxenda, Rybelsus, weight watchers, Slim fast, high-protein diet, and most recently the keto diet. With each of these attempts she will lose between 12 and 25 pounds but ultimately regains the weight she has lost. ?Currently she does meal prepping each week and adheres to this fairly well. Reports her dietary choices are well-balanced. She also walks over 10,000 steps every day at work, but does not do much other exercise. ? ?Previous abdominal surgery includes cholecystectomy ? 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 ?Denies any tobacco, drug or alcohol use. ? ?Review of Systems: ?A complete review of systems was obtained from the patient. I have reviewed this information and discussed as appropriate with the patient. See HPI as well for other ROS. ? ?Medical History: ?Past Medical History:  ?Diagnosis Date  ? GERD (gastroesophageal reflux disease)  ? Hyperlipidemia  ? Thyroid disease  ? ?There is no problem list on file for this patient. ? ?Past Surgical History:  ?Procedure Laterality Date  ? Lumbar - Five Laminotomy and Microdiscectomy 10/22/2017  ?Dr. Ermalene Postin  ? Posterior Lumbar Interbody Fusion Lumbar Four - Lumbar - Five 12/18/2017  ?Dr. Kathyrn Sheriff  ? Wound Exploration; Hardware Revision Lumbar Five - Sacral One 02/09/2020  ?Dr. Kathyrn Sheriff  ? ? ?Allergies  ?Allergen Reactions  ?  Cephalexin Diarrhea, Hives and Nausea And Vomiting  ?Patient states she has had this medication recently and had no reaction. ? ? Doxycycline Diarrhea, Hives and Nausea And Vomiting  ?Pt states she has had this medication recently and had no reaction  ? ? Baclofen Other (See Comments)  ?Migraine  ? Vancomycin Itching  ? ?Current Outpatient Medications on File Prior to Visit  ?Medication Sig Dispense Refill  ? erenumab-aooe (AIMOVIG AUTOINJECTOR) 140 mg/mL AtIn Inject subcutaneously  ? CHOLECALCIFEROL 25 mcg (1,000 unit) tablet Take 2,000 Units by mouth once daily  ? escitalopram oxalate (LEXAPRO) 10 MG tablet Take 10 mg by mouth once daily  ? folic acid (FOLVITE) 1 MG tablet Take 1,000 mcg by mouth once daily  ? levothyroxine (SYNTHROID) 75 MCG tablet TAKE 1 TABLET BY MOUTH EVERY DAY, take WITH THE 200 mcg EVERY DAY  ? meloxicam (MOBIC) 15 MG tablet  ? metFORMIN (GLUCOPHAGE) 500 MG tablet Take 1,000 mg by mouth 2 (two) times daily  ? NURTEC ODT 75 mg disintegrating tablet Take 1 tablet by mouth as directed  ? SYNTHROID 200 mcg tablet TAKE 1 TABLET BY MOUTH EVERY MORNING WITH SYNTHROID 75MCG  ? topiramate (TOPAMAX) 25 MG tablet Take 100 mg by mouth once daily  ? ?No current facility-administered medications on file prior to visit.  ? ?Family History  ?Problem Relation Age of Onset  ? Hyperlipidemia (Elevated cholesterol) Mother  ? High blood pressure (Hypertension) Mother  ? Diabetes Father  ? High blood pressure (Hypertension) Father  ? Hyperlipidemia (Elevated cholesterol) Father  ? Thyroid disease Maternal Grandfather  ? ? ?Social History  ? ?  Tobacco Use  ?Smoking Status Never  ?Smokeless Tobacco Never  ? ? ?Social History  ? ?Socioeconomic History  ? Marital status: Married  ?Tobacco Use  ? Smoking status: Never  ? Smokeless tobacco: Never  ?Substance and Sexual Activity  ? Alcohol use: Never  ? Drug use: Never  ? ?Objective:  ? ?Vitals:  ?03/29/21 1416  ?BP: (!) 148/88  ?Pulse: 106  ?Temp: 36.6 ?C (97.9 ?F)   ?SpO2: 98%  ?Weight: (!) 139.7 kg (308 lb)  ?Height: 162.6 cm ('5\' 4"'$ )  ? ?Body mass index is 52.87 kg/m?. ? ?Alert, well-appearing ?Unlabored respiration ? ?Assessment and Plan:  ?Diagnoses and all orders for this visit: ? ?Morbid obesity (CMS-HCC) ?- CBC, No Differential/Platelet - [facility] ?- Basic Metabolic Panel (BMP) ?- Lipid Panel - [facility] ?- T4+Free T4 - [facility] ?- Thyroid Stimulating-Hormone (TSH) ?- H. pylori Breath Test - [facility] ?- Iron and Total Iron Binding Capacity (TIBC) ?- Vitamin B12 - [facility] ?- Folate ?- Hemoglobin A1C ?- Prothrombin Time (INR) ?- Vitamin D, 25-Hydroxy - [facility] ?- Ambulatory Referral to Nutrition ?- X-ray chest PA and lateral ?- X-ray upper GI ?- ECG 12-lead ?- Ambulatory Referral to Adult Behavioral Health ? ?Arthritis ? ?History of depression ? ?Nonalcoholic hepatosteatosis ? ?Gastroesophageal reflux disease, unspecified whether esophagitis present ?Comments: ?Symptoms are intermittent. Not taking any regular medication to treat this. Feels it was related to another medication she was taking. ? ?Hypercholesteremia ? ?Hypothyroidism, unspecified type ? ?Other migraine without status migrainosus, not intractable ? ?PCOS (polycystic ovarian syndrome) ? ?Lumbar radiculopathy ? ?Edema of left lower extremity ? ? ? ?She is a good candidate for sleeve gastrectomy. We discussed the surgery including technical aspects, the risks of bleeding, infection, pain, scarring, injury to intra-abdominal structures, staple line leak or abscess, chronic abdominal pain or nausea, new onset or worsened GERD, DVT/PE, pneumonia, heart attack, stroke, death, failure to reach weight loss goals and weight regain, hernia. Discussed the typical pre-, peri-, and postoperative course. Discussed the importance of lifelong behavioral changes to combat the chronic and relapsing disease which is obesity. Questions welcomed and answered. We will initiate the bariatric pathway. ? ?Tanette Chauca Raquel James, MD  ?

## 2021-05-22 NOTE — Patient Instructions (Addendum)
DUE TO COVID-19 ONLY ONE VISITOR  (aged 45 and older)  IS ALLOWED TO COME WITH YOU AND STAY IN THE WAITING ROOM ONLY DURING PRE OP AND PROCEDURE.   ?**NO VISITORS ARE ALLOWED IN THE SHORT STAY AREA OR RECOVERY ROOM!!** ? ?IF YOU WILL BE ADMITTED INTO THE HOSPITAL YOU ARE ALLOWED ONLY TWO SUPPORT PEOPLE DURING VISITATION HOURS ONLY (7 AM -8PM)   ?The support person(s) must pass our screening, gel in and out, and wear a mask at all times, including in the patient?s room. ?Patients must also wear a mask when staff or their support person are in the room. ?Visitors GUEST BADGE MUST BE WORN VISIBLY  ?One adult visitor may remain with you overnight and MUST be in the room by 8 P.M. ?  ? ? Your procedure is scheduled on: 06/04/21 ? ? Report to [facility] Main Entrance ? ?  Report to admitting at : 12:00 AM ? ? Call this number if you have problems the morning of surgery [phone] ?Eat a light diet the day before surgery.  Examples including soups, broths, toast, yogurt, mashed potatoes.  Things to avoid include carbonated beverages (fizzy beverages), raw fruits and raw vegetables, or beans.  ? ?If your bowels are filled with gas, your surgeon will have difficulty visualizing your pelvic organs which increases your surgical risks.  ?  ?MORNING OF SURGERY DRINK:   DRINK 1 G2 drink BEFORE YOU LEAVE HOME ( 11:00 AM), DRINK ALL OF THE  G2 DRINK AT ONE TIME.   ?NO SOLID FOOD AFTER 600 PM THE NIGHT BEFORE YOUR SURGERY. YOU MAY DRINK CLEAR FLUIDS. THE G2 DRINK YOU DRINK BEFORE YOU LEAVE HOME WILL BE THE LAST FLUIDS YOU DRINK BEFORE SURGERY. ? ?PAIN IS EXPECTED AFTER SURGERY AND WILL NOT BE COMPLETELY ELIMINATED. AMBULATION AND TYLENOL WILL HELP REDUCE INCISIONAL AND GAS PAIN. MOVEMENT IS KEY! ? ?YOU ARE EXPECTED TO BE OUT OF BED WITHIN 4 HOURS OF ADMISSION TO YOUR PATIENT ROOM. ? ?SITTING IN THE RECLINER THROUGHOUT THE DAY IS IMPORTANT FOR DRINKING FLUIDS AND MOVING GAS THROUGHOUT THE GI TRACT. ? ?COMPRESSION STOCKINGS  SHOULD BE WORN [place] UNLESS YOU ARE WALKING.  ? ?INCENTIVE SPIROMETER SHOULD BE USED EVERY HOUR WHILE AWAKE TO DECREASE POST-OPERATIVE COMPLICATIONS SUCH AS PNEUMONIA. ? ?WHEN DISCHARGED HOME, IT IS IMPORTANT TO CONTINUE TO WALK EVERY HOUR AND USE THE INCENTIVE SPIROMETER EVERY HOUR.  ?CLEAR LIQUID DIET: ?Water ?Black Coffee (sugar ok, NO MILK/CREAM OR CREAMERS)  ?Tea (sugar ok, NO MILK/CREAM OR CREAMERS) regular and decaf                             ?Plain Jell-O (NO RED)                                           ?Fruit ices (not with fruit pulp, NO RED)                                     ?Popsicles (NO RED)                                                                  ?  Juice: apple, WHITE grape, WHITE cranberry ?Sports drinks like Gatorade (NO RED) ?Clear broth(vegetable,chicken,beef)       ?Drink Gatorade drink AT :11:00 AM the day of surgery.    ?  ?The day of surgery:  ?Drink ONE (1) Pre-Surgery Clear Ensure or G2 at AM the morning of surgery. Drink in one sitting. Do not sip.  ?This drink was given to you during your hospital  ?pre-op appointment visit. ?Nothing else to drink after completing the  ?Pre-Surgery Clear Ensure or G2. ?  ?       If you have questions, please contact your surgeon?s office. ? ?FOLLOW BOWEL PREP AND ANY ADDITIONAL PRE OP INSTRUCTIONS YOU RECEIVED FROM YOUR SURGEON'S OFFICE!!! ?  ?Oral Hygiene is also important to reduce your risk of infection.                                    ?Remember - BRUSH YOUR TEETH THE MORNING OF SURGERY WITH YOUR REGULAR TOOTHPASTE ? ? Do NOT smoke after Midnight ? ? Take these medicines the morning of surgery with A SIP OF WATER: ESCITALOPRAM,SYNTHROID,PANTOPRAZOLE.USE EYE DROPS AS USUAL. ? ?DO NOT TAKE ANY ORAL DIABETIC MEDICATIONS DAY OF YOUR SURGERY ? ?Bring CPAP mask and tubing day of surgery. ?                  ?           You may not have any metal on your body including hair pins, jewelry, and body piercing ? ?           Do  not wear make-up, lotions, powders, perfumes/cologne, or deodorant ? ?Do not wear nail polish including gel and S&S, artificial/acrylic nails, or any other type of covering on natural nails including finger and toenails. If you have artificial nails, gel coating, etc. that needs to be removed by a nail salon please have this removed prior to surgery or surgery may need to be canceled/ delayed if the surgeon/ anesthesia feels like they are unable to be safely monitored.  ? ?Do not shave  48 hours prior to surgery.  ? ? Do not bring valuables to the hospital. [facility] NOT ?            RESPONSIBLE   FOR VALUABLES. ? ? Contacts, dentures or bridgework may not be worn into surgery. ? ? Bring small overnight bag day of surgery. ?  ? Patients discharged on the day of surgery will not be allowed to drive home.  Someone NEEDS to stay with you for the first 24 hours after anesthesia. ? ? Special Instructions: Bring a copy of your healthcare power of attorney and living will documents         the day of surgery if you haven't scanned them before. ? ?            Please read over the following fact sheets you were given: IF [place] [phone] ? ?   [facility] - Preparing for Surgery ?Before surgery, you can play an important role.  Because skin is not sterile, your skin needs to be as free of germs as possible.  You can reduce the number of germs on your skin by washing with CHG (chlorahexidine gluconate) soap before surgery.  CHG is an antiseptic cleaner which kills germs and bonds with the skin to  continue killing germs even after washing. ?Please DO NOT use if you have an allergy to CHG or antibacterial soaps.  If your skin becomes reddened/irritated stop using the CHG and inform your nurse when you arrive at Short Stay. ?Do not shave (including legs and underarms) for at least 48 hours prior to the first CHG shower.  You may shave your face/neck. ?Please follow  these instructions carefully: ? 1.  Shower with CHG Soap the night before surgery and the  morning of Surgery. ? 2.  If you choose to wash your hair, wash your hair first as usual with your  normal  shampoo. ? 3.  After you shampoo, rinse your hair and body thoroughly to remove the  shampoo.                           4.  Use CHG as you would any other liquid soap.  You can apply chg directly  to the skin and wash  ?                     Gently with a scrungie or clean washcloth. ? 5.  Apply the CHG Soap to your body ONLY FROM THE NECK DOWN.   Do not use on face/ open      ?                     Wound or open sores. Avoid contact with eyes, ears mouth and genitals (private parts).  ?                     Production manager,  Genitals (private parts) with your normal soap. ?            6.  Wash thoroughly, paying special attention to the area where your surgery  will be performed. ? 7.  Thoroughly rinse your body with warm water from the neck down. ? 8.  DO NOT shower/wash with your normal soap after using and rinsing off  the CHG Soap. ?               9.  Pat yourself dry with a clean towel. ?           10.  Wear clean pajamas. ?           11.  Place clean sheets on your bed the night of your first shower and do not  sleep with pets. ?Day of Surgery : ?Do not apply any lotions/deodorants the morning of surgery.  Please wear clean clothes to the hospital/surgery center. ? ?FAILURE TO FOLLOW THESE INSTRUCTIONS MAY RESULT IN THE CANCELLATION OF YOUR SURGERY ?PATIENT SIGNATURE_________________________________ ? ?NURSE SIGNATURE__________________________________ ? ?________________________________________________________________________  ?

## 2021-05-23 ENCOUNTER — Encounter (HOSPITAL_COMMUNITY)
Admission: RE | Admit: 2021-05-23 | Discharge: 2021-05-23 | Disposition: A | Discharge status: Home / Self Care | Payer: BC Managed Care – PPO | Source: Ambulatory Visit | Attending: Surgery | Admitting: Surgery

## 2021-05-23 ENCOUNTER — Encounter (HOSPITAL_COMMUNITY): Payer: Self-pay

## 2021-05-23 ENCOUNTER — Other Ambulatory Visit: Payer: Self-pay

## 2021-05-23 VITALS — BP 130/90 | HR 70 | Temp 97.7°F | Resp 18 | Ht 64.0 in | Wt 319.0 lb

## 2021-05-23 DIAGNOSIS — R7303 Prediabetes: Secondary | ICD-10-CM | POA: Insufficient documentation

## 2021-05-23 DIAGNOSIS — Z01812 Encounter for preprocedural laboratory examination: Secondary | ICD-10-CM | POA: Diagnosis not present

## 2021-05-23 HISTORY — DX: Prediabetes: R73.03

## 2021-05-23 LAB — COMPREHENSIVE METABOLIC PANEL
ALT: 25 U/L (ref 0–44)
AST: 28 U/L (ref 15–41)
Albumin: 4.2 g/dL (ref 3.5–5.0)
Alkaline Phosphatase: 61 U/L (ref 38–126)
Anion gap: 9 (ref 5–15)
BUN: 21 mg/dL — ABNORMAL HIGH (ref 6–20)
CO2: 25 mmol/L (ref 22–32)
Calcium: 9 mg/dL (ref 8.9–10.3)
Chloride: 101 mmol/L (ref 98–111)
Creatinine, Ser: 0.88 mg/dL (ref 0.44–1.00)
GFR, Estimated: >60 mL/min (ref >60)
Glucose, Bld: 110 mg/dL — ABNORMAL HIGH (ref 70–99)
Potassium: 4.6 mmol/L (ref 3.5–5.1)
Sodium: 135 mmol/L (ref 135–145)
Total Bilirubin: 0.6 mg/dL (ref 0.3–1.2)
Total Protein: 8 g/dL (ref 6.5–8.1)

## 2021-05-23 LAB — CBC WITH DIFFERENTIAL/PLATELET
Abs Immature Granulocytes: 0.04 10*3/uL (ref 0.00–0.07)
Basophils Absolute: 0.1 10*3/uL (ref 0.0–0.1)
Basophils Relative: 1 %
Eosinophils Absolute: 0.1 10*3/uL (ref 0.0–0.5)
Eosinophils Relative: 1 %
HCT: 43.2 % (ref 36.0–46.0)
Hemoglobin: 14.2 g/dL (ref 12.0–15.0)
Immature Granulocytes: 1 %
Lymphocytes Relative: 36 %
Lymphs Abs: 3 10*3/uL (ref 0.7–4.0)
MCH: 30.2 pg (ref 26.0–34.0)
MCHC: 32.9 g/dL (ref 30.0–36.0)
MCV: 91.9 fL (ref 80.0–100.0)
Monocytes Absolute: 0.6 10*3/uL (ref 0.1–1.0)
Monocytes Relative: 8 %
Neutro Abs: 4.5 10*3/uL (ref 1.7–7.7)
Neutrophils Relative %: 53 %
Platelets: 247 10*3/uL (ref 150–400)
RBC: 4.7 MIL/uL (ref 3.87–5.11)
RDW: 14 % (ref 11.5–15.5)
WBC: 8.3 10*3/uL (ref 4.0–10.5)
nRBC: 0 % (ref 0.0–0.2)

## 2021-05-23 LAB — TYPE AND SCREEN
ABO/RH(D): A NEG
Antibody Screen: NEGATIVE

## 2021-05-23 LAB — GLUCOSE, CAPILLARY: Glucose-Capillary: 117 mg/dL — ABNORMAL HIGH (ref 70–99)

## 2021-05-23 NOTE — Progress Notes (Signed)
For Short Stay: ?[place] appointment date: N/A ?Date of COVID positive in last 90 days: N/A ?COVID Vaccine: NO ?Bowel Prep reminder: Reviewed ? ? ?For Anesthesia: ?PCP - Rosalee Kaufman: PAC ?Cardiologist -  ? ?Chest x-ray -  ?EKG -  ?Stress Test -  ?ECHO - 01/27/20 ?Cardiac Cath -  ?Pacemaker/ICD device last checked: ?Pacemaker orders received: ?Device Rep notified: ? ?Spinal Cord Stimulator: ? ?Sleep Study -  ?CPAP -  ? ?Fasting Blood Sugar -  ?Checks Blood Sugar _____ times a day ?Date and result of last Hgb A1c- ? ?Blood Thinner Instructions: ?Aspirin Instructions: ?Last Dose: ? ?Activity level: Can go up a flight of stairs and activities of daily living without stopping and without chest pain and/or shortness of breath ?  Able to exercise without chest pain and/or shortness of breath ?  Unable to go up a flight of stairs without chest pain and/or shortness of breath ?   ? ?Anesthesia review:  ? ?Patient denies shortness of breath, fever, cough and chest pain at PAT appointment ? ? ?Patient verbalized understanding of instructions that were given to them at the PAT appointment. Patient was also instructed that they will need to review over the PAT instructions again at home before surgery.  ?

## 2021-05-24 LAB — HEMOGLOBIN A1C
Hgb A1c MFr Bld: 5.9 % — ABNORMAL HIGH (ref 4.8–5.6)
Mean Plasma Glucose: 123 mg/dL

## 2021-05-24 NOTE — Progress Notes (Signed)
Hgba1c-5.9 on 05/23/21.  ?

## 2021-06-04 ENCOUNTER — Inpatient Hospital Stay (HOSPITAL_COMMUNITY): Payer: BC Managed Care – PPO | Admitting: Anesthesiology

## 2021-06-04 ENCOUNTER — Encounter (HOSPITAL_COMMUNITY): Payer: Self-pay | Admitting: Surgery

## 2021-06-04 ENCOUNTER — Other Ambulatory Visit: Payer: Self-pay

## 2021-06-04 ENCOUNTER — Inpatient Hospital Stay (HOSPITAL_COMMUNITY)
Admission: RE | Admit: 2021-06-04 | Discharge: 2021-06-06 | DRG: 620 | Disposition: A | Discharge status: Home / Self Care | Payer: BC Managed Care – PPO | Source: Ambulatory Visit | Attending: Surgery | Admitting: Surgery

## 2021-06-04 ENCOUNTER — Encounter (HOSPITAL_COMMUNITY)
Admission: RE | Disposition: A | Discharge status: Home / Self Care | Payer: Self-pay | Source: Ambulatory Visit | Attending: Surgery

## 2021-06-04 DIAGNOSIS — E785 Hyperlipidemia, unspecified: Secondary | ICD-10-CM | POA: Diagnosis present

## 2021-06-04 DIAGNOSIS — E039 Hypothyroidism, unspecified: Secondary | ICD-10-CM | POA: Diagnosis present

## 2021-06-04 DIAGNOSIS — Z9049 Acquired absence of other specified parts of digestive tract: Secondary | ICD-10-CM

## 2021-06-04 DIAGNOSIS — D62 Acute posthemorrhagic anemia: Secondary | ICD-10-CM | POA: Diagnosis not present

## 2021-06-04 DIAGNOSIS — Z8349 Family history of other endocrine, nutritional and metabolic diseases: Secondary | ICD-10-CM | POA: Diagnosis not present

## 2021-06-04 DIAGNOSIS — K219 Gastro-esophageal reflux disease without esophagitis: Secondary | ICD-10-CM | POA: Diagnosis present

## 2021-06-04 DIAGNOSIS — F32A Depression, unspecified: Secondary | ICD-10-CM | POA: Diagnosis present

## 2021-06-04 DIAGNOSIS — I973 Postprocedural hypertension: Secondary | ICD-10-CM | POA: Diagnosis not present

## 2021-06-04 DIAGNOSIS — E282 Polycystic ovarian syndrome: Secondary | ICD-10-CM | POA: Diagnosis present

## 2021-06-04 DIAGNOSIS — Z888 Allergy status to other drugs, medicaments and biological substances status: Secondary | ICD-10-CM

## 2021-06-04 DIAGNOSIS — Z7984 Long term (current) use of oral hypoglycemic drugs: Secondary | ICD-10-CM

## 2021-06-04 DIAGNOSIS — M1990 Unspecified osteoarthritis, unspecified site: Secondary | ICD-10-CM | POA: Diagnosis present

## 2021-06-04 DIAGNOSIS — N179 Acute kidney failure, unspecified: Secondary | ICD-10-CM | POA: Diagnosis not present

## 2021-06-04 DIAGNOSIS — Z833 Family history of diabetes mellitus: Secondary | ICD-10-CM

## 2021-06-04 DIAGNOSIS — M549 Dorsalgia, unspecified: Secondary | ICD-10-CM | POA: Diagnosis present

## 2021-06-04 DIAGNOSIS — Z8249 Family history of ischemic heart disease and other diseases of the circulatory system: Secondary | ICD-10-CM | POA: Diagnosis not present

## 2021-06-04 DIAGNOSIS — Z79890 Hormone replacement therapy (postmenopausal): Secondary | ICD-10-CM

## 2021-06-04 DIAGNOSIS — E7800 Pure hypercholesterolemia, unspecified: Secondary | ICD-10-CM | POA: Diagnosis present

## 2021-06-04 DIAGNOSIS — Z881 Allergy status to other antibiotic agents status: Secondary | ICD-10-CM

## 2021-06-04 DIAGNOSIS — M5410 Radiculopathy, site unspecified: Secondary | ICD-10-CM | POA: Diagnosis present

## 2021-06-04 DIAGNOSIS — Z79899 Other long term (current) drug therapy: Secondary | ICD-10-CM

## 2021-06-04 DIAGNOSIS — Z6841 Body Mass Index (BMI) 40.0 and over, adult: Secondary | ICD-10-CM

## 2021-06-04 DIAGNOSIS — K760 Fatty (change of) liver, not elsewhere classified: Secondary | ICD-10-CM | POA: Diagnosis present

## 2021-06-04 DIAGNOSIS — E875 Hyperkalemia: Secondary | ICD-10-CM | POA: Diagnosis not present

## 2021-06-04 HISTORY — PX: UPPER GI ENDOSCOPY: SHX6162

## 2021-06-04 HISTORY — PX: LAPAROSCOPIC GASTRIC SLEEVE RESECTION: SHX5895

## 2021-06-04 LAB — PREGNANCY, URINE: Preg Test, Ur: NEGATIVE

## 2021-06-04 LAB — ABO/RH: ABO/RH(D): A NEG

## 2021-06-04 LAB — GLUCOSE, CAPILLARY: Glucose-Capillary: 94 mg/dL (ref 70–99)

## 2021-06-04 SURGERY — GASTRECTOMY, SLEEVE, LAPAROSCOPIC
Anesthesia: General

## 2021-06-04 MED ORDER — SCOPOLAMINE 1 MG/3DAYS TD PT72
1.0000 | MEDICATED_PATCH | TRANSDERMAL | Status: DC
  Administered 2021-06-04: 1.5 mg via TRANSDERMAL
  Filled 2021-06-04: qty 1

## 2021-06-04 MED ORDER — ONDANSETRON HCL 4 MG/2ML IJ SOLN
4.0000 mg | INTRAMUSCULAR | Status: DC | PRN
  Administered 2021-06-06: 4 mg via INTRAVENOUS
  Filled 2021-06-04: qty 2

## 2021-06-04 MED ORDER — BUPIVACAINE LIPOSOME 1.3 % IJ SUSP: 20.0000 mL | Freq: Once | INTRAMUSCULAR | Status: DC

## 2021-06-04 MED ORDER — 0.9 % SODIUM CHLORIDE (POUR BTL) OPTIME
TOPICAL | Status: DC | PRN
  Administered 2021-06-04: 1000 mL

## 2021-06-04 MED ORDER — MEPERIDINE HCL 50 MG/ML IJ SOLN: 6.2500 mg | INTRAMUSCULAR | Status: DC | PRN

## 2021-06-04 MED ORDER — CHLORHEXIDINE GLUCONATE 0.12 % MT SOLN
15.0000 mL | Freq: Once | OROMUCOSAL | Status: AC
  Administered 2021-06-04: 15 mL via OROMUCOSAL

## 2021-06-04 MED ORDER — LABETALOL HCL 5 MG/ML IV SOLN
5.0000 mg | INTRAVENOUS | Status: DC | PRN
  Administered 2021-06-04: 5 mg via INTRAVENOUS

## 2021-06-04 MED ORDER — RIMEGEPANT SULFATE 75 MG PO TBDP: 75.0000 mg | ORAL_TABLET | Freq: Every day | ORAL | Status: DC | PRN

## 2021-06-04 MED ORDER — ONDANSETRON HCL 4 MG/2ML IJ SOLN
INTRAMUSCULAR | Status: DC | PRN
  Administered 2021-06-04: 4 mg via INTRAVENOUS

## 2021-06-04 MED ORDER — PROPOFOL 10 MG/ML IV BOLUS
INTRAVENOUS | Status: AC
  Filled 2021-06-04: qty 20

## 2021-06-04 MED ORDER — KETAMINE HCL 10 MG/ML IJ SOLN
INTRAMUSCULAR | Status: DC | PRN
  Administered 2021-06-04: 15 mg via INTRAVENOUS
  Administered 2021-06-04: 10 mg via INTRAVENOUS

## 2021-06-04 MED ORDER — LIDOCAINE 2% (20 MG/ML) 5 ML SYRINGE
INTRAMUSCULAR | Status: DC | PRN
  Administered 2021-06-04: 80 mg via INTRAVENOUS

## 2021-06-04 MED ORDER — OXYCODONE HCL 5 MG/5ML PO SOLN
5.0000 mg | Freq: Four times a day (QID) | ORAL | Status: DC | PRN
  Administered 2021-06-04 – 2021-06-05 (×2): 5 mg via ORAL
  Filled 2021-06-04 (×2): qty 5

## 2021-06-04 MED ORDER — SODIUM CHLORIDE 0.9 % IV SOLN: INTRAVENOUS | Status: DC

## 2021-06-04 MED ORDER — ONDANSETRON HCL 4 MG/2ML IJ SOLN: 4.0000 mg | Freq: Once | INTRAMUSCULAR | Status: DC | PRN

## 2021-06-04 MED ORDER — MIDAZOLAM HCL 2 MG/2ML IJ SOLN
INTRAMUSCULAR | Status: DC | PRN
  Administered 2021-06-04: 2 mg via INTRAVENOUS

## 2021-06-04 MED ORDER — HYDROMORPHONE HCL 1 MG/ML IJ SOLN
0.5000 mg | INTRAMUSCULAR | Status: DC | PRN
  Administered 2021-06-04: 0.5 mg via INTRAVENOUS
  Filled 2021-06-04 (×2): qty 0.5

## 2021-06-04 MED ORDER — OXYCODONE HCL 5 MG PO TABS: 5.0000 mg | ORAL_TABLET | Freq: Once | ORAL | Status: DC | PRN

## 2021-06-04 MED ORDER — HYDRALAZINE HCL 20 MG/ML IJ SOLN: 10.0000 mg | INTRAMUSCULAR | Status: DC | PRN

## 2021-06-04 MED ORDER — BUPIVACAINE LIPOSOME 1.3 % IJ SUSP
INTRAMUSCULAR | Status: DC | PRN
  Administered 2021-06-04: 20 mL

## 2021-06-04 MED ORDER — BUPIVACAINE LIPOSOME 1.3 % IJ SUSP
INTRAMUSCULAR | Status: AC
  Filled 2021-06-04: qty 20

## 2021-06-04 MED ORDER — ENOXAPARIN SODIUM 30 MG/0.3ML IJ SOSY
30.0000 mg | PREFILLED_SYRINGE | Freq: Two times a day (BID) | INTRAMUSCULAR | Status: DC
  Administered 2021-06-05: 30 mg via SUBCUTANEOUS
  Filled 2021-06-04: qty 0.3

## 2021-06-04 MED ORDER — PROPOFOL 10 MG/ML IV BOLUS
INTRAVENOUS | Status: DC | PRN
  Administered 2021-06-04: 200 mg via INTRAVENOUS
  Administered 2021-06-04: 100 mg via INTRAVENOUS

## 2021-06-04 MED ORDER — KETOROLAC TROMETHAMINE 15 MG/ML IJ SOLN: 15.0000 mg | Freq: Three times a day (TID) | INTRAMUSCULAR | Status: DC | PRN

## 2021-06-04 MED ORDER — ACETAMINOPHEN 325 MG PO TABS: 325.0000 mg | ORAL_TABLET | ORAL | Status: DC | PRN

## 2021-06-04 MED ORDER — GABAPENTIN 300 MG PO CAPS
300.0000 mg | ORAL_CAPSULE | ORAL | Status: AC
  Administered 2021-06-04: 300 mg via ORAL
  Filled 2021-06-04: qty 1

## 2021-06-04 MED ORDER — LEVOTHYROXINE SODIUM 100 MCG PO TABS
300.0000 ug | ORAL_TABLET | Freq: Every day | ORAL | Status: DC
  Administered 2021-06-05 – 2021-06-06 (×2): 300 ug via ORAL
  Filled 2021-06-04 (×2): qty 3

## 2021-06-04 MED ORDER — PANTOPRAZOLE SODIUM 40 MG IV SOLR
40.0000 mg | Freq: Every day | INTRAVENOUS | Status: DC
  Administered 2021-06-04 – 2021-06-05 (×2): 40 mg via INTRAVENOUS
  Filled 2021-06-04 (×2): qty 10

## 2021-06-04 MED ORDER — LACTATED RINGERS IV SOLN: INTRAVENOUS | Status: DC

## 2021-06-04 MED ORDER — ACETAMINOPHEN 160 MG/5ML PO SOLN: 1000.0000 mg | Freq: Three times a day (TID) | ORAL | Status: DC

## 2021-06-04 MED ORDER — APREPITANT 40 MG PO CAPS
40.0000 mg | ORAL_CAPSULE | ORAL | Status: AC
  Administered 2021-06-04: 40 mg via ORAL
  Filled 2021-06-04: qty 1

## 2021-06-04 MED ORDER — ACETAMINOPHEN 500 MG PO TABS
1000.0000 mg | ORAL_TABLET | ORAL | Status: AC
Start: 2021-06-04 — End: 2021-06-04
  Administered 2021-06-04: 1000 mg via ORAL
  Filled 2021-06-04: qty 2

## 2021-06-04 MED ORDER — METOCLOPRAMIDE HCL 5 MG/ML IJ SOLN
10.0000 mg | Freq: Four times a day (QID) | INTRAMUSCULAR | Status: DC
  Administered 2021-06-04 – 2021-06-05 (×5): 10 mg via INTRAVENOUS
  Filled 2021-06-04 (×6): qty 2

## 2021-06-04 MED ORDER — METOPROLOL TARTRATE 5 MG/5ML IV SOLN: 5.0000 mg | Freq: Four times a day (QID) | INTRAVENOUS | Status: DC | PRN

## 2021-06-04 MED ORDER — DOCUSATE SODIUM 100 MG PO CAPS
100.0000 mg | ORAL_CAPSULE | Freq: Two times a day (BID) | ORAL | Status: DC
  Administered 2021-06-04 – 2021-06-06 (×4): 100 mg via ORAL
  Filled 2021-06-04 (×4): qty 1

## 2021-06-04 MED ORDER — SIMETHICONE 80 MG PO CHEW: 80.0000 mg | CHEWABLE_TABLET | Freq: Four times a day (QID) | ORAL | Status: DC | PRN

## 2021-06-04 MED ORDER — FENTANYL CITRATE (PF) 100 MCG/2ML IJ SOLN
INTRAMUSCULAR | Status: AC
  Filled 2021-06-04: qty 2

## 2021-06-04 MED ORDER — CHLORHEXIDINE GLUCONATE 4 % EX LIQD: 60.0000 mL | Freq: Once | CUTANEOUS | Status: DC

## 2021-06-04 MED ORDER — ROCURONIUM BROMIDE 10 MG/ML (PF) SYRINGE
PREFILLED_SYRINGE | INTRAVENOUS | Status: DC | PRN
  Administered 2021-06-04: 100 mg via INTRAVENOUS

## 2021-06-04 MED ORDER — SODIUM CHLORIDE 0.9 % IV SOLN
2.0000 g | INTRAVENOUS | Status: AC
  Administered 2021-06-04: 2 g via INTRAVENOUS
  Filled 2021-06-04: qty 2

## 2021-06-04 MED ORDER — LIDOCAINE-EPINEPHRINE 0.5 %-1:200000 IJ SOLN
30.0000 mL | Freq: Once | INTRAMUSCULAR | Status: AC
  Administered 2021-06-04: 30 mL
  Filled 2021-06-04: qty 30

## 2021-06-04 MED ORDER — ENSURE MAX PROTEIN PO LIQD
2.0000 [oz_av] | ORAL | Status: DC
  Administered 2021-06-05 – 2021-06-06 (×9): 2 [oz_av] via ORAL

## 2021-06-04 MED ORDER — TRAMADOL HCL 50 MG PO TABS
50.0000 mg | ORAL_TABLET | Freq: Four times a day (QID) | ORAL | Status: DC | PRN
  Administered 2021-06-05 (×2): 50 mg via ORAL
  Filled 2021-06-04 (×2): qty 1

## 2021-06-04 MED ORDER — ACETAMINOPHEN 160 MG/5ML PO SOLN: 325.0000 mg | ORAL | Status: DC | PRN

## 2021-06-04 MED ORDER — MIDAZOLAM HCL 2 MG/2ML IJ SOLN
INTRAMUSCULAR | Status: AC
  Filled 2021-06-04: qty 2

## 2021-06-04 MED ORDER — LABETALOL HCL 5 MG/ML IV SOLN
INTRAVENOUS | Status: AC
  Filled 2021-06-04: qty 4

## 2021-06-04 MED ORDER — ONDANSETRON HCL 4 MG/2ML IJ SOLN
INTRAMUSCULAR | Status: AC
  Filled 2021-06-04: qty 2

## 2021-06-04 MED ORDER — BUPIVACAINE-EPINEPHRINE (PF) 0.25% -1:200000 IJ SOLN
INTRAMUSCULAR | Status: AC
  Filled 2021-06-04: qty 30

## 2021-06-04 MED ORDER — KETAMINE HCL 50 MG/5ML IJ SOSY
PREFILLED_SYRINGE | INTRAMUSCULAR | Status: AC
  Filled 2021-06-04: qty 5

## 2021-06-04 MED ORDER — ENOXAPARIN SODIUM 30 MG/0.3ML IJ SOSY: 30.0000 mg | PREFILLED_SYRINGE | Freq: Two times a day (BID) | INTRAMUSCULAR | Status: DC

## 2021-06-04 MED ORDER — SUGAMMADEX SODIUM 500 MG/5ML IV SOLN
INTRAVENOUS | Status: AC
  Filled 2021-06-04: qty 5

## 2021-06-04 MED ORDER — ACETAMINOPHEN 500 MG PO TABS
1000.0000 mg | ORAL_TABLET | Freq: Three times a day (TID) | ORAL | Status: DC
  Administered 2021-06-04 – 2021-06-06 (×6): 1000 mg via ORAL
  Filled 2021-06-04 (×6): qty 2

## 2021-06-04 MED ORDER — METHOCARBAMOL 1000 MG/10ML IJ SOLN
500.0000 mg | Freq: Four times a day (QID) | INTRAMUSCULAR | Status: DC | PRN
Start: 2021-06-04 — End: 2021-06-06
  Administered 2021-06-05: 500 mg via INTRAVENOUS
  Filled 2021-06-04: qty 5
  Filled 2021-06-04: qty 500

## 2021-06-04 MED ORDER — FENTANYL CITRATE (PF) 100 MCG/2ML IJ SOLN
INTRAMUSCULAR | Status: DC | PRN
  Administered 2021-06-04: 100 ug via INTRAVENOUS
  Administered 2021-06-04 (×2): 25 ug via INTRAVENOUS

## 2021-06-04 MED ORDER — LACTATED RINGERS IR SOLN
Status: DC | PRN
Start: 2021-06-04 — End: 2021-06-04
  Administered 2021-06-04: 1

## 2021-06-04 MED ORDER — DEXAMETHASONE SODIUM PHOSPHATE 10 MG/ML IJ SOLN
INTRAMUSCULAR | Status: AC
  Filled 2021-06-04: qty 1

## 2021-06-04 MED ORDER — GABAPENTIN 100 MG PO CAPS
200.0000 mg | ORAL_CAPSULE | Freq: Two times a day (BID) | ORAL | Status: DC
  Administered 2021-06-04 – 2021-06-06 (×4): 200 mg via ORAL
  Filled 2021-06-04 (×4): qty 2

## 2021-06-04 MED ORDER — ESCITALOPRAM OXALATE 20 MG PO TABS
10.0000 mg | ORAL_TABLET | Freq: Every day | ORAL | Status: DC
Start: 2021-06-04 — End: 2021-06-06
  Administered 2021-06-04 – 2021-06-05 (×2): 10 mg via ORAL
  Filled 2021-06-04 (×3): qty 1

## 2021-06-04 MED ORDER — SUGAMMADEX SODIUM 500 MG/5ML IV SOLN
INTRAVENOUS | Status: DC | PRN
  Administered 2021-06-04: 300 mg via INTRAVENOUS

## 2021-06-04 MED ORDER — BUPIVACAINE-EPINEPHRINE 0.25% -1:200000 IJ SOLN
INTRAMUSCULAR | Status: DC | PRN
  Administered 2021-06-04: 30 mL

## 2021-06-04 MED ORDER — DEXAMETHASONE SODIUM PHOSPHATE 10 MG/ML IJ SOLN
INTRAMUSCULAR | Status: DC | PRN
  Administered 2021-06-04: 4 mg via INTRAVENOUS

## 2021-06-04 MED ORDER — OXYCODONE HCL 5 MG/5ML PO SOLN: 5.0000 mg | Freq: Once | ORAL | Status: DC | PRN

## 2021-06-04 MED ORDER — FENTANYL CITRATE PF 50 MCG/ML IJ SOSY: 25.0000 ug | PREFILLED_SYRINGE | INTRAMUSCULAR | Status: DC | PRN

## 2021-06-04 MED ORDER — ORAL CARE MOUTH RINSE: 15.0000 mL | Freq: Once | OROMUCOSAL | Status: AC

## 2021-06-04 MED ORDER — HEPARIN SODIUM (PORCINE) 5000 UNIT/ML IJ SOLN
5000.0000 [IU] | INTRAMUSCULAR | Status: AC
  Administered 2021-06-04: 5000 [IU] via SUBCUTANEOUS
  Filled 2021-06-04: qty 1

## 2021-06-04 SURGICAL SUPPLY — 72 items
APPLIER CLIP ROT 10 11.4 M/L (STAPLE)
APPLIER CLIP ROT 13.4 12 LRG (CLIP)
BAG COUNTER SPONGE SURGICOUNT (BAG) IMPLANT
BAG LAPAROSCOPIC 12 15 PORT 16 (BASKET) IMPLANT
BAG RETRIEVAL 12/15 (BASKET) ×2
BENZOIN TINCTURE PRP APPL 2/3 (GAUZE/BANDAGES/DRESSINGS) ×2 IMPLANT
BLADE SURG SZ11 CARB STEEL (BLADE) ×2 IMPLANT
BNDG ADH 1X3 SHEER STRL LF (GAUZE/BANDAGES/DRESSINGS) ×12 IMPLANT
CABLE HIGH FREQUENCY MONO STRZ (ELECTRODE) IMPLANT
CHLORAPREP W/TINT 26 (MISCELLANEOUS) ×4 IMPLANT
CLIP APPLIE ROT 10 11.4 M/L (STAPLE) IMPLANT
CLIP APPLIE ROT 13.4 12 LRG (CLIP) IMPLANT
COVER SURGICAL LIGHT HANDLE (MISCELLANEOUS) ×2 IMPLANT
DEVICE SUT QUICK LOAD TK 5 (STAPLE) IMPLANT
DEVICE SUT TI-KNOT TK 5X26 (MISCELLANEOUS) IMPLANT
DRAPE UTILITY XL STRL (DRAPES) ×4 IMPLANT
ELECT REM PT RETURN 15FT ADLT (MISCELLANEOUS) ×2 IMPLANT
GAUZE SPONGE 4X4 12PLY STRL (GAUZE/BANDAGES/DRESSINGS) IMPLANT
GLOVE SURG ENC MOIS LTX SZ6 (GLOVE) ×2 IMPLANT
GLOVE SURG MICRO LTX SZ6 (GLOVE) ×2 IMPLANT
GLOVE SURG UNDER LTX SZ6.5 (GLOVE) ×2 IMPLANT
GOWN STRL REUS W/ TWL LRG LVL3 (GOWN DISPOSABLE) ×1 IMPLANT
GOWN STRL REUS W/ TWL XL LVL3 (GOWN DISPOSABLE) IMPLANT
GOWN STRL REUS W/TWL LRG LVL3 (GOWN DISPOSABLE) ×1
GOWN STRL REUS W/TWL XL LVL3 (GOWN DISPOSABLE)
GRASPER SUT TROCAR 14GX15 (MISCELLANEOUS) ×2 IMPLANT
IRRIG SUCT STRYKERFLOW 2 WTIP (MISCELLANEOUS) ×2
IRRIGATION SUCT STRKRFLW 2 WTP (MISCELLANEOUS) ×1 IMPLANT
KIT BASIN OR (CUSTOM PROCEDURE TRAY) ×2 IMPLANT
KIT TURNOVER KIT A (KITS) IMPLANT
MARKER SKIN DUAL TIP RULER LAB (MISCELLANEOUS) ×2 IMPLANT
MAT PREVALON FULL STRYKER (MISCELLANEOUS) ×2 IMPLANT
NDL SPNL 22GX3.5 QUINCKE BK (NEEDLE) ×1 IMPLANT
NEEDLE SPNL 22GX3.5 QUINCKE BK (NEEDLE) ×2 IMPLANT
PACK UNIVERSAL I (CUSTOM PROCEDURE TRAY) ×2 IMPLANT
RELOAD ENDO STITCH (ENDOMECHANICALS) IMPLANT
RELOAD STAPLE 60 3.6 BLU REG (STAPLE) ×1 IMPLANT
RELOAD STAPLE 60 3.8 GOLD REG (STAPLE) ×1 IMPLANT
RELOAD STAPLE 60 4.1 GRN THCK (STAPLE) IMPLANT
RELOAD STAPLER BLUE 60MM (STAPLE) ×5 IMPLANT
RELOAD STAPLER GOLD 60MM (STAPLE) ×1 IMPLANT
RELOAD STAPLER GREEN 60MM (STAPLE) ×1 IMPLANT
RELOAD SUT TRIPLE-STITCH 2-0 (ENDOMECHANICALS) IMPLANT
SCISSORS LAP 5X45 EPIX DISP (ENDOMECHANICALS) ×2 IMPLANT
SET TUBE SMOKE EVAC HIGH FLOW (TUBING) ×2 IMPLANT
SHEARS HARMONIC ACE PLUS 45CM (MISCELLANEOUS) ×2 IMPLANT
SLEEVE ADV FIXATION 5X100MM (TROCAR) ×4 IMPLANT
SLEEVE GASTRECTOMY 40FR VISIGI (MISCELLANEOUS) ×2 IMPLANT
SOL ANTI FOG 6CC (MISCELLANEOUS) ×1 IMPLANT
SOLUTION ANTI FOG 6CC (MISCELLANEOUS) ×1
SPIKE FLUID TRANSFER (MISCELLANEOUS) ×2 IMPLANT
SPONGE T-LAP 18X18 ~~LOC~~+RFID (SPONGE) ×2 IMPLANT
STAPLE LINE REINFORCEMENT LAP (STAPLE) ×6 IMPLANT
STAPLER ECHELON LONG 60 440 (INSTRUMENTS) ×2 IMPLANT
STAPLER RELOAD BLUE 60MM (STAPLE) ×10
STAPLER RELOAD GOLD 60MM (STAPLE) ×2
STAPLER RELOAD GREEN 60MM (STAPLE) ×2
STRIP CLOSURE SKIN 1/2X4 (GAUZE/BANDAGES/DRESSINGS) ×2 IMPLANT
SUT MNCRL AB 4-0 PS2 18 (SUTURE) ×2 IMPLANT
SUT SURGIDAC NAB ES-9 0 48 120 (SUTURE) IMPLANT
SUT VICRYL 0 TIES 12 18 (SUTURE) ×2 IMPLANT
SYR 10ML ECCENTRIC (SYRINGE) ×2 IMPLANT
SYR 20ML LL LF (SYRINGE) ×2 IMPLANT
SYR 50ML LL SCALE MARK (SYRINGE) ×2 IMPLANT
TAPE STRIPS DRAPE STRL (GAUZE/BANDAGES/DRESSINGS) ×1 IMPLANT
TOWEL OR 17X26 10 PK STRL BLUE (TOWEL DISPOSABLE) ×2 IMPLANT
TOWEL OR NON WOVEN STRL DISP B (DISPOSABLE) ×2 IMPLANT
TROCAR ADV FIXATION 5X100MM (TROCAR) ×2 IMPLANT
TROCAR BLADELESS 15MM (ENDOMECHANICALS) ×2 IMPLANT
TROCAR BLADELESS OPT 5 100 (ENDOMECHANICALS) ×2 IMPLANT
TUBING CONNECTING 10 (TUBING) ×2 IMPLANT
TUBING ENDO SMARTCAP (MISCELLANEOUS) ×2 IMPLANT

## 2021-06-04 NOTE — Op Note (Signed)
Operative Note ? ?AVIONNA BOWER  ?[id number]  ?[id number]  ?06/04/2021 ? ? ?Surgeon: Romana Juniper MD ?  ?Assistant: Louanna Raw MD ?  ?Procedure performed: laparoscopic sleeve gastrectomy, upper endoscopy ?  ?Preop diagnosis: Morbid obesity Body mass index is 52.87 kg/m?Marland Kitchen ?Post-op diagnosis/intraop findings: same ?  ?Specimens: fundus ?Retained items: none  ?EBL: minimal  ?Complications: none ?  ?Description of procedure: After obtaining informed consent and administration of chemical DVT prophylaxis in holding, the patient was taken to the operating room and placed supine on the operating room table where general endotracheal anesthesia was initiated, preoperative antibiotics were administered, SCDs applied, and a formal timeout was performed. The abdomen was prepped and draped in usual sterile fashion. Peritoneal access was gained using a Visiport technique in the left upper quadrant and insufflation to 15 mmHg ensued without issue. Gross inspection revealed no evidence of injury. Under direct visualization three more 5 mm trochars were placed in the right and left hemiabdomen and the 16m trocar in the right paramedian upper abdomen. Bilateral laparoscopic assisted TAPS blocks were performed with Exparel diluted with 0.25 percent Marcaine with epinephrine. The patient was placed in steep Trendelenburg and the liver retractor was introduced through an incision in the upper midline and secured to the post externally to maintain the left lobe retracted anteriorly.  ?There is no hiatal hernia. Using the Harmonic scalpel, the greater curvature of the stomach was dissected away from the greater omentum and short gastric vessels were divided. This began 6 cm from the pylorus, and dissection proceeded until the left crus was clearly exposed. There were some filmy adhesions of the posterior stomach to the pancreas which were divided with the Harmonic. Esophageal fat pad was mobilized off the anterior stomach  slightly. The [id number] was then introduced and directed down towards the pylorus. This was placed to suction against the lesser curve. Serial fires of the linear cutting stapler with staple line reinforcements were then employed to create our sleeve. The first fire used a green load and ensured adequate room at the angularis incisura. One gold load and then several blue loads were then employed to create a narrow tubular stomach up to the angle of His. The excised stomach was then removed through our 15 mm trocar site within an Endo Catch bag.  ?The visigi was taken off of suction and a few puffs of air were introduced, inflating the sleeve. No bubbles were observed in the irrigation fluid around the stomach and the shape was noted to be evenly tubular without any narrowing at the angularis. The visigi was then removed. Upper endoscopy was performed by the assistant surgeon and the sleeve was noted to be airtight, the staple line was hemostatic. Please see his separate note. The endoscope was removed. The 15 mm trocar site fascia in the right upper abdomen was closed with a 0 Vicryl using the laparoscopic suture passer under direct visualization. The liver retractor was removed under direct visualization. The abdomen was then desufflated and all remaining trochars removed. The skin incisions were closed with subcuticular Monocryl; benzoin, Steri-Strips and Band-Aids were applied The patient was then awakened, extubated and taken to PACU in stable condition.   ?  ?All counts were correct at the completion of the case.  ?  ? ?

## 2021-06-04 NOTE — Op Note (Signed)
? ?  Patient: Amber Moss (1975-11-07, [id number]) ? ?Date of Surgery: 06/04/2021  ? ?Preoperative Diagnosis: morbid obesity  ? ?Postoperative Diagnosis: morbid obesity  ? ?Surgical Procedure: Upper Endoscopy  ? ?Surgeon: Louanna Raw, MD ? ?Anesthesiologist: Josephine Igo, MD; Janeece Riggers, MD ?CRNA: Niel Hummer, CRNA  ? ?Anesthesia: General  ? ?Fluids:  ?Total I/O ?In: 100 [IV Piggyback:100] ?Out: -  ? ?Complications: None ? ?Drains:  None ? ?Specimen: None ? ? ?Indications for Procedure: [name] is a 46 y.o. female undergoing laparoscopic sleeve gastrectomy and an EGD was requested to evaluate foregut anatomy intraoperatively. ? ?Description of Procedure: During the procedure, I scrubbed out and obtained the Olympus endoscope. I gently placed endoscope in the patient's oropharynx and gently glided it down the esophagus without any difficulty under direct visualization.  The scope was advanced as far as the pylorus and then slowly withdrawn to inspect the foregut anatomy.  Dr. Kae Heller had placed saline in the upper abdomen and all staple lines were submerged to ensure no air leak. There was no evidence of bubbles. There was no evidence of intraluminal bleeding and the mucosa appeared healthy.  The lumen was widely patent without evidence of stricture.  The intraluminal insufflation was decompressed. The scope was withdrawn. The patient tolerated this portion of the procedure well. Please see Dr Ron Parker operative note for details regarding the remainder of the procedure.  ? ? ?Louanna Raw, MD ?General, Bariatric, & Minimally Invasive Surgery ?[facility] [place] ? ?

## 2021-06-04 NOTE — Progress Notes (Signed)
PHARMACY CONSULT FOR:  Risk Assessment for Post-Discharge VTE Following Bariatric Surgery ? ?Post-Discharge VTE Risk Assessment: ?This patient's probability of 30-day post-discharge VTE is increased due to the factors marked: ?x Sleeve gastrectomy  ?x Liver disorder (transplant, cirrhosis, or nonalcoholic steatohepatitis)  ? Hx of VTE  ? Hemorrhage requiring transfusion  ? GI perforation, leak, or obstruction  ? ====================================================  ?  Female  ?  Age >/=60 years  ?x  BMI >/=50 kg/m2  ?  CHF  ?  Dyspnea at Rest  ?  Paraplegia  ?x  Non-gastric-band surgery  ?  Operation Time >/=3 hr  ?  Return to OR   ?  Length of Stay >/= 3 d  ? Hypercoagulable condition  ? Significant venous stasis  ? ? ? ? ?Predicted probability of 30-day post-discharge VTE: ?- 0.27%  ?- This pt has two risk factors for portomesenteric vein thrombosis: sleeve gastrectomy procedure and hx of NASH  ? ?Other patient-specific factors to consider: ? ? ?Recommendation for Discharge: ?Enoxaparin 60 mg [place] q12h x 30 days post-discharge ( BMI > 50)  ? ? ? ? ? ?Amber Moss is a 46 y.o. female who underwent  laparoscopic sleeve gastrectomy on 06/04/2020  ?  ?Case start: 1351 ?Case end: 4742 ? ? ?Allergies  ?Allergen Reactions  ? Doxycycline Hives, Diarrhea and Nausea And Vomiting  ? Keflex [Cephalexin] Hives, Diarrhea and Nausea And Vomiting  ? Baclofen Other (See Comments)  ?  Migraine  ? Vancomycin Itching  ?  Via IV, by quick infusion only   ? ? ?Patient Measurements: ?Height: '5\' 4"'$  (162.6 cm) ?Weight: (!) 139.7 kg (308 lb) ?IBW/kg (Calculated) : 54.7 ?Body mass index is 52.87 kg/m?. ? ?No results for input(s): WBC, HGB, HCT, PLT, APTT, CREATININE, LABCREA, CREATININE, CREAT24HRUR, MG, PHOS, ALBUMIN, PROT, ALBUMIN, AST, ALT, ALKPHOS, BILITOT, BILIDIR, IBILI in the last 72 hours. ?Estimated Creatinine Clearance: 114.2 mL/min (by C-G formula based on SCr of 0.88 mg/dL). ? ? ? ?Past Medical History:  ?Diagnosis Date  ?  Arthritis   ? back  ? Depression   ? Easy bruising   ? Fatty liver   ? GERD (gastroesophageal reflux disease)   ? Hypercholesterolemia   ? diet controlled, no med  ? Hypothyroidism   ? Migraines   ? Polycystic ovarian disease   ? PONV (postoperative nausea and vomiting)   ? Pre-diabetes   ? ? ? ?Medications Prior to Admission  ?Medication Sig Dispense Refill Last Dose  ? Erenumab-aooe (AIMOVIG) 140 MG/ML SOAJ Inject 140 mg into the skin every 30 (thirty) days. 2 mL 0 05/18/2021  ? escitalopram (LEXAPRO) 10 MG tablet Take 10 mg by mouth daily.   Past Week  ? magnesium oxide (MAG-OX) 400 MG tablet Take 400 mg by mouth at bedtime.   Past Month  ? pantoprazole (PROTONIX) 40 MG tablet Take 40 mg by mouth 2 (two) times daily.   Past Month  ? promethazine (PHENERGAN) 25 MG tablet Take 25 mg by mouth every 8 (eight) hours as needed for nausea or vomiting.   Past Month  ? Propylene Glycol (SYSTANE BALANCE) 0.6 % SOLN Place 1 drop into both eyes daily as needed (dry eyes).   06/03/2021  ? Rimegepant Sulfate (NURTEC) 75 MG TBDP Take 75 mg by mouth daily as needed (migraines).   Past Week  ? SYNTHROID 300 MCG tablet Take 300 mcg by mouth daily before breakfast.   06/03/2021  ? traMADol (ULTRAM) 50 MG tablet Take 50 mg  by mouth 3 (three) times daily as needed (migraines).   Past Month  ? ? ?Royetta Asal, PharmD, BCPS ?06/04/2021 7:06 PM ? ? ?

## 2021-06-04 NOTE — Transfer of Care (Signed)
Immediate Anesthesia Transfer of Care Note ? ?Patient: Amber Moss ? ?Procedure(s) Performed: LAPAROSCOPIC SLEEVE GASTRECTOMY ?UPPER GI ENDOSCOPY ? ?Patient Location: PACU ? ?Anesthesia Type:General ? ?Level of Consciousness: awake, alert  and drowsy ? ?Airway & Oxygen Therapy: Patient Spontanous Breathing and Patient connected to face mask oxygen ? ?Post-op Assessment: Report given to RN, Post -op Vital signs reviewed and stable and Patient moving all extremities X 4 ? ?Post vital signs: Reviewed and stable ? ?Last Vitals:  ?Vitals Value Taken Time  ?BP 165/109 06/04/21 1455  ?Temp    ?Pulse 82 06/04/21 1456  ?Resp 22 06/04/21 1456  ?SpO2 100 % 06/04/21 1456  ?Vitals shown include unvalidated device data. ? ?Last Pain:  ?Vitals:  ? 06/04/21 1144  ?TempSrc:   ?PainSc: 0-No pain  ?   ? ?  ? ?Complications: No notable events documented. ?

## 2021-06-04 NOTE — Progress Notes (Signed)
Rounded on patient with Madlyn Frankel, RN and upon assessing the patient noted that the patient had bleeding on the bed from left lateral incision. Pressure was applied to the patient's incision and initial vital signs taken. BP 84/63 and HR 70 - Patient placed in trendelenburg position. Dr Windle Guard notified by Donella Stade, bleeding continued. Patient stated 'I feel hot', room temp. was decreased. Pressure held on incision for 13 minutes.  Incision continued to bleed.  Dr. Windle Guard notified again by Donella Stade.  Orders received and given to Crystal.  Dr. Kae Heller returned to bedside to assess pt. ?Patient was monitored; vital signs retaken at 1640, BP 123/90, HR 70.  ?Dr. Kae Heller assessed incision.  ?Dr. Windle Guard Injected lidocaine with epi and placed one suture in the incision to create hemostasis.  Hemostasis achieved.  Pressure dressing applied.   ?Will continue to monitor the patient. Will f/u with Dr. Kae Heller as needed.  ? ?

## 2021-06-04 NOTE — Anesthesia Preprocedure Evaluation (Addendum)
Anesthesia Evaluation  ?Patient identified by MRN, date of birth, ID band ?Patient awake ? ? ? ?Reviewed: ?Allergy & Precautions, H&P , NPO status , Patient's Chart, lab work & pertinent test results ? ?History of Anesthesia Complications ?(+) PONV and history of anesthetic complications ? ?Airway ?Mallampati: II ? ? ?Neck ROM: full ? ? ?Comment: Permanent retainer top ? Dental ? ?(+) Teeth Intact, Missing, Dental Advisory Given,  ?  ?Pulmonary ? ?  ?breath sounds clear to auscultation ? ? ? ? ? ? Cardiovascular ?negative cardio ROS ? ? ?Rhythm:regular Rate:Normal ? ? ?  ?Neuro/Psych ? Headaches, PSYCHIATRIC DISORDERS Depression  Neuromuscular disease   ? GI/Hepatic ?GERD  ,  ?Endo/Other  ?Hypothyroidism Morbid obesity ? Renal/GU ?  ? ?  ?Musculoskeletal ? ?(+) Arthritis ,  ? Abdominal ?(+) + obese,   ?Peds ? Hematology ?  ?Anesthesia Other Findings ? ? Reproductive/Obstetrics ? ?  ? ? ? ? ? ? ? ? ? ? ? ? ? ?  ?  ? ? ? ? ? ? ? ?Anesthesia Physical ? ?Anesthesia Plan ? ?ASA: 3 ? ?Anesthesia Plan: General  ? ?Post-op Pain Management:   ? ?Induction: Intravenous ? ?PONV Risk Score and Plan: 4 or greater and Ondansetron, Dexamethasone, Midazolam and Treatment may vary due to age or medical condition ? ?Airway Management Planned: Oral ETT ? ?Additional Equipment: None ? ?Intra-op Plan:  ? ?Post-operative Plan: Extubation in OR ? ?Informed Consent: I have reviewed the patients History and Physical, chart, labs and discussed the procedure including the risks, benefits and alternatives for the proposed anesthesia with the patient or authorized representative who has indicated his/her understanding and acceptance.  ? ? ? ? ? ?Plan Discussed with: CRNA, Anesthesiologist and Surgeon ? ?Anesthesia Plan Comments:   ? ? ? ? ? ? ?Anesthesia Quick Evaluation ? ?

## 2021-06-04 NOTE — Progress Notes (Signed)
Bedside eval due to bleeding noted from left lateral incision with transient hypotension without tachycardia.  ?On assessment pt normotensive, HR 70s, but somnolent due to recent anesthesia and receiving a dose of dilaudid a couple minutes before I arrived.  ?Left lateral incision with steady sanguinoserous drainage despite direct pressure held for about 15 minutes. Subcuticular suture is intact. No surrounding erythema, ecchymosis or hematoma. Area was cleansed with alcohol prep pad and infiltrated with 0.5% lidocaine with epinephrine. Figure of eight 3-0 nylon placed in the skin with good hemostasis achieved. Pressure dressing of gauze and tape applied. Remaining 5 incisions inspected and appear hemostatic without active drainage, no hematoma but there is very faint ecchymosis lateral to the left upper quadrant incision (medial/ anterior to incision just addressed).  ?Will hold this evening's lovenox. Hold toradol. AM CBC unless clinical changes.  ?

## 2021-06-04 NOTE — Anesthesia Procedure Notes (Signed)
Procedure Name: Intubation ?Date/Time: 06/04/2021 1:33 PM ?Performed by: Niel Hummer, CRNA ?Pre-anesthesia Checklist: Patient identified, Emergency Drugs available, Suction available and Patient being monitored ?Patient Re-evaluated:Patient Re-evaluated prior to induction ?Oxygen Delivery Method: Circle system utilized ?Preoxygenation: Pre-oxygenation with 100% oxygen ?Induction Type: IV induction ?Ventilation: Mask ventilation without difficulty ?Laryngoscope Size: Mac and 4 ?Grade View: Grade I ?Tube type: Oral ?Tube size: 7.0 mm ?Number of attempts: 1 ?Airway Equipment and Method: Stylet ?Placement Confirmation: ETT inserted through vocal cords under direct vision, positive ETCO2 and breath sounds checked- equal and bilateral ?Secured at: 23 cm ?Tube secured with: Tape ?Dental Injury: Teeth and Oropharynx as per pre-operative assessment  ? ? ? ? ?

## 2021-06-04 NOTE — Progress Notes (Signed)
?  Transition of Care (TOC) Screening Note ? ? ?Patient Details  ?Name: Amber Moss ?Date of Birth: [birth year] ? ? ?Transition of Care (TOC) CM/SW Contact:    ?Creta Dorame, LCSW ?Phone Number: ?06/04/2021, 3:59 PM ? ? ? ?Transition of Care Department [facility]) has reviewed patient and no TOC needs have been identified at this time. We will continue to monitor patient advancement through interdisciplinary progression rounds. If new patient transition needs arise, please place a TOC consult. ? ? ?

## 2021-06-04 NOTE — Interval H&P Note (Signed)
History and Physical Interval Note: ? ?06/04/2021 ?11:54 AM ? ?Amber Moss  has presented today for surgery, with the diagnosis of morbid obesity.  The various methods of treatment have been discussed with the patient and family. After consideration of risks, benefits and other options for treatment, the patient has consented to  Procedure(s): ?LAPAROSCOPIC SLEEVE GASTRECTOMY (N/A) ?UPPER GI ENDOSCOPY (N/A) as a surgical intervention.  The patient's history has been reviewed, patient examined, no change in status, stable for surgery.  I have reviewed the patient's chart and labs.  Questions were answered to the patient's satisfaction.   ? ? ?Amber Moss Rich Brave ? ? ?

## 2021-06-05 ENCOUNTER — Encounter (HOSPITAL_COMMUNITY): Payer: Self-pay | Admitting: Surgery

## 2021-06-05 LAB — CBC WITH DIFFERENTIAL/PLATELET
Abs Immature Granulocytes: 0.18 10*3/uL — ABNORMAL HIGH (ref 0.00–0.07)
Basophils Absolute: 0 10*3/uL (ref 0.0–0.1)
Basophils Relative: 0 %
Eosinophils Absolute: 2.9 10*3/uL — ABNORMAL HIGH (ref 0.0–0.5)
Eosinophils Relative: 16 %
HCT: 33.2 % — ABNORMAL LOW (ref 36.0–46.0)
Hemoglobin: 11.1 g/dL — ABNORMAL LOW (ref 12.0–15.0)
Immature Granulocytes: 1 %
Lymphocytes Relative: 9 %
Lymphs Abs: 1.5 10*3/uL (ref 0.7–4.0)
MCH: 30.2 pg (ref 26.0–34.0)
MCHC: 33.4 g/dL (ref 30.0–36.0)
MCV: 90.2 fL (ref 80.0–100.0)
Monocytes Absolute: 0.8 10*3/uL (ref 0.1–1.0)
Monocytes Relative: 4 %
Neutro Abs: 12.3 10*3/uL — ABNORMAL HIGH (ref 1.7–7.7)
Neutrophils Relative %: 70 %
Platelets: 255 10*3/uL (ref 150–400)
RBC: 3.68 MIL/uL — ABNORMAL LOW (ref 3.87–5.11)
RDW: 13.9 % (ref 11.5–15.5)
WBC: 17.7 10*3/uL — ABNORMAL HIGH (ref 4.0–10.5)
nRBC: 0 % (ref 0.0–0.2)

## 2021-06-05 LAB — CBC
HCT: 26.4 % — ABNORMAL LOW (ref 36.0–46.0)
Hemoglobin: 8.7 g/dL — ABNORMAL LOW (ref 12.0–15.0)
MCH: 30.7 pg (ref 26.0–34.0)
MCHC: 33 g/dL (ref 30.0–36.0)
MCV: 93.3 fL (ref 80.0–100.0)
Platelets: 251 10*3/uL (ref 150–400)
RBC: 2.83 MIL/uL — ABNORMAL LOW (ref 3.87–5.11)
RDW: 14.2 % (ref 11.5–15.5)
WBC: 13.8 10*3/uL — ABNORMAL HIGH (ref 4.0–10.5)
nRBC: 0 % (ref 0.0–0.2)

## 2021-06-05 LAB — COMPREHENSIVE METABOLIC PANEL
ALT: 24 U/L (ref 0–44)
AST: 36 U/L (ref 15–41)
Albumin: 3.8 g/dL (ref 3.5–5.0)
Alkaline Phosphatase: 49 U/L (ref 38–126)
Anion gap: 10 (ref 5–15)
BUN: 14 mg/dL (ref 6–20)
CO2: 19 mmol/L — ABNORMAL LOW (ref 22–32)
Calcium: 8.4 mg/dL — ABNORMAL LOW (ref 8.9–10.3)
Chloride: 108 mmol/L (ref 98–111)
Creatinine, Ser: 1.36 mg/dL — ABNORMAL HIGH (ref 0.44–1.00)
GFR, Estimated: 49 mL/min — ABNORMAL LOW (ref >60)
Glucose, Bld: 157 mg/dL — ABNORMAL HIGH (ref 70–99)
Potassium: 6.1 mmol/L — ABNORMAL HIGH (ref 3.5–5.1)
Sodium: 137 mmol/L (ref 135–145)
Total Bilirubin: 1.1 mg/dL (ref 0.3–1.2)
Total Protein: 6.5 g/dL (ref 6.5–8.1)

## 2021-06-05 LAB — BASIC METABOLIC PANEL
Anion gap: 8 (ref 5–15)
BUN: 13 mg/dL (ref 6–20)
CO2: 23 mmol/L (ref 22–32)
Calcium: 8 mg/dL — ABNORMAL LOW (ref 8.9–10.3)
Chloride: 107 mmol/L (ref 98–111)
Creatinine, Ser: 1.01 mg/dL — ABNORMAL HIGH (ref 0.44–1.00)
GFR, Estimated: >60 mL/min (ref >60)
Glucose, Bld: 134 mg/dL — ABNORMAL HIGH (ref 70–99)
Potassium: 3.9 mmol/L (ref 3.5–5.1)
Sodium: 138 mmol/L (ref 135–145)

## 2021-06-05 LAB — MAGNESIUM: Magnesium: 2.1 mg/dL (ref 1.7–2.4)

## 2021-06-05 LAB — SURGICAL PATHOLOGY

## 2021-06-05 LAB — TROPONIN I (HIGH SENSITIVITY)
Troponin I (High Sensitivity): 5 ng/L (ref <18)
Troponin I (High Sensitivity): 7 ng/L (ref <18)

## 2021-06-05 MED ORDER — METOPROLOL TARTRATE 5 MG/5ML IV SOLN
5.0000 mg | Freq: Four times a day (QID) | INTRAVENOUS | Status: DC
  Administered 2021-06-05 – 2021-06-06 (×4): 5 mg via INTRAVENOUS
  Filled 2021-06-05 (×4): qty 5

## 2021-06-05 MED ORDER — INSULIN ASPART 100 UNIT/ML IV SOLN
10.0000 [IU] | Freq: Once | INTRAVENOUS | Status: AC
  Administered 2021-06-05: 10 [IU] via INTRAVENOUS

## 2021-06-05 MED ORDER — DEXTROSE 50 % IV SOLN
1.0000 | Freq: Once | INTRAVENOUS | Status: AC
  Administered 2021-06-05: 50 mL via INTRAVENOUS
  Filled 2021-06-05: qty 50

## 2021-06-05 MED ORDER — METOCLOPRAMIDE HCL 5 MG/ML IJ SOLN
10.0000 mg | Freq: Three times a day (TID) | INTRAMUSCULAR | Status: DC
Start: 2021-06-06 — End: 2021-06-06
  Administered 2021-06-06 (×2): 10 mg via INTRAVENOUS
  Filled 2021-06-05 (×2): qty 2

## 2021-06-05 NOTE — Progress Notes (Signed)
Discussed QI "Goals for Discharge" document with patient and family including ambulation in halls, Incentive Spirometry use every hour, and oral care.  Also discussed pain and nausea control.  Enabled or verified head of bed 30 degree alarm activated.  BSTOP education provided including BSTOP information guide, "Guide for Pain Management after your Bariatric Procedure".  Diet progression education provided including "Bariatric Surgery Post-Op Food Plan Phase 1: Liquids".  Questions answered.  Will continue to partner with bedside RN and follow up with patient per protocol.  ?

## 2021-06-05 NOTE — Progress Notes (Signed)
S: Had a decent night, feels like she was out of it most of the night, but in general feels well this morning.  She has the typical upper abdominal soreness but no unusual complaints of abdominal pain or chest pain.  She has completed her water and protein already and denies any nausea or pain associated with drinking.  No reflux or heartburn.  Denies any chest pain, chest pressure, shortness of breath. ? ?O: ?Vitals, labs, intake/output, and orders reviewed at this time.  Tmax 99.  Heart rate 108-111, regular, had been less than 100 until 230 this morning.  Normotensive until earlier this morning, most recent 2 blood pressures have been 170s over 90s.  Saturating 100% on room air.  P.o. 420.  Urine output 1250 (1900-0100, none recorded from 0100-0700).  Lab work this morning notable for potassium 6.1, creatinine 1.36 (preop baseline 0.88, BUN 14 this AM); WBC 17.7 (8.3 preop), hemoglobin 11.1 (14.2 preop), platelets 255 (247 preop) ? ?PRN meds: tramadol x 2 (0221, 3267), oxycodone x 2 (23:52, 1245), robaxin x 1 (0510), dilaudid x 1 (16:59)  ? ?Gen: A&Ox3, no distress and appears well and very comfortable ?H&N: EOMI ?Chest: unlabored respirations, no tachypnea, RRR ?Abd: soft, nontender, nondistended, incision(s) c/d/i without cellulitis or hematoma.  There is faint evolving ecchymosis without hematoma spanning about 5 cm from the 2 left most lateral incisions, again no associated tenderness ?Ext: warm, no edema, no calf tenderness ?Neuro: grossly normal ? ?Lines/tubes/drains: PIV ? ?A/P: Postop day 1 status post uneventful laparoscopic sleeve gastrectomy ? ?-Hyperkalemia, acute kidney injury: Has received insulin dextrose.  EKG with p wave abnormality likely from potassium.  Has no chest pain or chest pressure, very low suspicion for ACS but will check troponins.  Repeat BMP this afternoon, continue IV fluids.  Urine output has been excellent, but patient reports this was one void episode. BUN normal. Perhaps  retaining? Will try I&O x 1. ? ?-Hypertension, low-grade sinus tachycardia: Suspect related to pain, continue to monitor.  Hemoglobin has decreased somewhat this morning, but this is not atypical after sleeve. Low suspicion of active bleeding at this point. We will repeat CBC this afternoon ? ?-Predicted probability of 30-day post discharge VTE/portal mesenteric vein thrombosis is 0.27%, recommendations for discharge is Lovenox 60 mg every 12 for 30 days.  We will continue prophylactic dosing for now (received first dose this morning, held last night due to bleeding from left lateral incision) until follow-up labs and continued observation of clinical course. ? ?-Continue clear liquids, protein shake ?-Ambulate as able, continue SCDs while in bed, continue aggressive pulmonary toilet ? ?Romana Juniper, MD FACS ?[facility] ? ?  ?

## 2021-06-05 NOTE — Progress Notes (Signed)
Patient alert and oriented, Post op day 1.  Provided support and encouragement.  Encouraged pulmonary toilet, ambulation and small sips of liquids.  All questions answered.  Will continue to monitor. 

## 2021-06-05 NOTE — Progress Notes (Signed)
Patient alert and oriented, pain is controlled. Patient is tolerating fluids, advanced to protein shake today, patient is tolerating well.  Reviewed Gastric sleeve discharge instructions with patient and patient is able to articulate understanding.  Provided information on BELT program, Support Group and [facility] outpatient pharmacy. All questions answered, will continue to monitor.  

## 2021-06-05 NOTE — Progress Notes (Signed)
Patient voided in hat 800cc after I/O cath order placed. RN contacted bariatric coordinator to ask if order still needed to be completed. Judie Petit RN contacted MD for this. Awaiting response. ?

## 2021-06-05 NOTE — Progress Notes (Signed)
Verbal given to get lab work through feet and/or IV team assistance. IV team contacted for this. Was told can do if getting blood work from newly placed IV. Orders placed to do so. Patient is stable at this time.  ?

## 2021-06-05 NOTE — Anesthesia Postprocedure Evaluation (Signed)
Anesthesia Post Note ? ?Patient: Amber Moss ? ?Procedure(s) Performed: LAPAROSCOPIC SLEEVE GASTRECTOMY ?UPPER GI ENDOSCOPY ? ?  ? ?Patient location during evaluation: PACU ?Anesthesia Type: General ?Level of consciousness: awake and alert and oriented ?Pain management: pain level controlled ?Vital Signs Assessment: post-procedure vital signs reviewed and stable ?Respiratory status: spontaneous breathing, nonlabored ventilation and respiratory function stable ?Cardiovascular status: blood pressure returned to baseline and stable ?Postop Assessment: no apparent nausea or vomiting ?Anesthetic complications: no ? ? ?No notable events documented. ? ? ? ?  ?  ?  ?  ?  ?  ? ?Murline Weigel A. ? ? ? ? ?

## 2021-06-05 NOTE — Discharge Instructions (Signed)
GASTRIC BYPASS / SLEEVE  ?Home Care Instructions ? ?These instructions are to help you care for yourself when you go home. ? ?Call: If you have any problems. ?Call [phone] and ask for the surgeon on call ?If you have an emergency related to your surgery please use the ER at [facility].  ?Tell the ER staff that you are a new post-op gastric bypass or gastric sleeve patient ?  ?Signs and symptoms to report: Severe vomiting or nausea ?If you cannot handle clear liquids for longer than 1 day, call your surgeon  ?Abdominal pain which does not get better after taking your pain medication ?Fever greater than 100.4? F and chills ?Heart rate over 100 beats a minute ?Trouble breathing ?Chest pain ? Redness, swelling, drainage, or foul odor at incision (surgical) sites ? If your incisions open or pull apart ?Swelling or pain in calf (lower leg) ?Diarrhea (Loose bowel movements that happen often), frequent watery, uncontrolled bowel movements ?Constipation, (no bowel movements for 3 days) if this happens:  ?Take Milk of Magnesia, 2 tablespoons by mouth, 3 times a day for 2 days if needed ?Stop taking Milk of Magnesia once you have had a bowel movement ?Call your doctor if constipation continues ?Or ?Take Miralax  (instead of Milk of Magnesia) following the label instructions ?Stop taking Miralax once you have had a bowel movement ?Call your doctor if constipation continues ?Anything you think is ?abnormal for you? ?  ?Normal side effects after surgery: Unable to sleep at night or unable to concentrate ?Irritability ?Being tearful (crying) or depressed ?These are common complaints, possibly related to your anesthesia, stress of surgery and change in lifestyle, that usually go away a few weeks after surgery.  If these feelings continue, call your medical doctor.  ?Wound Care: You may have surgical glue, steri-strips, or staples over your incisions after surgery ?Surgical glue:  Looks like a clear film over your incisions  and will wear off a little at a time ?Steri-strips : Adhesive strips of tape over your incisions. You may notice a yellowish color on the skin under the steri-strips. This is used to make the   steri-strips stick better. Do not pull the steri-strips off - let them fall off ?Staples: Staples may be removed before you leave the hospital ?If you go home with staples, call [facility] at for an appointment with your surgeon?s nurse to have staples removed 10 days after surgery, [phone] ?Showering: You may shower two (2) days after your surgery unless your surgeon tells you differently ?Wash gently around incisions with warm soapy water, rinse well, and gently pat dry  ?If you have a drain (tube from your incision), you may need someone to hold this while you shower  ?No tub baths until staples are removed and incisions are healed   ?  ?Medications: Medications should be liquid or crushed if larger than the size of a dime ?Extended release pills (medication that releases a little bit at a time through the day) should not be crushed ?Depending on the size and number of medications you take, you may need to space (take a few throughout the day)/change the time you take your medications so that you do not over-fill your pouch (smaller stomach) ?Make sure you follow-up with your primary care physician to make medication changes needed during rapid weight loss and life-style changes ?If you have diabetes, follow up with the doctor that orders your diabetes medication(s) within one week after surgery and check   your blood sugar regularly. ?Do not drive while taking narcotics (pain medications) ?DO NOT take NSAID'S (Examples of NSAID's include ibuprofen, naproxen)  ?Diet:                    First 2 Weeks ? You will see the nutritionist about two (2) weeks after your surgery. The nutritionist will increase the types of foods you can eat if you are handling liquids well: ?If you have severe vomiting or nausea  and cannot handle clear liquids lasting longer than 1 day, call your surgeon  ?Protein Shake ?Drink at least 2 ounces of shake 5-6 times per day ?Each serving of protein shakes (usually 8 - 12 ounces) should have a minimum of:  ?15 grams of protein  ?And no more than 5 grams of carbohydrate  ?Goal for protein each day: ?Men = 80 grams per day ?Women = 60 grams per day ?Protein powder may be added to fluids such as non-fat milk or Lactaid milk or Soy milk (limit to 35 grams added protein powder per serving) ? ?Hydration ?Slowly increase the amount of water and other clear liquids as tolerated (See Acceptable Fluids) ?Slowly increase the amount of protein shake as tolerated  ? Sip fluids slowly and throughout the day ?May use sugar substitutes in small amounts (no more than 6 - 8 packets per day; i.e. Splenda) ? ?Fluid Goal ?The first goal is to drink at least 8 ounces of protein shake/drink per day (or as directed by the nutritionist);  See handout from pre-op Bariatric Education Class for examples of protein shake/drink.   ?Slowly increase the amount of protein shake you drink as tolerated ?You may find it easier to slowly sip shakes throughout the day ?It is important to get your proteins in first ?Your fluid goal is to drink 64 - 100 ounces of fluid daily ?It may take a few weeks to build up to this ?32 oz (or more) should be clear liquids  ?And  ?32 oz (or more) should be full liquids (see below for examples) ?Liquids should not contain sugar, caffeine, or carbonation ? ?Clear Liquids: ?Water or Sugar-free flavored water (i.e. Fruit H2O, Propel) ?Decaffeinated coffee or tea (sugar-free) ?Ermine Stebbins Lite, Wyler?s Lite, Minute Maid Lite ?Sugar-free Jell-O ?Bouillon or broth ?Sugar-free Popsicle:   *Less than 20 calories each; Limit 1 per day ? ?Full Liquids: ?Protein Shakes/Drinks + 2 choices per day of other full liquids ?Full liquids must be: ?No More Than 12 grams of Carbs per serving  ?No More Than 3 grams of Fat  per serving ?Strained low-fat cream soup ?Non-Fat milk ?Fat-free Lactaid Milk ?Sugar-free yogurt (Dannon Lite & Fit, Greek yogurt) ? ? ? ?  ?Vitamins and Minerals Start 1 day after surgery unless otherwise directed by your surgeon ?Bariatric Specific Complete Multivitamins ?Chewable Calcium Citrate with Vitamin D-3 ?(Example: 3 Chewable Calcium Plus 600 with Vitamin D-3) ?Take 500 mg three (3) times a day for a total of 1500 mg each day ?Do not take all 3 doses of calcium at one time as it may cause constipation, and you can only absorb 500 mg  at a time  ?Do not mix multivitamins containing iron with calcium supplements; take 2 hours apart ? ?Menstruating women and those at risk for anemia (a blood disease that causes weakness) may need extra iron ?Talk with your doctor to see if you need more iron ?If you need extra iron: Total daily Iron recommendation (including Vitamins) is 50 to 100   mg Iron/day ?Do not stop taking or change any vitamins or minerals until you talk to your nutritionist or surgeon ?Your nutritionist and/or surgeon must approve all vitamin and mineral supplements ?  ?Activity and Exercise: It is important to continue walking at home.  Limit your physical activity as instructed by your doctor.  During this time, use these guidelines: ?Do not lift anything greater than ten (10) pounds for at least two (2) weeks ?Do not go back to work or drive until your surgeon says you can ?You may have sex when you feel comfortable  ?It is VERY important for female patients to use a reliable birth control method; fertility often increases after surgery  ?Do not get pregnant for at least 18 months ?Start exercising as soon as your doctor tells you that you can ?Make sure your doctor approves any physical activity ?Start with a simple walking program ?Walk 5-15 minutes each day, 7 days per week.  ?Slowly increase until you are walking 30-45 minutes per day ?Consider joining our BELT program. [phone] or email  [email or web address] ?  ?Special Instructions Things to remember: ? ?Use your CPAP when sleeping if this applies to you, do not stop the use of CPAP unless directed by physician after a sleep study ?[place]

## 2021-06-05 NOTE — Progress Notes (Addendum)
Patient has done well today, but HR remains stably mildly elevated to low 100's and she has been persistently hypertensive. Continues to have good UOP and has not required any PRN meds since I saw her this morning. Repeat labs this afternoon demonstrate improved WBC to 13.8, stable plt, but HGB has dropped to 8.7 (11.1 this AM, 14.2 preop). BMP with normalized K+, Creatinine improved to 1.01.  ?Will start scheduled metoprolol. Continue IVF. Hold lovenox. Recheck labs in AM.  ?

## 2021-06-06 LAB — BASIC METABOLIC PANEL
Anion gap: 6 (ref 5–15)
BUN: 8 mg/dL (ref 6–20)
CO2: 24 mmol/L (ref 22–32)
Calcium: 8.3 mg/dL — ABNORMAL LOW (ref 8.9–10.3)
Chloride: 107 mmol/L (ref 98–111)
Creatinine, Ser: 0.94 mg/dL (ref 0.44–1.00)
GFR, Estimated: >60 mL/min (ref >60)
Glucose, Bld: 103 mg/dL — ABNORMAL HIGH (ref 70–99)
Potassium: 5.4 mmol/L — ABNORMAL HIGH (ref 3.5–5.1)
Sodium: 137 mmol/L (ref 135–145)

## 2021-06-06 LAB — MAGNESIUM: Magnesium: 2.3 mg/dL (ref 1.7–2.4)

## 2021-06-06 LAB — CBC
HCT: 25.5 % — ABNORMAL LOW (ref 36.0–46.0)
Hemoglobin: 8.2 g/dL — ABNORMAL LOW (ref 12.0–15.0)
MCH: 30.1 pg (ref 26.0–34.0)
MCHC: 32.2 g/dL (ref 30.0–36.0)
MCV: 93.8 fL (ref 80.0–100.0)
Platelets: 181 10*3/uL (ref 150–400)
RBC: 2.72 MIL/uL — ABNORMAL LOW (ref 3.87–5.11)
RDW: 14.3 % (ref 11.5–15.5)
WBC: 13.6 10*3/uL — ABNORMAL HIGH (ref 4.0–10.5)
nRBC: 0 % (ref 0.0–0.2)

## 2021-06-06 MED ORDER — ONDANSETRON 4 MG PO TBDP: 4.0000 mg | ORAL_TABLET | Freq: Four times a day (QID) | ORAL | 0 refills | Status: AC | PRN

## 2021-06-06 MED ORDER — DOCUSATE SODIUM 100 MG PO CAPS: 100.0000 mg | ORAL_CAPSULE | Freq: Two times a day (BID) | ORAL | 0 refills | Status: AC

## 2021-06-06 MED ORDER — ACETAMINOPHEN 500 MG PO TABS: 1000.0000 mg | ORAL_TABLET | Freq: Three times a day (TID) | ORAL | 0 refills | Status: AC

## 2021-06-06 MED ORDER — PANTOPRAZOLE SODIUM 40 MG PO TBEC: 40.0000 mg | DELAYED_RELEASE_TABLET | Freq: Every day | ORAL | 0 refills | Status: AC

## 2021-06-06 MED ORDER — TRAMADOL HCL 50 MG PO TABS: 50.0000 mg | ORAL_TABLET | Freq: Four times a day (QID) | ORAL | 0 refills | Status: AC | PRN

## 2021-06-06 MED ORDER — GABAPENTIN 100 MG PO CAPS: 200.0000 mg | ORAL_CAPSULE | Freq: Two times a day (BID) | ORAL | 0 refills | Status: AC

## 2021-06-06 MED ORDER — METOPROLOL SUCCINATE ER 25 MG PO TB24: 25.0000 mg | ORAL_TABLET | Freq: Every day | ORAL | 0 refills | Status: AC

## 2021-06-06 NOTE — Progress Notes (Signed)
S: Uneventful night, has some nausea this morning after taking her lexapro. Has been walking in the room. Apparently had some blood on the sheets where her bottom is but no bowel movement or pain. Endorses flatus.  ? ?O: ?Vitals, labs, intake/output, and orders reviewed at this time. Tmax 98.5, HR 83, BP 156/84 (on scheduled metoprolol), sats 98%. PO 480; UOP 1000 + 5 occurrences. BMP- creatinine normalized 0.94, k mildly elevated again at 5.4. WBC down to 13.6, hgb 8.2 (8.7 last night, 11.1 yesterday AM), Plt 181 (251 yesterday).  ? ? ?Gen: A&Ox3, no distress  ?H&N: EOMI, atraumatic, neck supple ?Chest: unlabored respirations, RRR ?Abd: soft, nontender, nondistended, incision(s) c/d/i with steri strips; no cellulitis or hematoma but evolving ecchymosis around L lateral incision spanning 10x15cm ?Ext: warm, no edema ?Neuro: grossly normal ? ?Lines/tubes/drains: PIV ? ?A/P: POD 2 s/p uneventful laparoscopic sleeve gastrectomy ? ?-Hyperkalemia, AKI- improved. UOP excellent. Etiology unclear ? ?-HTN/ low-grade sinus tach- improved on scheduled metoprolol ? ?-Acute blood loss anemia- hgb stabilized this morning. Low clinical suspicion of active bleeding. Will hold off on therapeutic lovenox in this patient given her overall course.  ? ?-Continue clears and protein shakes ?-Ambulate as able, SCDs while in bed, aggressive pulm toilet ? ?-Possible discharge this PM ? ? ?Romana Juniper, MD FACS ?[facility] ? ?  ?

## 2021-06-06 NOTE — Plan of Care (Signed)

## 2021-06-07 ENCOUNTER — Telehealth (HOSPITAL_COMMUNITY): Payer: Self-pay | Admitting: *Deleted

## 2021-06-07 NOTE — Discharge Summary (Signed)
Physician Discharge Summary  ?Amber Moss ITG:[id number] DOB: 24-Jan-1976 DOA: 06/04/2021 ? ?PCP: Rosalee Kaufman, PA-C ? ?Admit date: 06/04/2021 ?Discharge date: 06/06/2021 ? ?Recommendations for Outpatient Follow-up:  ? ? Follow-up Information   ? ? Amber Riley, MD. Amber Moss on 06/27/2021.   ?Specialty: General Surgery ?Why: at 9:20am.  Please arrive 15 minutes prior to your appointment time.  Thank you. ?Contact information: ?[address] ?[address] ?[place] [address] ?[phone] ? ? ?  ?  ? ? Amber Riley, MD. Amber Moss on 07/24/2021.   ?Specialty: General Surgery ?Why: at 9:20am.  Please arrive 15 minutes prior to your appointment time.  Thank you. ?Contact information: ?[address] ?[address] ?[place] [address] ?[phone] ? ? ?  ?  ? ? Surgery, [place]. Go on 06/13/2021.   ?Specialty: General Surgery ?Why: at 10am for a NURSE VISIT to have suture removed from left lateral incision. ?Contact information: ?[address] ?[address] ?[place] [address] ?[phone] ? ? ?  ?  ? ?  ?  ? ?  ? ?Discharge Diagnoses:  ?Principal Problem: ?  Morbid obesity ([place]) ? ? ?Surgical Procedure: Laparoscopic Sleeve Gastrectomy, upper endoscopy ? ?Discharge Condition: Good ?Disposition: Home ? ?Diet recommendation: Postoperative sleeve gastrectomy diet (liquids only) ? ?Filed Weights  ? 06/04/21 1135 06/04/21 1144  ?Weight: (!) 139.7 kg (!) 139.7 kg  ? ? ? ?Hospital Course:  ?The patient was admitted for a planned laparoscopic sleeve gastrectomy. Please see operative note. Preoperatively the patient was given 5000 units of subcutaneous heparin for DVT prophylaxis. Postoperative prophylactic Lovenox dosing was started on the morning of postoperative day 1. ERAS protocol was used. On the evening of postoperative day 0, the patient was started on water and ice chips. On postoperative day 1 the patient had no fever or tachycardia and was tolerating water in their diet was gradually advanced  throughout the day. The patient was ambulating without difficulty. There was a mild AKI after anesthesia and acute blood loss anemia, both of which stabilized by POD 2. She did have oozing from the most lateral left port site requiring bedside suture placement on POD 0 and subsequently moderate ecchymosis developed around this area. Patient also noted to have persistent hypertension post-op which did improve with low dose metoprolol. Otherwise she did well with PO intake and mobility. The patient had received discharge instructions and counseling. They were deemed stable for discharge and had met discharge criteria ? ? ?Discharge Instructions ? ? ?Allergies as of 06/06/2021   ? ?   Reactions  ? Doxycycline Hives, Diarrhea, Nausea And Vomiting  ? Keflex [cephalexin] Hives, Diarrhea, Nausea And Vomiting  ? Baclofen Other (See Comments)  ? Migraine  ? Vancomycin Itching  ? Via IV, by quick infusion only   ? ?  ? ?  ?Medication List  ?  ? ?TAKE these medications   ? ?acetaminophen 500 MG tablet ?Commonly known as: TYLENOL ?Take 2 tablets (1,000 mg total) by mouth every 8 (eight) hours for 5 days. ?  ?Aimovig 140 MG/ML Soaj ?Generic drug: Erenumab-aooe ?Inject 140 mg into the skin every 30 (thirty) days. ?  ?docusate sodium 100 MG capsule ?Commonly known as: Colace ?Take 1 capsule (100 mg total) by mouth 2 (two) times daily. Okay to decrease to once daily or stop taking if having loose bowel movements ?  ?escitalopram 10 MG tablet ?Commonly known as: LEXAPRO ?Take 10 mg by mouth daily. ?  ?gabapentin 100 MG  capsule ?Commonly known as: NEURONTIN ?Take 2 capsules (200 mg total) by mouth every 12 (twelve) hours. ?  ?magnesium oxide 400 MG tablet ?Commonly known as: MAG-OX ?Take 400 mg by mouth at bedtime. ?  ?metoprolol succinate 25 MG 24 hr tablet ?Commonly known as: Toprol XL ?Take 1 tablet (25 mg total) by mouth daily for 10 days. Monitor your blood pressure at least daily while taking this medication. You may need to  stop this medication fairly quickly after bariatric surgery. ?  ?Nurtec 75 MG Tbdp ?Generic drug: Rimegepant Sulfate ?Take 75 mg by mouth daily as needed (migraines). ?  ?ondansetron 4 MG disintegrating tablet ?Commonly known as: ZOFRAN-ODT ?Take 1 tablet (4 mg total) by mouth every 6 (six) hours as needed for nausea or vomiting. ?  ?pantoprazole 40 MG tablet ?Commonly known as: PROTONIX ?Take 1 tablet (40 mg total) by mouth daily. Take this every day regardless of reflux symptoms ?What changed:  ?when to take this ?additional instructions ?  ?promethazine 25 MG tablet ?Commonly known as: PHENERGAN ?Take 25 mg by mouth every 8 (eight) hours as needed for nausea or vomiting. ?  ?Synthroid 300 MCG tablet ?Generic drug: levothyroxine ?Take 300 mcg by mouth daily before breakfast. ?  ?Systane Balance 0.6 % Soln ?Generic drug: Propylene Glycol ?Place 1 drop into both eyes daily as needed (dry eyes). ?  ?traMADol 50 MG tablet ?Commonly known as: ULTRAM ?Take 1 tablet (50 mg total) by mouth every 6 (six) hours as needed (pain). ?What changed:  ?when to take this ?reasons to take this ?  ? ?  ? ? Follow-up Information   ? ? Amber Riley, MD. Amber Moss on 06/27/2021.   ?Specialty: General Surgery ?Why: at 9:20am.  Please arrive 15 minutes prior to your appointment time.  Thank you. ?Contact information: ?[address] ?[address] ?[place] [address] ?[phone] ? ? ?  ?  ? ? Amber Riley, MD. Amber Moss on 07/24/2021.   ?Specialty: General Surgery ?Why: at 9:20am.  Please arrive 15 minutes prior to your appointment time.  Thank you. ?Contact information: ?[address] ?[address] ?[place] [address] ?[phone] ? ? ?  ?  ? ? Surgery, [place]. Go on 06/13/2021.   ?Specialty: General Surgery ?Why: at 10am for a NURSE VISIT to have suture removed from left lateral incision. ?Contact information: ?[address] ?[address] ?[place] [address] ?[phone] ? ? ?  ?  ? ?  ?  ? ?  ? ? ? ?The results of  significant diagnostics from this hospitalization (including imaging, microbiology, ancillary and laboratory) are listed below for reference.   ? ?Significant Diagnostic Studies: ?No results found. ? ?Labs: ?Basic Metabolic Panel: ?Recent Labs  ?Lab 06/05/21 ?0413 06/05/21 ?1711 06/06/21 ?4403  ?NA 137 138 137  ?K 6.1* 3.9 5.4*  ?CL 108 107 107  ?CO2 19* 23 24  ?GLUCOSE 157* 134* 103*  ?BUN '14 13 8  ' ?CREATININE 1.36* 1.01* 0.94  ?CALCIUM 8.4* 8.0* 8.3*  ?MG 2.1  --  2.3  ? ?Liver Function Tests: ?Recent Labs  ?Lab 06/05/21 ?0413  ?AST 36  ?ALT 24  ?ALKPHOS 49  ?BILITOT 1.1  ?PROT 6.5  ?ALBUMIN 3.8  ? ? ?CBC: ?Recent Labs  ?Lab 06/05/21 ?0413 06/05/21 ?1711 06/06/21 ?4742  ?WBC 17.7* 13.8* 13.6*  ?NEUTROABS 12.3*  --   --   ?HGB 11.1* 8.7* 8.2*  ?HCT 33.2* 26.4* 25.5*  ?MCV 90.2 93.3 93.8  ?PLT 255 251  181  ? ? ?CBG: ?Recent Labs  ?Lab 06/04/21 ?1132  ?GLUCAP 94  ? ? ?Principal Problem: ?  Morbid obesity ([place]) ? ? ? ? ?Signed: ? ?Amber Riley MD FACS ?[facility] [place] ?[phone] ?06/07/2021, 8:37 AM ? ?

## 2021-06-07 NOTE — Telephone Encounter (Signed)
1.  Tell me about your pain and pain management? ?Pt denies any pain.  ? ?2.  Let's talk about fluid intake.  How much total fluid are you taking in? ?Pt states that she is working to meet goal of 64 oz of fluid today.  This is her first full day home since discharge. She has a plan to work towards her fluid goal.  Pt encouraged to continue to work towards meeting goal.  Pt instructed to assess status and suggestions daily utilizing Hydration Action Plan on discharge folder and to call [facility] if in the "red zone".  ? ?3.  How much protein have you taken in the last 2 days? ?Pt states that she is working to meet goal of goal of 60g of protein today.  Pt has already consumed one protein water.  Pt plans to drink remainder of protein  (protein water, shakes, and powder) throughout the rest of the day to meet goal. ? ?4.  Have you had nausea?  Tell me about when have experienced nausea and what you did to help? ?Pt denies any current nausea, but has experienced some intermittent nausea.  Pt states that it doesn't last long and that she has not vomited. ?  ?5.  Has the frequency or color changed with your urine? ?Pt states that she is urinating "fine" with no changes in frequency or urgency.   ?  ?6.  Tell me what your incisions look like? ?"Incisions look fine, except for the bruising". Pt denies a fever, chills.  Pt states incisions are not swollen, open, or draining.  Pt encouraged to call [facility] if incisions change or if bruises grow across her abdomen. ?  ?7.  Have you been passing gas? BM? ?Pt states that she has not had a BM.  Pt instructed to take either Miralax or MoM as instructed per "Gastric Bypass/Sleeve Discharge Home Care Instructions".  Pt to call surgeon's office if not able to have BM with medication. ?  ?8.  If a problem or question were to arise who would you call?  Do you know contact numbers for [facility], [facility], and [facility]? ?Pt denies dehydration symptoms.  Pt can describe s/sx of dehydration.  Pt knows to call  [facility] for surgical, [facility] for nutrition, and [facility] for non-urgent questions or concerns. ?  ?9.  How has the walking going? ?Pt states she is walking around and able to be active without difficulty. ?  ?10. Are you still using your incentive spirometer?  If so, how often? ?Pt states that she is doing her I.S. frequently. Pt encouraged to use incentive spirometer, at least 10x every hour while awake until she sees the surgeon. ? ?11.  How are your vitamins and calcium going?  How are you taking them? ?Pt states that she will begin the supplements tomorrow.  Reinforced education about taking supplements at least two hours apart. ? ?Reminded patient that the first 30 days post-operatively are important for successful recovery.  Practice good hand hygiene, wearing a mask when appropriate (since optional in most places), and minimizing exposure to people who live outside of the home, especially if they are exhibiting any respiratory, GI, or illness-like symptoms.   ? ?

## 2021-06-10 ENCOUNTER — Telehealth (HOSPITAL_COMMUNITY): Payer: Self-pay | Admitting: *Deleted

## 2021-06-10 NOTE — Telephone Encounter (Signed)
Pt reached out to share some concerns from over the weekend.  Pt c/o of extreme fatigue and activity intolerance over the weekend.  Pt stated "it took everything in me just to shower and get out".  Pt was discharged with a Hgb of 8.2 and is concerned that it may be "getting worse".  I have spoken with Dr. Kae Heller and Abigail Butts, RN at [facility].  Abigail Butts to f/u with pt to get bloodwork ordered and scheduled.  Will f/u with pt as needed. ?

## 2021-06-18 ENCOUNTER — Encounter: Payer: BC Managed Care – PPO | Attending: Surgery | Admitting: Skilled Nursing Facility1

## 2021-06-18 DIAGNOSIS — Z713 Dietary counseling and surveillance: Secondary | ICD-10-CM | POA: Insufficient documentation

## 2021-06-18 DIAGNOSIS — Z6841 Body Mass Index (BMI) 40.0 and over, adult: Secondary | ICD-10-CM | POA: Diagnosis not present

## 2021-06-19 NOTE — Progress Notes (Signed)
2 Week Post-Operative Nutrition Class ?  ?Patient was seen on 04/ for Post-Operative Nutrition education at the [facility].  ?  ?Surgery date: 06/04/2021 ?Surgery type: sleeve ?Start weight at [facility]: 316.8 ?Weight today: 294.7 ?Bowel Habits: Every day to every other day no complaints ?  ?Body Composition Scale 06/18/2021  ?Current Body Weight 294.7  ?Total Body Fat % 48.8  ?Visceral Fat 20  ?Fat-Free Mass % 51.1  ? Total Body Water % 40.0  ?Muscle-Mass lbs 32.3  ?BMI 50.6  ?Body Fat Displacement   ?       Torso  lbs 89.4  ?       Left Leg  lbs 17.8  ?       Right Leg  lbs 17.8  ?       Left Arm  lbs 8.9  ?       Right Arm   lbs 8.9  ? ? ?  ?The following the learning objectives were met by the patient during this course: ?Identifies Phase 3 (Soft, High Proteins) Dietary Goals and will begin from 2 weeks post-operatively to 2 months post-operatively ?Identifies appropriate sources of fluids and proteins  ?Identifies appropriate fat sources and healthy verses unhealthy fat types   ?States protein recommendations and appropriate sources post-operatively ?Identifies the need for appropriate texture modifications, mastication, and bite sizes when consuming solids ?Identifies appropriate fat consumption and sources ?Identifies appropriate multivitamin and calcium sources post-operatively ?Describes the need for physical activity post-operatively and will follow MD recommendations ?States when to call healthcare provider regarding medication questions or post-operative complications ?  ?Handouts given during class include: ?Phase 3A: Soft, High Protein Diet Handout ?Phase 3 High Protein Meals ?Healthy Fats ?  ?Follow-Up Plan: ?Patient will follow-up at [facility] in 6 weeks for 2 month post-op nutrition visit for diet advancement per MD.  ?

## 2021-06-24 ENCOUNTER — Telehealth: Payer: Self-pay | Admitting: Skilled Nursing Facility1

## 2021-06-24 NOTE — Telephone Encounter (Signed)
RD called pt to verify fluid intake once starting soft, solid proteins 2 week post-bariatric surgery.  ? ?Daily Fluid intake: 40 oz yesterday, pt states she is averaging about 40 oz and is trying really hard to get 64 oz in. ?Daily Protein intake: 60 g ?Bowel Habits: normal ? ?Concerns/issues:  pt states she is struggling with fluid.  Dietitian encouraged to continue sips and mix up the types of fluids/broths. ?

## 2021-07-29 ENCOUNTER — Encounter: Payer: BC Managed Care – PPO | Attending: Surgery | Admitting: Skilled Nursing Facility1

## 2021-07-29 ENCOUNTER — Encounter: Payer: Self-pay | Admitting: Skilled Nursing Facility1

## 2021-07-29 DIAGNOSIS — E669 Obesity, unspecified: Secondary | ICD-10-CM | POA: Diagnosis present

## 2021-07-29 NOTE — Progress Notes (Signed)
Bariatric Nutrition Follow-Up Visit Medical Nutrition Therapy   NUTRITION ASSESSMENT   Appointment conducted virtually. Pt agreeable to limitations of this visit type.  Pt identified by name and date of birth.  Surgery date: 06/04/2021 Surgery type: sleeve Start weight at [facility]: 316.8 Weight today: virtual appointment Bowel Habits: Every day to every other day no complaints   Body Composition Scale 06/18/2021  Current Body Weight 294.7  Total Body Fat % 48.8  Visceral Fat 20  Fat-Free Mass % 51.1   Total Body Water % 40.0  Muscle-Mass lbs 32.3  BMI 50.6  Body Fat Displacement          Torso  lbs 89.4         Left Leg  lbs 17.8         Right Leg  lbs 17.8         Left Arm  lbs 8.9         Right Arm   lbs 8.9   Clinical  Medical hx: PCOS, depression, hyperlipidemia, GERD, kidney stone Medications: Protonix: see list  Labs: none updated in EMR Notable signs/symptoms: migraines Any previous deficiencies? Yes: vitamin D, folate, iron   Lifestyle & Dietary Hx Pt states she really likes edamame  Pt states her husband is a big support and following parts of diet as well. Pt states she is taking stool softener, and states she is excited for non starchy vegetables.  Estimated daily fluid intake: 56 oz Estimated daily protein intake: 60 g Supplements: multi vitamin and calcium Current average weekly physical activity: Walking every night 6 days a week, 1.5 miles    24-Hr Dietary Recall First Meal: Protein shake with coffee Snack:  Second Meal: Tuna or chicken bites with cheese in them (egg chicken and cheese) Snack:  Third Meal: Roast Snack:  Beverages: water with flavorings  Post-Op Goals/ Signs/ Symptoms Using straws: no Drinking while eating: no Chewing/swallowing difficulties: no Changes in vision: no Changes to mood/headaches: no Hair loss/changes to skin/nails: no Difficulty focusing/concentrating: no Sweating: no Limb weakness: no Dizziness/lightheadedness:  no Palpitations: on  Carbonated/caffeinated beverages: no N/V/D/C/Gas: Pt states she started taking stool softeners again Abdominal pain: no Dumping syndrome: no    NUTRITION DIAGNOSIS  Overweight/obesity ([place]genio-3.3) related to past poor dietary habits and physical inactivity as evidenced by completed bariatric surgery and following dietary guidelines for continued weight loss and healthy nutrition status.     NUTRITION INTERVENTION Nutrition counseling (C-1) and education (E-2) to facilitate bariatric surgery goals, including: Diet advancement to the next phase (phase 4) now including non-starchy vegetables The importance of consuming adequate calories as well as certain nutrients daily due to the body's need for essential vitamins, minerals, and fats The importance of daily physical activity and to reach a goal of at least 150 minutes of moderate to vigorous physical activity weekly (or as directed by their physician) due to benefits such as increased musculature and improved lab values The importance of intuitive eating specifically learning hunger-satiety cues and understanding the importance of learning a new body: The importance of mindful eating to avoid grazing behaviors   -Continue to aim for a minimum of 64 fluid ounces 7 days a week with at least 30 ounces being plain water  -Eat non-starchy vegetables 2 times a day 7 days a week  -Start out with soft cooked vegetables today and tomorrow; if tolerated begin to eat raw vegetables or cooked including salads  -Eat your 3 ounces of protein first then start in  on your non-starchy vegetables; once you understand how much of your meal leads to satisfaction and not full while still eating 3 ounces of protein and non-starchy vegetables you can eat them in any order   -Continue to aim for 30 minutes of activity at least 5 times a week  -Do NOT cook with/add to your food: alfredo sauce, cheese sauce, barbeque sauce, ketchup, fat back,  butter, bacon grease, grease, Crisco, OR SUGAR   Handouts Provided Include  Phase 4 Food Plan  Learning Style & Readiness for Change Teaching method utilized: Visual & Auditory  Demonstrated degree of understanding via: Teach Back  Readiness Level: Ready Barriers to learning/adherence to lifestyle change: non identified  RD's Notes for Next Visit Assess adherence to pt chosen goals   MONITORING & EVALUATION Dietary intake, weekly physical activity, body weight  Next Steps Patient is to follow-up in August months for 6 month post-op class.

## 2021-10-15 ENCOUNTER — Ambulatory Visit: Payer: BC Managed Care – PPO

## 2021-10-22 IMAGING — CT CT ANGIO CHEST
2 of 6 series · 19 of 36 positions shown · IV contrast (omnipaque)
Comparison: Radiography 01/26/2020

CLINICAL DATA: Positive D-dimer. Bronchospasm. Hypoxia. Pulseless
episode yesterday.

EXAM:
CT ANGIOGRAPHY CHEST WITH CONTRAST
TECHNIQUE: Multidetector CT imaging of the chest was performed using the
standard protocol during bolus administration of intravenous
contrast. Multiplanar CT image reconstructions and MIPs were
obtained to evaluate the vascular anatomy.
CONTRAST:  60mL OMNIPAQUE IOHEXOL 350 MG/ML SOLN

[Series 7: pe thins · axial · 0.60mm/px · z∈[-309,-64]mm · 18 of 389 slices shown]
[im 20/389  lung]
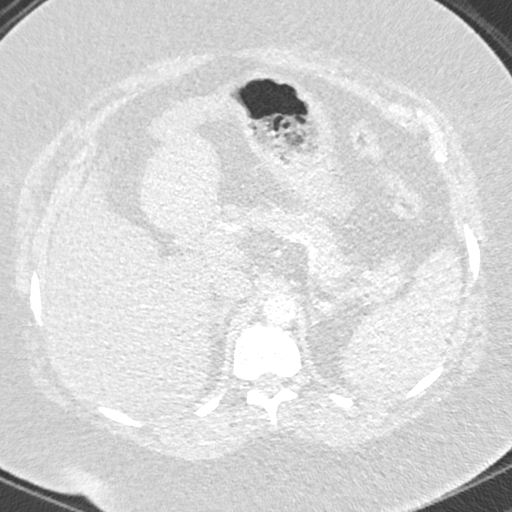
[im 39/389  mediastinal]
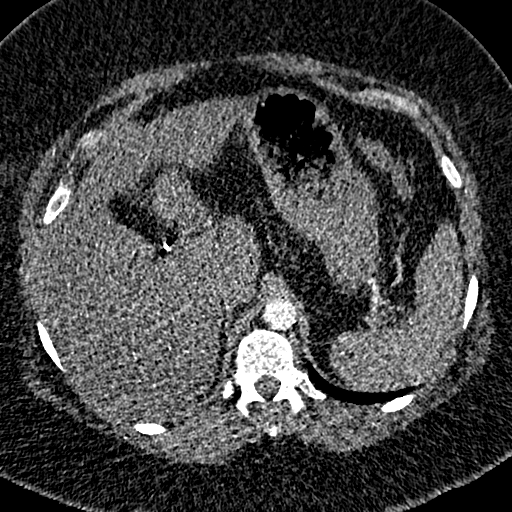
[im 59/389  lung]
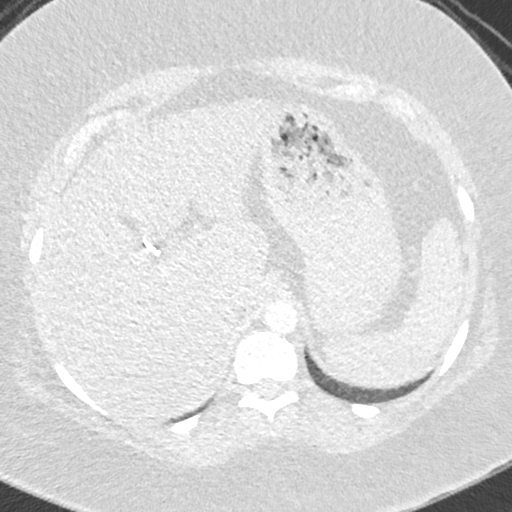
[im 78/389  mediastinal]
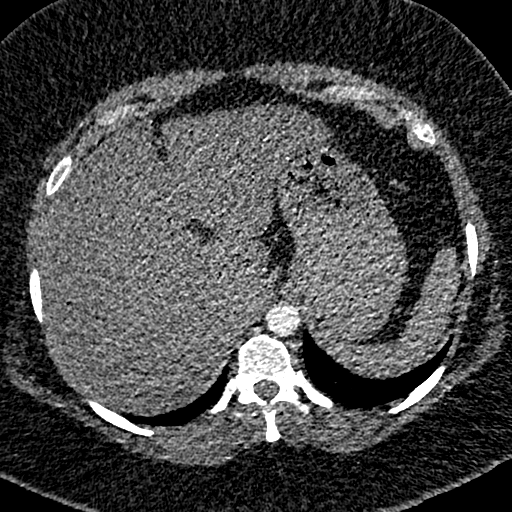
[im 98/389  lung]
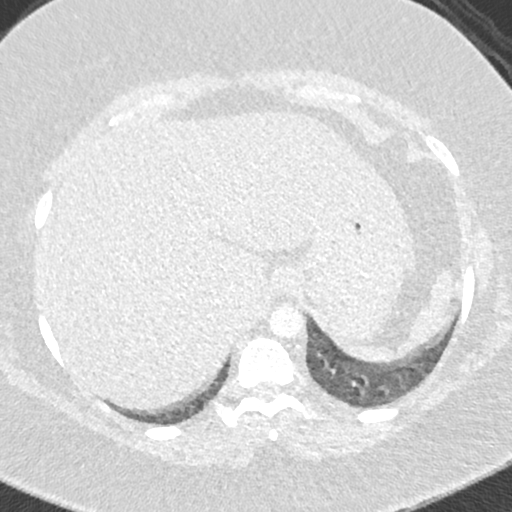
[im 117/389  mediastinal]
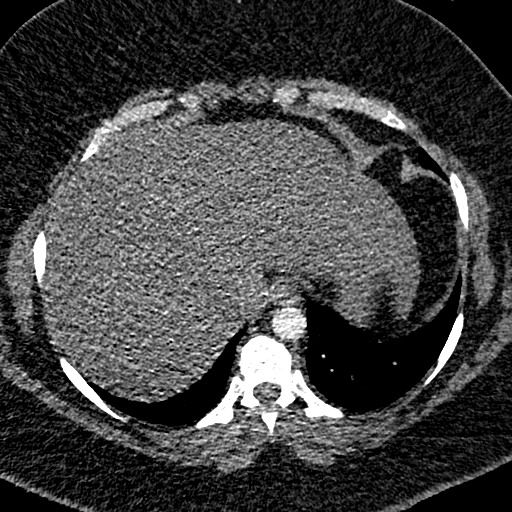
[im 136/389  lung]
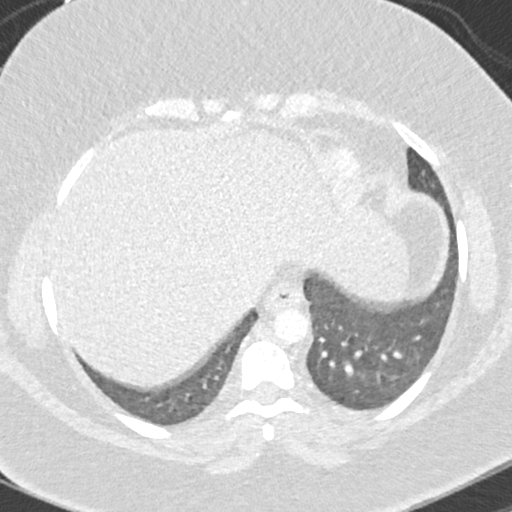
[im 156/389  mediastinal]
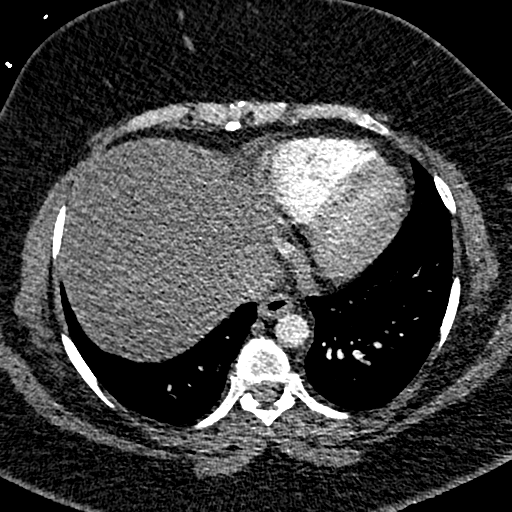
[im 175/389  lung]
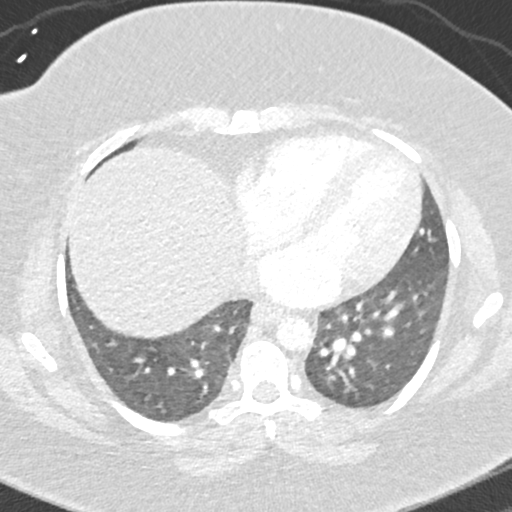
[im 214/389  mediastinal]
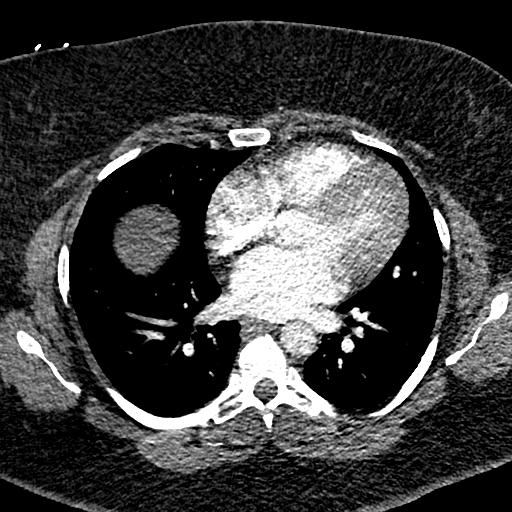
[im 233/389  lung]
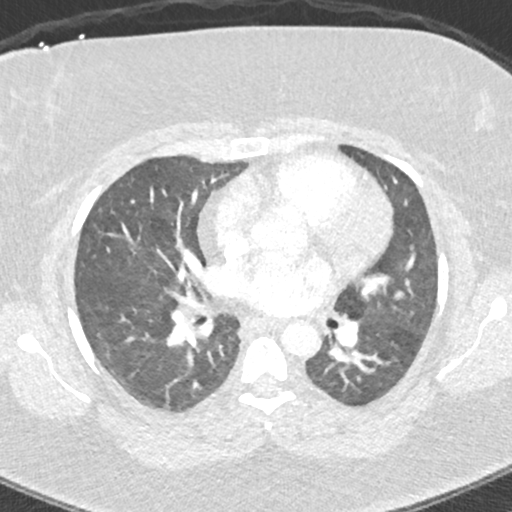
[im 253/389  mediastinal]
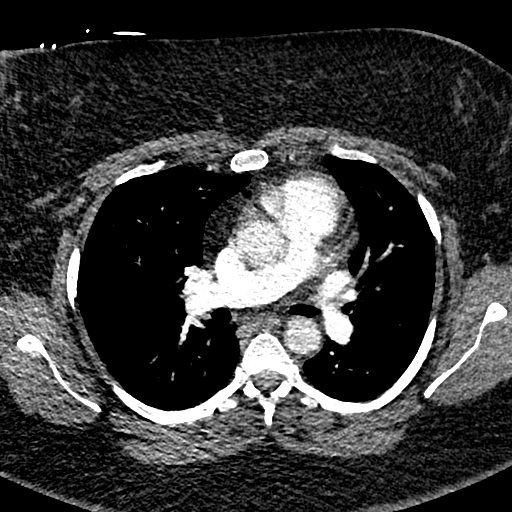
[im 272/389  lung]
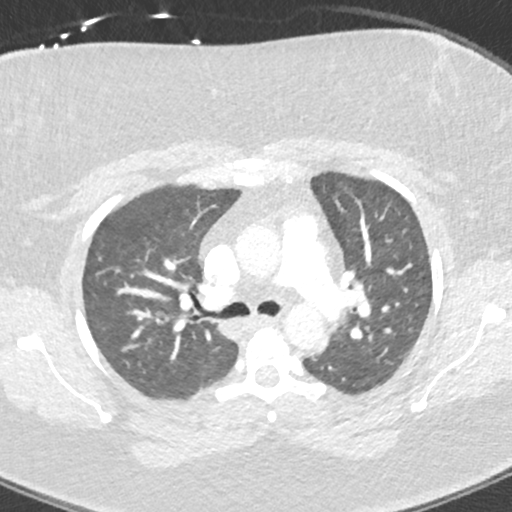
[im 292/389  mediastinal]
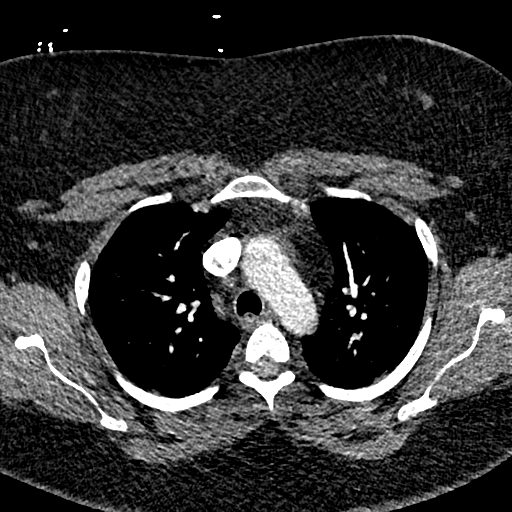
[im 311/389  lung]
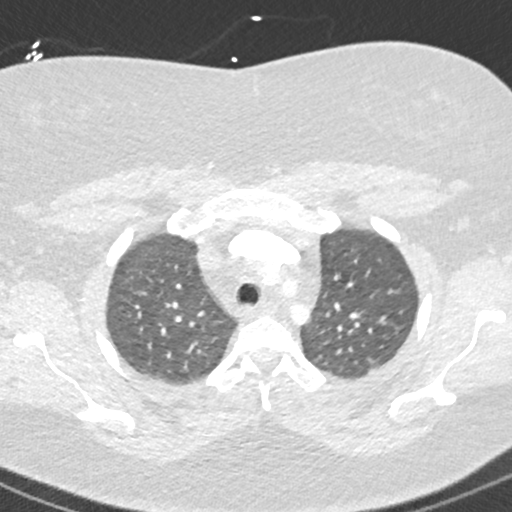
[im 330/389  mediastinal]
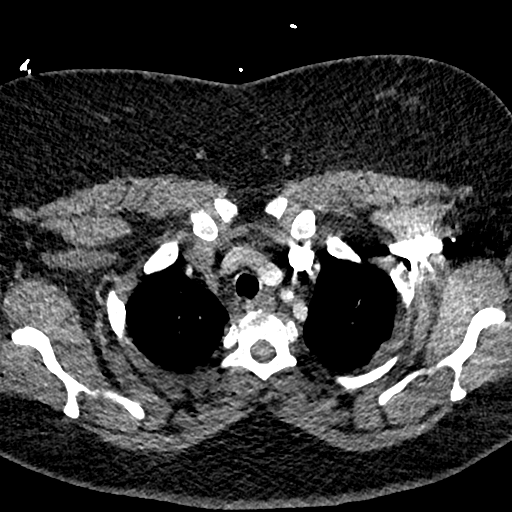
[im 350/389  lung]
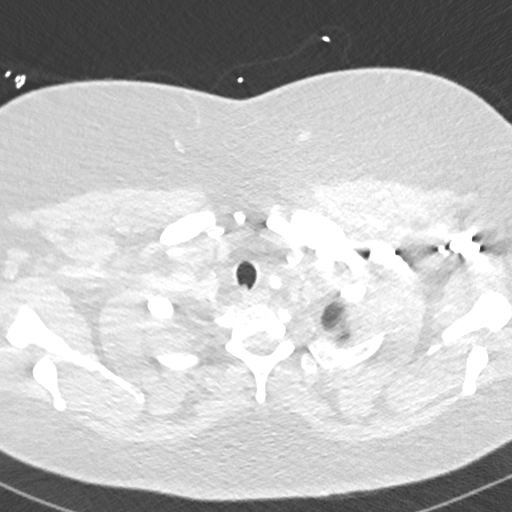
[im 369/389  mediastinal]
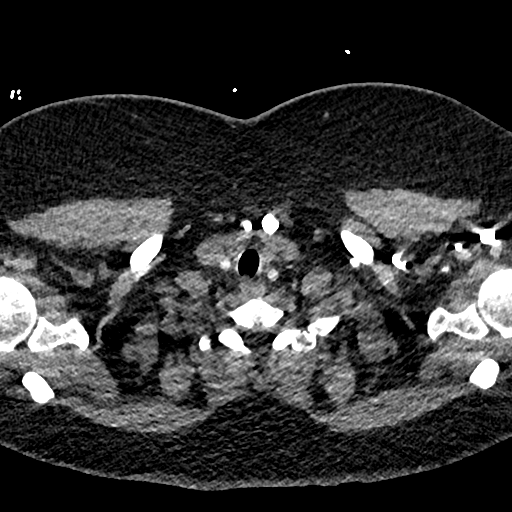

[Series 8: pe 2mm cor · coronal · 0.54mm/px · 1 of 117 slices shown]
[im 59/117  mediastinal]
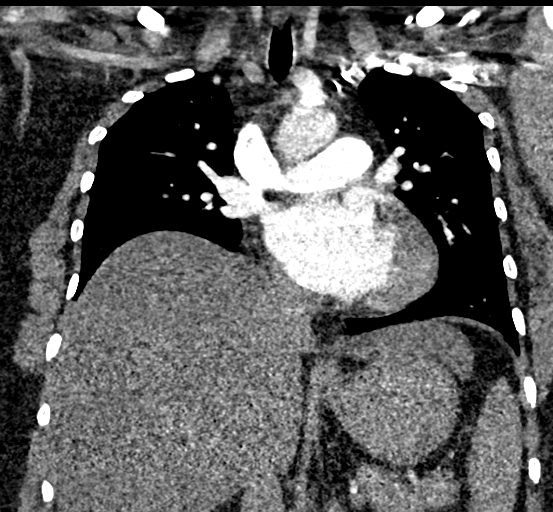

[19 of 36 positions shown; findings below may reference images not displayed]

FINDINGS: Cardiovascular: Pulmonary arterial opacification is good. There are
no pulmonary emboli. Heart size is normal. No pericardial fluid. No
coronary artery calcification. No aortic atherosclerotic
calcification.

Mediastinum/Nodes: Normal

Lungs/Pleura: Normal

Upper Abdomen: No acute finding.  Previous cholecystectomy.

Musculoskeletal: Normal

Review of the MIP images confirms the above findings.
IMPRESSION: Normal examination. No pulmonary emboli or other acute chest
pathology.

## 2021-11-04 IMAGING — RF DG LUMBAR SPINE 1V
1 series · 1 of 1 positions shown · non-contrast
Comparison: 01/23/2020

CLINICAL DATA: L5-S1 revision

EXAM:
LUMBAR SPINE - 1 VIEW; DG C-ARM 1-60 MIN

[Series 1: run · 1 of 1 slices shown]
[im 1/1]
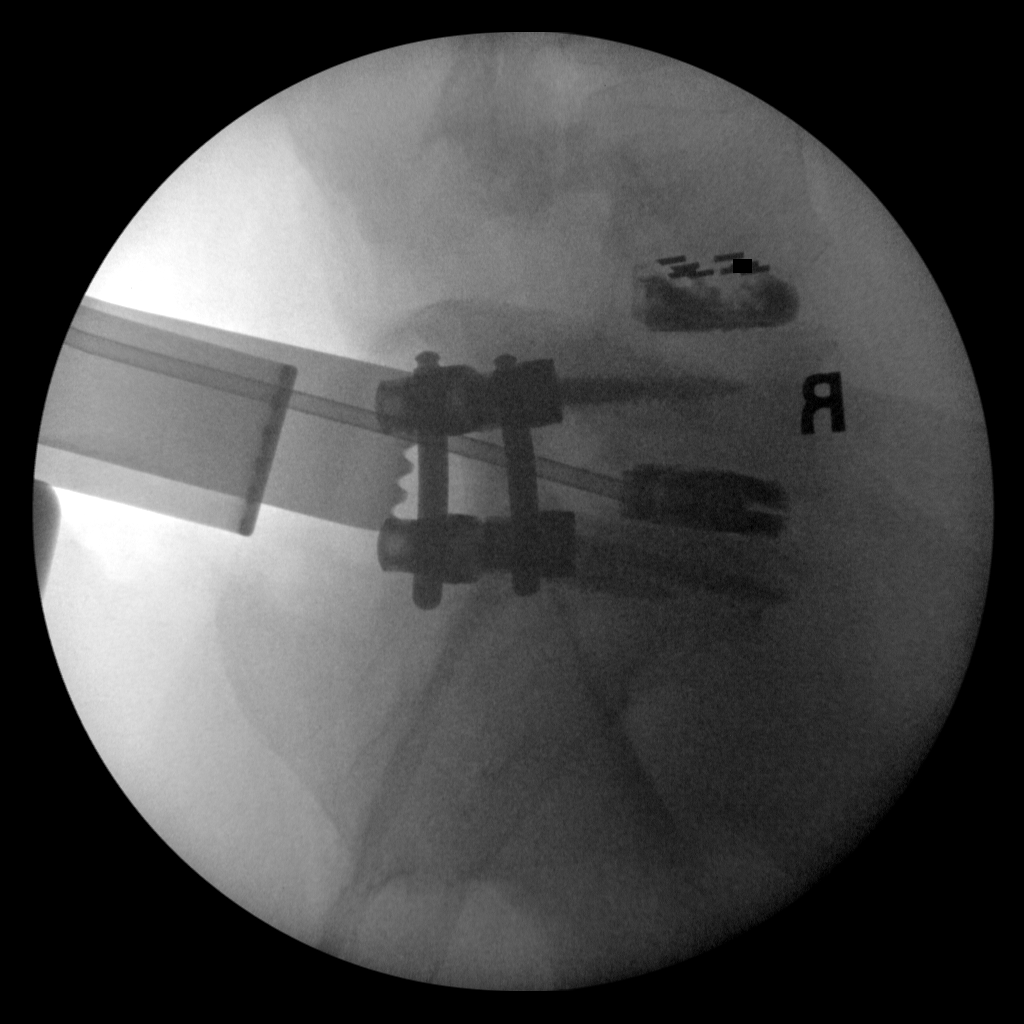

[1 of 1 positions shown; findings below may reference images not displayed]

FINDINGS: A single fluoroscopic images obtained during the performance of the
procedure and provided for interpretation only. Surgical hardware is
seen at the L4-5 and L5/S1 levels. Alignment is grossly anatomic.
Please refer to the operative report.

FLUOROSCOPY TIME:  12.0 seconds
IMPRESSION: 1. Intraoperative exam as above.

## 2021-11-18 ENCOUNTER — Encounter: Payer: Self-pay | Admitting: Dietician

## 2021-11-18 ENCOUNTER — Encounter: Payer: BC Managed Care – PPO | Attending: Surgery | Admitting: Dietician

## 2021-11-18 DIAGNOSIS — E669 Obesity, unspecified: Secondary | ICD-10-CM

## 2021-11-18 NOTE — Progress Notes (Signed)
Bariatric Nutrition Follow-Up Visit Medical Nutrition Therapy   NUTRITION ASSESSMENT   Appointment conducted virtually. Pt agreeable to limitations of this visit type.  Pt identified by name and date of birth.  Surgery date: 06/04/2021 Surgery type: sleeve Start weight at [facility]: 316.8 Weight today: virtual appointment Bowel Habits: Every day to every other day no complaints   Body Composition Scale 06/18/2021 11/18/2021  Current Body Weight 294.7 265.3  Total Body Fat % 48.8 46.7  Visceral Fat 20 17  Fat-Free Mass % 51.1 53.2   Total Body Water % 40.0 41.1  Muscle-Mass lbs 32.3 31.9  BMI 50.6 45.4  Body Fat Displacement           Torso  lbs 89.4 76.9         Left Leg  lbs 17.8 15.3         Right Leg  lbs 17.8 15.3         Left Arm  lbs 8.9 7.6         Right Arm   lbs 8.9 7.6   Clinical  Medical hx: PCOS, depression, hyperlipidemia, GERD, kidney stone Medications: Protonix: see list  Labs: none updated in EMR Notable signs/symptoms: migraines Any previous deficiencies? Yes: vitamin D, folate, iron   Lifestyle & Dietary Hx  Pt states she thinks she is in a stall. Pt  states she is following the food phase. Pt states she had something spicy and drank fluid with her food and it did not feel good, stating she tried carbonation too, and it did not feel good.   Estimated daily fluid intake: 80 oz Estimated daily protein intake: 60 g Supplements: multi vitamin and calcium Current average weekly physical activity: gym 4-5 days a week, about an hour  24-Hr Dietary Recall First Meal: Protein shake with 8 oz coffee Snack: Harriet Masson (one serving) or almond crackers Second Meal: Deli meat or left overs from night before.  Snack: greek yogurt with peanut butter or almond crackers Third Meal: chicken, green beans or hamburger patty with cheese Snack: yogurt Beverages: water with flavorings  Post-Op Goals/ Signs/ Symptoms Using straws: no Drinking while eating:  no Chewing/swallowing difficulties: no Changes in vision: no Changes to mood/headaches: no Hair loss/changes to skin/nails: no Difficulty focusing/concentrating: no Sweating: no Limb weakness: no Dizziness/lightheadedness: no Palpitations: on  Carbonated/caffeinated beverages: no N/V/D/C/Gas: Pt states she started taking stool softeners again Abdominal pain: no Dumping syndrome: no    NUTRITION DIAGNOSIS  Overweight/obesity ([place]-3.3) related to past poor dietary habits and physical inactivity as evidenced by completed bariatric surgery and following dietary guidelines for continued weight loss and healthy nutrition status.     NUTRITION INTERVENTION Nutrition counseling (C-1) and education (E-2) to facilitate bariatric surgery goals, including: Diet advancement to the next phase (phase 5) now including all vegetables The importance of consuming adequate calories as well as certain nutrients daily due to the body's need for essential vitamins, minerals, and fats The importance of daily physical activity and to reach a goal of at least 150 minutes of moderate to vigorous physical activity weekly (or as directed by their physician) due to benefits such as increased musculature and improved lab values The importance of intuitive eating specifically learning hunger-satiety cues and understanding the importance of learning a new body: The importance of mindful eating to avoid grazing behaviors   -Continue to aim for a minimum of 64 fluid ounces 7 days a week with at least 30 ounces being plain water  -Eat non-starchy  vegetables 2 times a day 7 days a week  -Eat your 3 ounces of protein first then start in on your non-starchy vegetables; once you understand how much of your meal leads to satisfaction and not full while still eating 3 ounces of protein and non-starchy vegetables you can eat them in any order   -Continue to aim for 30 minutes of activity at least 5 times a week  -Do NOT cook  with/add to your food: alfredo sauce, cheese sauce, barbeque sauce, ketchup, fat back, butter, bacon grease, grease, Crisco, OR SUGAR    Information Reviewed/ Discussed During Appointment: -Review of composition scale numbers -Fluid requirements (64-100 ounces) -Protein requirements (60-80g) -Strategies for tolerating diet -Advancement of diet to include Starchy vegetables -Inclusion of appropriate multivitamin and calcium supplements  -Exercise recommendations   Handouts Provided Include  Phase 4 Food Plan  Learning Style & Readiness for Change Teaching method utilized: Visual & Auditory  Demonstrated degree of understanding via: Teach Back  Readiness Level: Ready Barriers to learning/adherence to lifestyle change: non identified  RD's Notes for Next Visit Assess adherence to pt chosen goals   MONITORING & EVALUATION Dietary intake, weekly physical activity, body weight  Next Steps Patient is to follow-up in December/January for 9 month post-op follow-up.

## 2023-01-08 ENCOUNTER — Encounter (HOSPITAL_COMMUNITY): Payer: Self-pay | Admitting: *Deleted

## 2023-12-11 ENCOUNTER — Other Ambulatory Visit: Payer: Self-pay

## 2023-12-11 ENCOUNTER — Other Ambulatory Visit (HOSPITAL_BASED_OUTPATIENT_CLINIC_OR_DEPARTMENT_OTHER): Payer: Self-pay

## 2023-12-11 MED ORDER — SYNTHROID 150 MCG PO TABS
150.0000 ug | ORAL_TABLET | Freq: Every morning | ORAL | 0 refills | Status: DC
  Filled 2023-12-11: qty 30, 30d supply, fill #0

## 2023-12-11 MED ORDER — ESCITALOPRAM OXALATE 10 MG PO TABS
10.0000 mg | ORAL_TABLET | Freq: Every day | ORAL | 0 refills | Status: DC
  Filled 2023-12-11: qty 30, 30d supply, fill #0

## 2024-01-05 ENCOUNTER — Encounter (HOSPITAL_COMMUNITY): Payer: Self-pay | Admitting: *Deleted

## 2024-01-21 ENCOUNTER — Other Ambulatory Visit (HOSPITAL_BASED_OUTPATIENT_CLINIC_OR_DEPARTMENT_OTHER): Payer: Self-pay

## 2024-01-22 ENCOUNTER — Other Ambulatory Visit (HOSPITAL_BASED_OUTPATIENT_CLINIC_OR_DEPARTMENT_OTHER): Payer: Self-pay

## 2024-01-22 MED ORDER — SYNTHROID 150 MCG PO TABS
150.0000 ug | ORAL_TABLET | Freq: Every morning | ORAL | 0 refills | Status: AC
  Filled 2024-01-22: qty 90, 90d supply, fill #0
  Filled 2024-01-25: qty 30, 30d supply, fill #0
  Filled 2024-02-19: qty 30, 30d supply, fill #1
  Filled 2024-04-05: qty 30, 30d supply, fill #2

## 2024-01-22 MED ORDER — ESCITALOPRAM OXALATE 10 MG PO TABS
10.0000 mg | ORAL_TABLET | Freq: Every day | ORAL | 0 refills | Status: DC
  Filled 2024-01-22: qty 30, 30d supply, fill #0

## 2024-01-25 ENCOUNTER — Other Ambulatory Visit: Payer: Self-pay

## 2024-01-25 ENCOUNTER — Other Ambulatory Visit (HOSPITAL_BASED_OUTPATIENT_CLINIC_OR_DEPARTMENT_OTHER): Payer: Self-pay

## 2024-01-25 ENCOUNTER — Other Ambulatory Visit (HOSPITAL_COMMUNITY): Payer: Self-pay

## 2024-02-18 ENCOUNTER — Other Ambulatory Visit (HOSPITAL_BASED_OUTPATIENT_CLINIC_OR_DEPARTMENT_OTHER): Payer: Self-pay

## 2024-02-18 MED ORDER — AIMOVIG 140 MG/ML ~~LOC~~ SOAJ
140.0000 mg | SUBCUTANEOUS | 1 refills | Status: AC
  Filled 2024-02-18: qty 1, 30d supply, fill #0

## 2024-02-19 ENCOUNTER — Other Ambulatory Visit: Payer: Self-pay

## 2024-02-19 ENCOUNTER — Other Ambulatory Visit (HOSPITAL_BASED_OUTPATIENT_CLINIC_OR_DEPARTMENT_OTHER): Payer: Self-pay

## 2024-02-19 MED ORDER — ESCITALOPRAM OXALATE 10 MG PO TABS
10.0000 mg | ORAL_TABLET | Freq: Every day | ORAL | 0 refills | Status: DC
  Filled 2024-02-19: qty 30, 30d supply, fill #0

## 2024-02-22 ENCOUNTER — Other Ambulatory Visit (HOSPITAL_BASED_OUTPATIENT_CLINIC_OR_DEPARTMENT_OTHER): Payer: Self-pay

## 2024-02-23 ENCOUNTER — Other Ambulatory Visit (HOSPITAL_BASED_OUTPATIENT_CLINIC_OR_DEPARTMENT_OTHER): Payer: Self-pay

## 2024-02-29 ENCOUNTER — Other Ambulatory Visit (HOSPITAL_BASED_OUTPATIENT_CLINIC_OR_DEPARTMENT_OTHER): Payer: Self-pay

## 2024-02-29 MED ORDER — MELOXICAM 15 MG PO TABS
15.0000 mg | ORAL_TABLET | Freq: Every day | ORAL | 0 refills | Status: AC
  Filled 2024-02-29: qty 30, 30d supply, fill #0

## 2024-03-04 ENCOUNTER — Other Ambulatory Visit (HOSPITAL_BASED_OUTPATIENT_CLINIC_OR_DEPARTMENT_OTHER): Payer: Self-pay

## 2024-03-04 MED ORDER — ESCITALOPRAM OXALATE 20 MG PO TABS
20.0000 mg | ORAL_TABLET | Freq: Every day | ORAL | 0 refills | Status: AC
  Filled 2024-03-04: qty 90, 90d supply, fill #0

## 2024-03-04 MED ORDER — AIMOVIG 140 MG/ML ~~LOC~~ SOAJ
140.0000 mg | SUBCUTANEOUS | 1 refills | Status: AC
  Filled 2024-03-23: qty 1, 30d supply, fill #0

## 2024-03-04 MED ORDER — PANTOPRAZOLE SODIUM 40 MG PO TBEC: 40.0000 mg | DELAYED_RELEASE_TABLET | Freq: Every day | ORAL | 0 refills | Status: AC | PRN

## 2024-03-04 MED ORDER — NURTEC 75 MG PO TBDP: 75.0000 mg | ORAL_TABLET | Freq: Every day | ORAL | 2 refills | Status: AC

## 2024-03-04 MED ORDER — IBUPROFEN 800 MG PO TABS: 800.0000 mg | ORAL_TABLET | Freq: Three times a day (TID) | ORAL | 0 refills | Status: AC | PRN

## 2024-03-04 MED ORDER — AIMOVIG 140 MG/ML ~~LOC~~ SOAJ: 140.0000 mg | SUBCUTANEOUS | 4 refills | Status: AC

## 2024-03-04 MED ORDER — ROPINIROLE HCL 0.25 MG PO TABS: 0.2500 mg | ORAL_TABLET | Freq: Every day | ORAL | 0 refills | Status: AC

## 2024-03-22 ENCOUNTER — Other Ambulatory Visit (HOSPITAL_BASED_OUTPATIENT_CLINIC_OR_DEPARTMENT_OTHER): Payer: Self-pay

## 2024-03-22 MED ORDER — PREDNISONE 20 MG PO TABS
40.0000 mg | ORAL_TABLET | Freq: Every morning | ORAL | 0 refills | Status: AC
  Filled 2024-03-22: qty 10, 5d supply, fill #0

## 2024-03-24 ENCOUNTER — Other Ambulatory Visit (HOSPITAL_BASED_OUTPATIENT_CLINIC_OR_DEPARTMENT_OTHER): Payer: Self-pay

## 2024-04-05 ENCOUNTER — Encounter: Payer: Self-pay | Admitting: Pharmacist

## 2024-04-05 ENCOUNTER — Other Ambulatory Visit: Payer: Self-pay

## 2024-04-07 ENCOUNTER — Other Ambulatory Visit (HOSPITAL_COMMUNITY): Payer: Self-pay

## 2024-04-07 ENCOUNTER — Other Ambulatory Visit: Payer: Self-pay

## 2024-04-07 ENCOUNTER — Other Ambulatory Visit (HOSPITAL_BASED_OUTPATIENT_CLINIC_OR_DEPARTMENT_OTHER): Payer: Self-pay

## 2024-04-07 MED ORDER — LISDEXAMFETAMINE DIMESYLATE 10 MG PO CAPS
10.0000 mg | ORAL_CAPSULE | Freq: Every morning | ORAL | 0 refills | Status: AC
  Filled 2024-04-07: qty 30, 30d supply, fill #0

## 2024-04-08 ENCOUNTER — Other Ambulatory Visit: Payer: Self-pay

## 2024-04-08 ENCOUNTER — Other Ambulatory Visit (HOSPITAL_COMMUNITY): Payer: Self-pay

## 2024-04-08 MED ORDER — SYNTHROID 150 MCG PO TABS
150.0000 ug | ORAL_TABLET | Freq: Every morning | ORAL | 5 refills | Status: AC
  Filled 2024-04-08 – 2024-04-10 (×3): qty 30, 30d supply, fill #0
  Filled 2024-04-11: qty 90, 90d supply, fill #0

## 2024-04-08 MED ORDER — ESCITALOPRAM OXALATE 10 MG PO TABS: 10.0000 mg | ORAL_TABLET | Freq: Every day | ORAL | 5 refills | Status: AC

## 2024-04-09 ENCOUNTER — Other Ambulatory Visit (HOSPITAL_COMMUNITY): Payer: Self-pay

## 2024-04-10 ENCOUNTER — Other Ambulatory Visit: Payer: Self-pay

## 2024-04-11 ENCOUNTER — Other Ambulatory Visit (HOSPITAL_COMMUNITY): Payer: Self-pay

## 2024-04-11 ENCOUNTER — Other Ambulatory Visit: Payer: Self-pay
